# Patient Record
Sex: Female | Born: 1935 | Race: White | Hispanic: No | Marital: Married | Smoking: Never smoker
Health system: Southern US, Community
[De-identification: ages and names within clinical notes are randomized; demographics above are authoritative.]

## PROBLEM LIST (undated history)

## (undated) DIAGNOSIS — F0390 Unspecified dementia without behavioral disturbance: Secondary | ICD-10-CM

## (undated) DIAGNOSIS — I10 Essential (primary) hypertension: Secondary | ICD-10-CM

## (undated) DIAGNOSIS — E7800 Pure hypercholesterolemia, unspecified: Secondary | ICD-10-CM

## (undated) DIAGNOSIS — G4733 Obstructive sleep apnea (adult) (pediatric): Secondary | ICD-10-CM

## (undated) DIAGNOSIS — G20A1 Parkinson's disease without dyskinesia, without mention of fluctuations: Secondary | ICD-10-CM

## (undated) DIAGNOSIS — M549 Dorsalgia, unspecified: Secondary | ICD-10-CM

## (undated) DIAGNOSIS — G20 Parkinson's disease: Secondary | ICD-10-CM

## (undated) HISTORY — DX: Essential (primary) hypertension: I10

## (undated) HISTORY — DX: Dorsalgia, unspecified: M54.9

## (undated) HISTORY — DX: Pure hypercholesterolemia, unspecified: E78.00

## (undated) HISTORY — PX: LUMBAR SPINE SURGERY: SHX701

## (undated) HISTORY — DX: Parkinson's disease without dyskinesia, without mention of fluctuations: G20.A1

## (undated) HISTORY — PX: OTHER SURGICAL HISTORY: SHX169

## (undated) HISTORY — DX: Parkinson's disease: G20

## (undated) HISTORY — PX: SHOULDER SURGERY: SHX246

## (undated) HISTORY — DX: Obstructive sleep apnea (adult) (pediatric): G47.33

## (undated) HISTORY — PX: CERVICAL SPINE SURGERY: SHX589

---

## 1998-02-16 ENCOUNTER — Ambulatory Visit (HOSPITAL_COMMUNITY): Admission: RE | Admit: 1998-02-16 | Discharge: 1998-02-16 | Payer: Self-pay | Admitting: Endocrinology

## 1998-10-06 ENCOUNTER — Ambulatory Visit (HOSPITAL_COMMUNITY): Admission: RE | Admit: 1998-10-06 | Discharge: 1998-10-06 | Payer: Self-pay | Admitting: Endocrinology

## 1998-10-06 ENCOUNTER — Encounter: Payer: Self-pay | Admitting: Endocrinology

## 1998-10-12 ENCOUNTER — Ambulatory Visit (HOSPITAL_COMMUNITY): Admission: RE | Admit: 1998-10-12 | Discharge: 1998-10-12 | Payer: Self-pay | Admitting: *Deleted

## 1998-10-14 ENCOUNTER — Other Ambulatory Visit: Admission: RE | Admit: 1998-10-14 | Discharge: 1998-10-14 | Payer: Self-pay | Admitting: Endocrinology

## 1998-11-25 ENCOUNTER — Observation Stay (HOSPITAL_COMMUNITY): Admission: RE | Admit: 1998-11-25 | Discharge: 1998-11-27 | Payer: Self-pay | Admitting: Orthopedic Surgery

## 1998-12-18 ENCOUNTER — Encounter: Payer: Self-pay | Admitting: Emergency Medicine

## 1998-12-18 ENCOUNTER — Encounter: Payer: Self-pay | Admitting: Neurosurgery

## 1998-12-18 ENCOUNTER — Inpatient Hospital Stay (HOSPITAL_COMMUNITY): Admission: EM | Admit: 1998-12-18 | Discharge: 1999-01-10 | Payer: Self-pay | Admitting: Emergency Medicine

## 1998-12-19 ENCOUNTER — Encounter: Payer: Self-pay | Admitting: Emergency Medicine

## 1998-12-19 ENCOUNTER — Encounter: Payer: Self-pay | Admitting: Neurosurgery

## 1998-12-21 ENCOUNTER — Encounter: Payer: Self-pay | Admitting: Neurosurgery

## 1998-12-28 ENCOUNTER — Encounter: Payer: Self-pay | Admitting: Neurosurgery

## 1998-12-29 ENCOUNTER — Encounter: Payer: Self-pay | Admitting: Neurosurgery

## 1999-01-08 ENCOUNTER — Encounter: Payer: Self-pay | Admitting: Neurosurgery

## 1999-10-26 ENCOUNTER — Encounter: Payer: Self-pay | Admitting: Endocrinology

## 1999-10-26 ENCOUNTER — Ambulatory Visit (HOSPITAL_COMMUNITY): Admission: RE | Admit: 1999-10-26 | Discharge: 1999-10-26 | Payer: Self-pay | Admitting: Endocrinology

## 1999-11-14 ENCOUNTER — Other Ambulatory Visit: Admission: RE | Admit: 1999-11-14 | Discharge: 1999-11-14 | Payer: Self-pay | Admitting: Endocrinology

## 2000-07-18 ENCOUNTER — Encounter: Payer: Self-pay | Admitting: Orthopedic Surgery

## 2000-07-18 ENCOUNTER — Encounter: Admission: RE | Admit: 2000-07-18 | Discharge: 2000-07-18 | Payer: Self-pay | Admitting: Orthopedic Surgery

## 2000-08-01 ENCOUNTER — Ambulatory Visit (HOSPITAL_COMMUNITY): Admission: RE | Admit: 2000-08-01 | Discharge: 2000-08-01 | Payer: Self-pay | Admitting: Orthopedic Surgery

## 2000-08-01 ENCOUNTER — Encounter: Payer: Self-pay | Admitting: Orthopedic Surgery

## 2000-12-11 ENCOUNTER — Other Ambulatory Visit: Admission: RE | Admit: 2000-12-11 | Discharge: 2000-12-11 | Payer: Self-pay | Admitting: Endocrinology

## 2002-04-02 ENCOUNTER — Ambulatory Visit (HOSPITAL_COMMUNITY): Admission: RE | Admit: 2002-04-02 | Discharge: 2002-04-02 | Payer: Self-pay | Admitting: Endocrinology

## 2002-04-02 ENCOUNTER — Encounter: Payer: Self-pay | Admitting: Endocrinology

## 2002-07-02 ENCOUNTER — Encounter: Payer: Self-pay | Admitting: Orthopedic Surgery

## 2002-07-02 ENCOUNTER — Encounter: Admission: RE | Admit: 2002-07-02 | Discharge: 2002-07-02 | Payer: Self-pay | Admitting: Orthopedic Surgery

## 2002-12-01 ENCOUNTER — Encounter: Payer: Self-pay | Admitting: Orthopedic Surgery

## 2002-12-03 ENCOUNTER — Inpatient Hospital Stay (HOSPITAL_COMMUNITY): Admission: RE | Admit: 2002-12-03 | Discharge: 2002-12-06 | Payer: Self-pay | Admitting: Orthopedic Surgery

## 2002-12-03 ENCOUNTER — Encounter: Payer: Self-pay | Admitting: Orthopedic Surgery

## 2003-04-28 ENCOUNTER — Ambulatory Visit (HOSPITAL_COMMUNITY): Admission: RE | Admit: 2003-04-28 | Discharge: 2003-04-28 | Payer: Self-pay | Admitting: Endocrinology

## 2003-04-28 ENCOUNTER — Encounter: Payer: Self-pay | Admitting: Endocrinology

## 2003-06-03 ENCOUNTER — Ambulatory Visit (HOSPITAL_COMMUNITY): Admission: RE | Admit: 2003-06-03 | Discharge: 2003-06-03 | Payer: Self-pay | Admitting: *Deleted

## 2003-06-03 ENCOUNTER — Encounter (INDEPENDENT_AMBULATORY_CARE_PROVIDER_SITE_OTHER): Payer: Self-pay | Admitting: Specialist

## 2003-06-24 ENCOUNTER — Encounter: Admission: RE | Admit: 2003-06-24 | Discharge: 2003-06-24 | Payer: Self-pay | Admitting: Orthopedic Surgery

## 2003-12-23 ENCOUNTER — Encounter: Admission: RE | Admit: 2003-12-23 | Discharge: 2003-12-23 | Payer: Self-pay | Admitting: Orthopedic Surgery

## 2004-03-17 ENCOUNTER — Ambulatory Visit (HOSPITAL_COMMUNITY): Admission: RE | Admit: 2004-03-17 | Discharge: 2004-03-17 | Payer: Self-pay | Admitting: Specialist

## 2004-03-25 ENCOUNTER — Encounter: Admission: RE | Admit: 2004-03-25 | Discharge: 2004-03-25 | Payer: Self-pay | Admitting: Endocrinology

## 2004-04-07 ENCOUNTER — Encounter (INDEPENDENT_AMBULATORY_CARE_PROVIDER_SITE_OTHER): Payer: Self-pay | Admitting: *Deleted

## 2004-04-07 ENCOUNTER — Ambulatory Visit: Admission: RE | Admit: 2004-04-07 | Discharge: 2004-04-07 | Payer: Self-pay | Admitting: Internal Medicine

## 2004-05-06 ENCOUNTER — Ambulatory Visit (HOSPITAL_COMMUNITY): Admission: RE | Admit: 2004-05-06 | Discharge: 2004-05-06 | Payer: Self-pay | Admitting: Endocrinology

## 2004-05-11 ENCOUNTER — Encounter: Admission: RE | Admit: 2004-05-11 | Discharge: 2004-05-11 | Payer: Self-pay | Admitting: Orthopedic Surgery

## 2004-07-18 ENCOUNTER — Inpatient Hospital Stay (HOSPITAL_COMMUNITY): Admission: RE | Admit: 2004-07-18 | Discharge: 2004-07-19 | Payer: Self-pay | Admitting: Orthopedic Surgery

## 2004-10-14 ENCOUNTER — Encounter: Admission: RE | Admit: 2004-10-14 | Discharge: 2004-10-14 | Payer: Self-pay | Admitting: Orthopedic Surgery

## 2005-04-21 ENCOUNTER — Encounter: Admission: RE | Admit: 2005-04-21 | Discharge: 2005-04-21 | Payer: Self-pay | Admitting: Orthopedic Surgery

## 2005-05-12 ENCOUNTER — Ambulatory Visit (HOSPITAL_COMMUNITY): Admission: RE | Admit: 2005-05-12 | Discharge: 2005-05-12 | Payer: Self-pay | Admitting: Endocrinology

## 2006-01-12 ENCOUNTER — Encounter: Admission: RE | Admit: 2006-01-12 | Discharge: 2006-01-12 | Payer: Self-pay | Admitting: Orthopedic Surgery

## 2006-07-17 ENCOUNTER — Ambulatory Visit (HOSPITAL_COMMUNITY): Admission: RE | Admit: 2006-07-17 | Discharge: 2006-07-17 | Payer: Self-pay | Admitting: *Deleted

## 2006-07-17 ENCOUNTER — Encounter (INDEPENDENT_AMBULATORY_CARE_PROVIDER_SITE_OTHER): Payer: Self-pay | Admitting: *Deleted

## 2006-11-20 ENCOUNTER — Encounter: Admission: RE | Admit: 2006-11-20 | Discharge: 2006-11-20 | Payer: Self-pay | Admitting: Orthopedic Surgery

## 2007-10-04 ENCOUNTER — Ambulatory Visit (HOSPITAL_COMMUNITY): Admission: RE | Admit: 2007-10-04 | Discharge: 2007-10-04 | Payer: Self-pay | Admitting: Neurosurgery

## 2007-10-29 ENCOUNTER — Inpatient Hospital Stay (HOSPITAL_COMMUNITY): Admission: RE | Admit: 2007-10-29 | Discharge: 2007-11-01 | Payer: Self-pay | Admitting: Neurosurgery

## 2007-10-31 ENCOUNTER — Ambulatory Visit: Payer: Self-pay | Admitting: Physical Medicine & Rehabilitation

## 2007-11-01 ENCOUNTER — Inpatient Hospital Stay (HOSPITAL_COMMUNITY)
Admission: RE | Admit: 2007-11-01 | Discharge: 2007-11-12 | Payer: Self-pay | Admitting: Physical Medicine & Rehabilitation

## 2010-09-18 ENCOUNTER — Encounter: Payer: Self-pay | Admitting: Cardiology

## 2010-09-18 ENCOUNTER — Encounter: Payer: Self-pay | Admitting: Neurosurgery

## 2011-01-05 ENCOUNTER — Emergency Department (HOSPITAL_COMMUNITY)
Admission: EM | Admit: 2011-01-05 | Discharge: 2011-01-05 | Disposition: A | Discharge status: Home / Self Care | Payer: Medicare Other | Attending: Emergency Medicine | Admitting: Emergency Medicine

## 2011-01-05 ENCOUNTER — Emergency Department (HOSPITAL_COMMUNITY): Payer: Medicare Other

## 2011-01-05 DIAGNOSIS — W108XXA Fall (on) (from) other stairs and steps, initial encounter: Secondary | ICD-10-CM | POA: Insufficient documentation

## 2011-01-05 DIAGNOSIS — W050XXA Fall from non-moving wheelchair, initial encounter: Secondary | ICD-10-CM | POA: Insufficient documentation

## 2011-01-05 DIAGNOSIS — R51 Headache: Secondary | ICD-10-CM | POA: Insufficient documentation

## 2011-01-05 DIAGNOSIS — S0180XA Unspecified open wound of other part of head, initial encounter: Secondary | ICD-10-CM | POA: Insufficient documentation

## 2011-01-05 DIAGNOSIS — G20A1 Parkinson's disease without dyskinesia, without mention of fluctuations: Secondary | ICD-10-CM | POA: Insufficient documentation

## 2011-01-05 DIAGNOSIS — Z79899 Other long term (current) drug therapy: Secondary | ICD-10-CM | POA: Insufficient documentation

## 2011-01-05 DIAGNOSIS — Y92009 Unspecified place in unspecified non-institutional (private) residence as the place of occurrence of the external cause: Secondary | ICD-10-CM | POA: Insufficient documentation

## 2011-01-05 DIAGNOSIS — S12100A Unspecified displaced fracture of second cervical vertebra, initial encounter for closed fracture: Secondary | ICD-10-CM | POA: Insufficient documentation

## 2011-01-05 DIAGNOSIS — M542 Cervicalgia: Secondary | ICD-10-CM | POA: Insufficient documentation

## 2011-01-05 DIAGNOSIS — G20 Parkinson's disease: Secondary | ICD-10-CM | POA: Insufficient documentation

## 2011-01-05 DIAGNOSIS — E789 Disorder of lipoprotein metabolism, unspecified: Secondary | ICD-10-CM | POA: Insufficient documentation

## 2011-01-10 NOTE — Op Note (Signed)
Kathleen Haynes, TURSKI NO.:  [id number]   MEDICAL RECORD NO.:  [id number]          PATIENT TYPE:  INP   LOCATION:  3103                         FACILITY:  [facility]   PHYSICIAN:  Danae Orleans. Venetia Maxon, M.D.  DATE OF BIRTH:  May 23, 1927   DATE OF PROCEDURE:  10/29/2007  DATE OF DISCHARGE:                               OPERATIVE REPORT   PREOPERATIVE DIAGNOSIS:  Scoliosis, spondylolysis, spondylolisthesis,  status post prior decompressive laminectomy, and lumbar radiculopathy L3  through S1 levels.   POSTOPERATIVE DIAGNOSIS:  Scoliosis, spondylolysis, spondylolisthesis,  status post prior decompressive laminectomy, and lumbar radiculopathy L3  through S1 levels.   PROCEDURES:  1. Redo decompression, L3 through S1.  2. Transforaminal lumbar interbody fusion, L5-S1, with PEEK interbody      cage, morcellized autograft bone, morphogenic protein and Fortoss.  3. Pedicle screw fixation bilaterally, L3 through S1, using segmental      instrumentation.  4. Posterolateral arthrodesis with Fortoss, autograft and BMP   SURGEON:  Danae Orleans. Venetia Maxon, M.D.   ASSISTANT:  Georgiann Cocker, RN   ANESTHESIA:  General endotracheal anesthesia.   ESTIMATED BLOOD LOSS:  400 mL with 125 mL of Cell Saver blood returned  to the patient.   COMPLICATIONS:  None.   DISPOSITION:  To recovery.   INDICATIONS:  Tresea Heine is a 77 year old woman with lumbar  scoliosis, spondylolysis of L5, spondylolisthesis of L5 on S1 grade 2,  with severe right L5 radiculopathy.  With recurrent stenosis, she has  previously undergone decompressive laminectomy by another physician and  has spondylosis, stenosis and scoliosis as well as recurrent nerve root  compression.  It was elected take her surgery for decompression and  fusion.   PROCEDURE:  Ms. Bridgers was brought to the operating room.  Following  satisfactory and uncomplicated induction of general endotracheal  anesthesia and placement of intravenous lines  and Foley catheter with  arterial line, she was placed in a prone position on the [place] table.  Her low back was then prepped and draped in the usual sterile fashion.  Previous incision was ellipsed out and exposure was carried to the L3  transverse processes, L4, L5 transverse processes and the sacral alae,  and retraction was performed with the cerebellar retractors.  The  previous densely scarred-in prior laminectomy defect was re-explored  after confirmatory x-ray confirmed correct orientation with marker  probes at each of these bony prominences.  There was spondylolysis of L5  and using laminar spreaders it was possible to gain access to the  pedicles of L5 and S1 and to reopen this interspace and under loupe  magnification and painstaking dissection through scar tissue, the right  L5 nerve root was identified and decompressed of dense scar and  ligamentous tissue.  The nerve root appeared to have been severely  compressed by a combination of the grade 2 spondylolisthesis as well as  herniated disk material and with decompression, there appeared to be  significantly increased room for this nerve to exit the spinal canal.  The foramen was totally unroofed and then the interspace at L5-S1 was  incised with  a 15 blade and disk material was removed in a vigorous  fashion, stripping cartilaginous material from the endplates of L5 and  S1 and removing the herniated disk material to totally decompress the  nerve roots.  After trial sizing an 8-mm medium PEEK interbody cage was  selected, packed with half of a medium strip of BMP.  It was then packed  with Fortoss overlying it.  Then the additional half stripped of BMP was  placed within the interspace and additional Fortoss was placed overlying  this.  The implant was then placed in the interspace and countersunk  appropriately.  There appeared to be significant decompression along the  nerve roots with this implant.  Additional bone  graft and Fortoss was  placed overlying the implant and this was tamped in position.  The  further laminectomy was then performed, particularly on the right, to  decompress the L3, L4, L5 nerve roots along the lateral recess.  Subsequently pedicle screw fixation was placed using 6.5 x 40-mm screws  at the sacrum and 5.5 x by ranging from 40 to 50-mm screws at L5, L4 and  L3 levels.  Prior to placing the screws the posterolateral region was  decorticated and after placing these screws and confirming their  positioning on AP and lateral fluoroscopy, the remaining BMP was placed  in the posterolateral region and additional Fortoss and bone autograft  was placed overlying this.  The 70-mm prelordosed rods were fashioned to  fit these screw heads, locked down in situ, and the self-retaining  retractor removed and the incision closed with Vicryl subcuticular  stitch.  The wound was dressed with Benzoin, Steri-Strips, Telfa gauze  and tape.  The patient was extubated in the operating room taken to  recovery having tolerated the procedure well.  Counts were correct at  the end of the case.      Danae Orleans. Venetia Maxon, M.D.  Electronically Signed     JDS/MEDQ  D:  10/29/2007  T:  10/30/2007  Job:  [id number]   cc:   Georgiann Cocker, RN

## 2011-01-10 NOTE — Discharge Summary (Signed)
NAMEVALARIA, KOHUT NO.:  [id number]   MEDICAL RECORD NO.:  [id number]          PATIENT TYPE:  IPS   LOCATION:  4032                         FACILITY:  [facility]   PHYSICIAN:  Ellwood Dense, M.D.   DATE OF BIRTH:  [birth year]   DATE OF ADMISSION:  11/01/2007  DATE OF DISCHARGE:  11/12/2007                               DISCHARGE SUMMARY   DISCHARGE DIAGNOSES:  1. Redo lumbar L3-S1 decompression fusion for spondylosis and      radiculopathy October 29, 2007.  2. History of lumbar back surgery x2.  3. Parkinson's disease.  4. Hypertension.  5. Hyperlipidemia.  6. Pain management.   This is a 77 year old white female with history of Parkinson's disease,  lumbar back surgery in April 2004 as well as a right lateral  decompression in 2005, who was admitted on October 29, 2007 with increased  low back pain.  X-rays and imaging showed scoliosis, spondylosis, and  lumbar radiculopathy.  Underwent redo L3-S1, redo decompression lumbar  interbody fusion, lumbar L5-S1 with pedicle screw fixation bilaterally  L3-S1 on October 29, 2007 per Dr. Venetia Maxon.  PCA discontinued October 31, 2007.  Noted brace when out of bed.   PAST MEDICAL HISTORY:  See discharge diagnoses.   No alcohol or tobacco.   ALLERGIES:  Sulfa and Biaxin.   SOCIAL HISTORY:  She lives with her husband.  Assistance is needed.  Good local family support, one-level home, four steps to entry.   MEDICATIONS:  Prior to admission were Darvocet as needed, calcium twice  daily, Hygroton 50-25 daily, potassium chloride 20 mEq daily, Detrol 4  mg daily, Crestor 10 mg daily, Sinemet 50-200 mg twice daily, ReQuip 2  mg daily, and Zantac over the counter.   REHABILITATION AND HOSPITAL COURSE:  The patient was admitted to  inpatient rehab services with therapies initiated on a 3-hour daily  basis consisting of physical therapy, occupational therapy, and  rehabilitation nursing.  The following issues were addressed during  patient's rehabilitation stay.  Pertaining to Ms. Rohner's redo lumbar  back surgery, lumbar L3-S1 for scoliosis radiculopathy on October 29, 2007.  Surgical site healing nicely.  No signs of infection.  She is wearing a  back brace when out of bed.  She had undergone lumbar back surgery x2 in  the past.  Functionally, she was minimal assist for car transfers,  ambulating minimal assist 50 feet with a rolling walker, minimal assist  transfers, minimal assist for lower body activities of daily living,  supervision for upper body in the sitting position.  She was continent  for bowel and bladder.  She was using Vicodin on limited basis for pain.  She did have some subtle complaints of low back discomfort.  Pelvis  films to rule out any degenerative changes of the SI joint, were  negative for acute findings.  No fracture noted.  She remained on her  Sinemet and ReQuip for Parkinson's disease, I wish she would follow up  with Dr. Sandria Manly.  Blood pressure is well controlled.  She was maintained  on Hygroton.  She remained on her Crestor  for hyperlipidemia.  She was  using Flexeril on a limited basis for some muscle spasms.  Her calves  remained cool without any swelling, erythema, and nontender.   Latest lab showed a sodium 138, potassium 3.1 with supplement added, BUN  19, creatinine of 0.8, hemoglobin of 9, hematocrit 25.6, and platelet  331,000.   DISCHARGE MEDICATIONS:  At the time of dictation included Crestor 10 mg  daily, Detrol 4 mg daily, ReQuip 2 mg daily, Hygroton 25 mg daily, Os-  Cal twice daily, Sinemet 50/200 mg 1 tablet every 12 hours, Trinsicon 1  capsule twice daily, Tenoretic 20-25 daily, Protonix 40 mg daily,  Vicodin 5-325 one or two tablets every 4 hours as needed pain, dispensed  with 60 tablets, Flexeril 10 mg every 6 hours as needed for muscle  spasms.  Diet was regular.   SPECIAL INSTRUCTIONS:  Continue back brace when out of bed.  Follow up  with Dr. Maeola Harman on  [phone], Neurosurgery.  Follow up with Dr.  Juleen China, medical management.      Mariam Dollar, P.A.    ______________________________  Ellwood Dense, M.D.    DA/MEDQ  D:  11/12/2007  T:  11/12/2007  Job:  [id number]   cc:   Danae Orleans. Venetia Maxon, M.D.  Genene Churn. Love, M.D.  Georgiana Spinner, M.D.  Ellwood Dense, M.D.  Brooke Bonito, M.D.

## 2011-01-10 NOTE — H&P (Signed)
Kathleen Haynes, BASTEDO NO.:  [id number]   MEDICAL RECORD NO.:  [id number]          PATIENT TYPE:  IPS   LOCATION:  4142                         FACILITY:  [facility]   PHYSICIAN:  Ellwood Dense, M.D.   DATE OF BIRTH:  1926-09-06   DATE OF ADMISSION:  11/01/2007  DATE OF DISCHARGE:                              HISTORY & PHYSICAL   HISTORY AND PHYSICAL ADMIT NOTE TO REHABILITATION UNIT   ADMITTING PHYSICIAN:  Unknown.   PRIMARY CARE PHYSICIAN:  Brooke Bonito, M.D.   NEUROSURGEONDanae Orleans. Venetia Maxon, M.D.   NEUROLOGIST:  Genene Churn. Love, M.D.   GASTROENTEROLOGY:  Georgiana Spinner, M.D.   HISTORY OF PRESENT ILLNESS:  [name] is a 77 year old Caucasian female  with history of Parkinson's disease.  She had a prior L2-L5  decompressive laminectomy in April 2004, as well as a right lateral  decompression at L2-3 and L3-L4 in November 2005.  The patient was  admitted October 29, 2007 with increased low back pain.  X-rays showed  scoliosis along with spondylosis and lumbar radiculopathy.  The patient underwent redo L3-S1 decompressive laminectomy with  interbody fusion, L5-S1 using pedicle screw fixation.  That was per  performed by Dr. Venetia Maxon October 29, 2007.  A PCA pump was discontinued October 31, 2007.  She has a brace when out of bed and don and doff that brace at  bedside.   The patient was evaluated by the rehabilitation physicians and felt to  be an appropriate candidate for inpatient rehabilitation.   REVIEW OF SYSTEMS:  Positive for reflux.   PAST MEDICAL HISTORY:  1. Parkinson's disease.  2. Hypertension.  3. Dyslipidemia.  4. Back surgery in 2004 and 2005.  5. Left patellar fracture December 2008.   FAMILY HISTORY:  Positive for coronary artery disease.   SOCIAL HISTORY:  The patient lives with her husband who can assist as  needed.  There is good local family support.  They live in a one-level  home with four steps to enter.  The patient does not use alcohol or  tobacco.   FUNCTIONAL HISTORY PRIOR TO ADMISSION:  Independent, using a walker for  the last several months.   ALLERGIES:  BIAXIN, SULFA.   MEDICATION PRIOR TO ADMISSION:  1. Darvocet p.r.n.  2. Calcium b.i.d.  3. Hygroton 50/25 1 tablet daily.  4. Potassium chloride 20 mEq daily.  5. Detrol LA 4 mg daily.  6. Crestor 10 mg daily.  7. Sinemet 50/200 b.i.d.  8. Requip 2 mg q.i.d. daily.   LABORATORY:  Recent hemoglobin was 12.2, hematocrit 35.5, platelet count  of 229,000, white count of 4.8.  Recent sodium is 138, potassium 3.6,  chloride 103, bicarbonate 25, BUN 26 and creatinine 1.06.   PHYSICAL EXAM:  GENERAL:  Well-appearing, thin, elderly female, up using  a walker with her brace on.  VITAL SIGNS:  Blood pressure is 110/70 with pulse 80, respiratory rate  20 and temperature 98.0.  HEENT:  Normocephalic, nontraumatic.  CARDIOVASCULAR:  Regular rate and rhythm, S1-S2 without murmurs.  ABDOMEN:  Soft, nontender with positive  bowel sounds.  LUNGS:  Clear to auscultation bilaterally.  NEUROLOGIC:  Alert and oriented x3.  Cranial nerves II-XII are intact.  Bilateral upper extremity exam showed 4-/5 strength throughout.  Bulk,  tone were normal.  Reflexes 2+ and symmetrical.  Lower extremity exam  showed 4-/5 strength in hip flexion/extension, ankle dorsiflexion.  The  patient has a well-healing lumbar wound without drainage.   IMPRESSION:  1. Status post redo L3 - S1 decompression, with fusion for spondylosis      with myelopathy, performed by Dr. Venetia Maxon October 29, 2007.  2. History of lumbar back surgery x2.  3. Parkinson's disease on Sinemet and Requip.  4. Hypertension on Tenormin and Hygroton.  5. Dyslipidemia.   Presently, the patient has deficits in ADLs, transfers, and ambulation  related to the above-noted lumbar surgery.   PLAN:  1. Admit to the rehabilitation of daily therapies to include physical      therapy for range of motion, strengthening, bed mobility,       transfers, pre-gait training, gait training and equipment eval.  2. Occupational therapy for range of motion, strengthening, ADLs,      cognitive/perceptual training, splinting and equipment eval, rehab      nursing for skin care, wound care and bound bladder training as      necessary.  3. Case management to assess home environment, assist with discharge      planning, and arrange for appropriate follow-up care.  4. Social work to assess family and social support and assist in      discharge planning.  5. Check admission labs including CBC and CMET Monday November 04, 2007.      Continue regular diet.  6. Back brace when out of bed and may don and doff at the edge of the      bed.  7. Senokot-S 2 tablets p.o. q.h.s.  8. Zocor 40 mg p.o. daily.  9. Detrol LA 4 mg p.o. q.  10.Potassium chloride 20 mEq p.o. daily.  11.Sinemet CR 50/200 1 tablet b.i.d. at 1000 and 2200 daily.  12.Os-Cal with vitamin D 5 mg p.o. b.i.d.  13.Requip 2 mg p.o. daily.  14.Tenormin 50 mg p.o. daily.  15.Hygroton 25 mg p.o. daily.  16.Discontinue IV fluids if not already done.  17.Flexeril 10 mg p.o. q.6 h p.r.n. for pain.  18.Vicodin 5/500 1-2 tablets p.o. q.4 h. p.r.n. for pain.   PROGNOSIS:  Good.   ESTIMATED LENGTH OF STAY:  Seven to twelve days.   GOALS:  Modified dependent, ADLs, transfers and standby assist for  ambulation.           ______________________________  Ellwood Dense, M.D.     DC/MEDQ  D:  11/01/2007  T:  11/02/2007  Job:  [id number]

## 2011-01-13 NOTE — Op Note (Signed)
NAMEKINGSLEE, DOWSE NO.:  [id number]   MEDICAL RECORD NO.:  [id number]          PATIENT TYPE:  AMB   LOCATION:  ENDO                         FACILITY:  [facility]   PHYSICIAN:  Georgiana Spinner, M.D.    DATE OF BIRTH:  14-Jun-1927   DATE OF PROCEDURE:  07/17/2006  DATE OF DISCHARGE:                                 OPERATIVE REPORT   PROCEDURE:  Upper endoscopy.   INDICATIONS:  GERD.   ANESTHESIA:  Demerol 25 mg, Versed 3 mg.   PROCEDURE:  With the patient mildly sedated in the left lateral decubitus  position the Olympus videoscopic endoscope was inserted in the mouth and  passed under direct vision through the esophagus which appeared normal.  Distal esophagus was approached and photographs were taken. Biopsies taken  to the squamocolumnar junction.  We entered into the stomach. Fundus, body,  antrum, duodenal bulb, second portion duodenum were visualized. From this  point the endoscope was slowly withdrawn taking circumferential views of  duodenal mucosa until the endoscope had been pulled back into the stomach  placed in retroflexion to view the stomach from below. The endoscope was  straightened and withdrawn taking circumferential views of the remaining  gastric and esophageal mucosa.  The patient's vital signs, pulse oximeter  remained stable.  The patient tolerated procedure well without  complications.   FINDINGS:  Loose wrap of the GE junction around the endoscope, biopsy of the  distal esophagus.  Await biopsy report.  The patient will call for results  and follow-up with me as an outpatient.  Proceed to colonoscopy as planned.           ______________________________  Georgiana Spinner, M.D.     GMO/MEDQ  D:  07/17/2006  T:  07/17/2006  Job:  [id number]

## 2011-01-13 NOTE — Op Note (Signed)
Kathleen Haynes, Kathleen Haynes NO.:  [id number]   MEDICAL RECORD NO.:  [id number]          PATIENT TYPE:  AMB   LOCATION:  ENDO                         FACILITY:  [facility]   PHYSICIAN:  Georgiana Spinner, M.D.    DATE OF BIRTH:  1927/01/12   DATE OF PROCEDURE:  07/17/2006  DATE OF DISCHARGE:                                 OPERATIVE REPORT   PROCEDURE:  Colonoscopy.   INDICATIONS:  Colon polyps.   ANESTHESIA:  Demerol 25 mg, Versed 2 mg.   PROCEDURE:  With the patient mildly sedated in the left lateral decubitus  position the Olympus videoscopic colonoscope was inserted into the rectum  and passed under direct vision to the cecum, identified by ileocecal valve  and appendiceal orifice both of which were photographed. From this point the  colonoscope was slowly withdrawn taking circumferential views of colonic  mucosa stopping only in the rectum which appeared normal on direct and  showed hemorrhoids and a small polyp on retroflexed view. The polyp was  removed using hot biopsy forceps technique setting of 20/200 blended  current.  The endoscope was straightened and withdrawn.  The patient's vital  signs, pulse oximeter remained stable.  The patient tolerated procedure well  without apparent complications.   FINDINGS:  Polyp of rectum and scattered diverticula throughout the colon,  mostly in the left side and internal hemorrhoids.  Await biopsy report.  The  patient will call me for results and follow-up with me as an outpatient.           ______________________________  Georgiana Spinner, M.D.     GMO/MEDQ  D:  07/17/2006  T:  07/17/2006  Job:  [id number]

## 2011-01-13 NOTE — Op Note (Signed)
[facility]  Patient:    [name], Kathleen Haynes                   MRN: [id number] Proc. Date: 08/02/00 Adm. Date:  [id number] Attending:  Marlowe Kays Page                           Operative Report  PREOPERATIVE DIAGNOSES:  Painful subluxed distal left clavicle status post rotator cuff repair.  POSTOPERATIVE DIAGNOSES:  Painful subluxed distal left clavicle status post rotator cuff repair.  OPERATION:  Open resection distal left clavicle.  SURGEON:  Illene Labrador. [place], M.D.  ASSISTANT:  Nurse.  ANESTHESIA:  General.  PATHOLOGY AND JUSTIFICATION FOR PROCEDURE:  She has had prior rotator cuff repair on the left shoulder with continued discomfort.  An arthrogram has demonstrated that the rotator cuff repair is intact but that the distal clavicle is subluxed inferiorly and has a large spur on the underneath surface and is probably indenting into the rotator cuff causing mechanical dysfunction.  Accordingly, she is here today for distal clavicle resection.  DESCRIPTION OF PROCEDURE:  Satisfactory general anesthesia, beach chair position on the Schlein frame.  The left shoulder was prepped with Duraprep and draped in a sterile field en bloc.  Ioban employed.  A short incision was made over the distal clavicle and with cautery, subperiosteal dissection performed exposing the distal clavicle and the lateral 2 cm.  I then undermined gently the distal clavicle and marked out about 1.5-2 cm for the resection line and with a microsaw, the resection was made.  I grasped the distal clavicle with tie clip.  I dissected it out with cutting cautery. Small remnants of bone were removed with rongeur, and the underneath surface of the distal clavicle was smoothed with rasps.  Bone wax was applied to the distal clavicle wall bone.  The wound was irrigated with sterile saline, and the defect caused by the resected clavicle packed with Gelfoam.  I then infiltrated the  soft tissues with 1/2% Marcaine with adrenalin and closed the fascia overlying the distal clavicle with #1 Vicryl, subcutaneous tissue with 2-0 Vicryl, and 4-0 Vicryl superficially with Steri-Strips on the skin.  Dry sterile dressing and shoulder immobilizer were applied.  She tolerated the procedure well and was transported to the [facility] with no known complications. DD:  08/02/00 TD:  08/02/00 Job: [id number]

## 2011-01-13 NOTE — Op Note (Signed)
NAME:  Kathleen Haynes, Kathleen Haynes                         ACCOUNT NO.:  [id number]   MEDICAL RECORD NO.:  [id number]                   PATIENT TYPE:  AMB   LOCATION:  CARD                                 FACILITY:  [facility]   PHYSICIAN:  Casimiro Needle B. Sherene Sires, M.D. [facility]           DATE OF BIRTH:  1926-11-22   DATE OF PROCEDURE:  04/07/2004  DATE OF DISCHARGE:                                 OPERATIVE REPORT   REFERRED. BY:  Brooke Bonito, M.D.   REASON FOR CONSULTATION:  Multiple lung nodules.   INDICATIONS FOR PROCEDURE:  Please see dictated pulmonary consultation notes  in the office records from yesterday.  This patient is a 77 year old white  female never smoker with incidentally noted multiple pulmonary nodules while  being evaluated by back surgery by routine chest x-ray and has minimal cough  as her only symptom.   She agreed to the procedure after a full discussion of the risks, benefits,  and alternatives.   The procedure performed in the bronchoscopy suite with continuous monitoring  ECG and oximetry. She maintained adequate saturations throughout the  procedure in sinus rhythm with adequate saturations on 4 liters per minute  nasal prongs.   She was initially treated with 1% lidocaine by updraft nebulizer, right  naris was additionally prepared with 2% lidocaine jelly.   Using a standard flexible video bronchoscope, the right naris was easily  cannulated with good visualization of the entire oropharynx and larynx. The  cords moved normally and there were no apparent upper airway lesions.   Using an additional 1% lidocaine as needed, the entire tracheobronchial tree  was explored with the following findings:   The trachea, carina and all the airways opened widely at the subsegmental  level bilaterally with no evidence of endobronchial process or excess  secretions.   DESCRIPTION OF PROCEDURE:  Using a transbronchial approach in the right  upper lobe posteriorly, I obtained four adequate  biopsies with minimal  bleeding.  A Wang biopsy was done at the level of the carina for sampling of  a node seen on CT scan anteriorly.  Biopsies done x2 with no bleeding.  Finally, lavage samples were obtained randomly for AFB and fungal stain and  culture as well as cytology.   She received a total of 25 mg of IV Demerol and 5 mg of IV Versed for  adequate sedation and cough suppression.   IMPRESSION:  Multiple pulmonary nodules undetermined etiology.   RECOMMENDATIONS:  Await tissue acknowledging that there may be a significant  sampling error in that the nodules appear discreet but on the other hand are  wide spread enough that we may have well obtained adequate tissue.  Otherwise VATS open biopsy will be needed.  Charlaine Dalton. Sherene Sires, M.D. [facility]    MBW/MEDQ  D:  04/07/2004  T:  04/08/2004  Job:  [id number]   cc:   Brooke Bonito, M.D.  [address]  [place]  [place] [address]  Fax: [phone]

## 2011-01-13 NOTE — Discharge Summary (Signed)
NAMEDELORIS, Kathleen NO.:  Haynes   MEDICAL RECORD NO.:  Haynes          PATIENT TYPE:  INP   LOCATION:  3103                         FACILITY:  [facility]   PHYSICIAN:  Danae Orleans. Venetia Maxon, M.D.  DATE OF BIRTH:  [birth year]   DATE OF ADMISSION:  10/29/2007  DATE OF DISCHARGE:  11/01/2007                               DISCHARGE SUMMARY   REASONS FOR ADMISSION:  1. Idiopathic scoliosis.  2. Lumbosacral spondylolisthesis.  3. Parkinson disease.  4. Hypertension.   FINAL DIAGNOSES:  1. Idiopathic scoliosis.  2. Lumbosacral spondylolisthesis.  3. Parkinson disease.  4. Hypertension.   HOSPITAL COURSE:  Kathleen Haynes is a 77 year old woman with right  ankle and low back pain, which is very severe.  She has a history of  Parkinson disease.  She has significant lumbar scoliosis with  spondylolisthesis.  It was elected to admit her to the hospital with  redo decompression, L3 through S1 levels with transforaminal lumbar  interbody fusion performed at L5-S1 with pedicle screw fixation and  posterolateral arthrodesis, L3 through S1 levels.  The patient is doing  well postoperatively, gradually mobilized.  Foley catheter is  discontinued.  The patient is complaining of some back pain, but no leg  pain.  She was transferred from intensive care to the floor, was doing  well on the sixth with good bilateral lower extremity strength and was  transferred to the inpatient rehabilitation service with inpatient  rehab.      Danae Orleans. Venetia Maxon, M.D.  Electronically Signed     JDS/MEDQ  D:  11/21/2007  T:  11/22/2007  Job:  [id number]

## 2011-01-13 NOTE — Op Note (Signed)
   NAME:  Kathleen Haynes, Kathleen Haynes                         ACCOUNT NO.:  [id number]   MEDICAL RECORD NO.:  [id number]                   PATIENT TYPE:  AMB   LOCATION:  ENDO                                 FACILITY:  [facility]   PHYSICIAN:  Georgiana Spinner, M.D.                 DATE OF BIRTH:  1926-12-31   DATE OF PROCEDURE:  DATE OF DISCHARGE:                                 OPERATIVE REPORT   PROCEDURE:  Upper endoscopy.   INDICATIONS FOR PROCEDURE:  Rectal bleeding.   ANESTHESIA:  Demerol 50, Versed 5 mg.   DESCRIPTION OF PROCEDURE:  With the patient mildly sedated in the left  lateral decubitus position, the Olympus videoscopic endoscope was inserted  in the mouth and passed under direct vision through the esophagus which  appeared normal into the stomach. The fundus appeared normal. The body and  antrum were visualized. In the body of the stomach was a small ulceration,  flat with a whitish base with surrounding mild erythema which was  photographed and biopsied. The antrum in the prepyloric area also showed a  small ulcer that was basically a crab like quality to it but flat and benign  appearing but was also biopsied. The duodenal bulb and second portion of the  duodenum appeared normal. From this point, the endoscope was slowly  withdrawn taking circumferential views of the duodenal mucosa until the  endoscope was then pulled back in the stomach, placed in retroflexion to  view the stomach from below. The endoscope was then straightened and  withdrawn taking circumferential views of the remaining gastric and  esophageal mucosa. The patient's vital signs and pulse oximeter remained  stable. The patient tolerated the procedure well without apparent  complications.   FINDINGS:  Two small ulcerations as noted biopsied. Await biopsy report. The  patient will call me for results and followup with me as an outpatient.                                               Georgiana Spinner, M.D.    GMO/MEDQ  D:  06/03/2003  T:  06/03/2003  Job:  [id number]

## 2011-01-13 NOTE — Op Note (Signed)
Kathleen Haynes, Kathleen Haynes               ACCOUNT NO.:  [id number]   MEDICAL RECORD NO.:  [id number]          PATIENT TYPE:  INP   LOCATION:  0477                         FACILITY:  [facility]   PHYSICIAN:  Marlowe Kays, M.D.  DATE OF BIRTH:  1927-01-07   DATE OF PROCEDURE:  07/18/2004  DATE OF DISCHARGE:                                 OPERATIVE REPORT   PREOPERATIVE DIAGNOSIS:  Recurrent lateral recess stenosis and herniated  nucleus pulposus, L3-4, right.   POSTOPERATIVE DIAGNOSIS:  Recurrent spinal stenosis, L3-4, right.   OPERATION:  Right lateral decompression, L2-3, L3-4, right, with inspection  of the L3-4 disk.   SURGEON:  Georges Lynch. Darrelyn Hillock, M.D.   ANESTHESIA:  General.   INDICATIONS FOR PROCEDURE:  She has had a previous decompression from L2 to  the sacrum.  Has spondylolisthesis of L5-S1.  She has had recurrent right  leg pain with a myelogram CT scan demonstrating a good-sized extra-dural  defect of L3-4 on the right on the oblique films with a CT scan showing bony  impingement into the sac and what appeared to be a broad-based disk bulge at  L3-4.  Dr. Darrelyn Hillock and I agreed that her main problem, both on review of the  CT scan at surgery and intraoperatively was bony compression.  We did  evaluate the L3-4 disk, as noted below, and it seemed to be firm.   PROCEDURE:  Prophylactic antibiotics, satisfactory general anesthesia.  Prone position on the Wilson frame.  The back was prepped with Duraprep and  draped in a sterile field.  I started out at the superior portion of the old  incision, identifying what was felt was the spinous process of L2.  I took  an initial x-ray, tagging this with bone with a Kocher clamp and then  placing Penfield 4 somewhat distally.  The stump confirmed that the clamp  was indeed on the spinous process of L2.  Hence, the distal Penfield 4 was  lying somewhat above the L3 disk.  Based on this, we continued dissection on  the right side, working  distally.  I dissected some of the soft tissue off  the left side of the lamina of L2 so we could place a self-retaining  McCullough retractor.  Also used a cerebellar retractor distally.  Kept  working along the previous bony margin, working down to what we thought was  the L4, and we took a second x-ray with markers, indicating the level of the  L3-4 disk.  We were actually somewhat past the area of compression, which  went along with our intraoperative analysis.  Working off of L2, used 2 mm  Kerrison rongeur, Penfield 4, patties to work the dura off the lateral bone.  Also used a bur to pin down the lateral bone and between the two, we were  able to gradually free up the lateral recess and the dura.  As I noted  above, we found significant bony compression at the L3-4 level.  We  thoroughly decompressed from L2 to L4, inspected the L3-4 disk.  This was a  little hazardous because  of dural adhesions, but I was able to use a  Penfield 4 to identify the disk, which was firm.  With the L3-4 nerve roots  well decompressed, and previously had been felt to be compressed, and wide  decompression laterally, we felt that we had concluded the goals of the  operation, particularly in view of the fact there had been no dural tear.  We then irrigated the wound well.  Gelfoam was placed along the lateral  gutter and dura.  Self-retaining retractors were removed.  There was no  unusual bleeding.  Closed the fascia with interrupted  #1 Vicryl, subcutaneous tissue with 2-0 Vicryl, and skin with staples.  Betadine, Adaptic, and dry sterile dressing were applied.  She was gently  placed on her PACU bed and taken to the recovery room in satisfactory  condition with no known complications.  Estimated blood loss was 200 cc with  no blood replacement.      JA/MEDQ  D:  07/18/2004  T:  07/18/2004  Job:  [id number]

## 2011-01-13 NOTE — H&P (Signed)
Kathleen Haynes, Kathleen Haynes               ACCOUNT NO.:  [id number]   MEDICAL RECORD NO.:  [id number]          PATIENT TYPE:  INP   LOCATION:  NA                           FACILITY:  [facility]   PHYSICIAN:  Marlowe Kays, M.D.  DATE OF BIRTH:  December 04, 1926   DATE OF ADMISSION:  07/18/2004  DATE OF DISCHARGE:                                HISTORY & PHYSICAL   CHIEF COMPLAINT:  Pain in my back, but mostly in my right leg.   HISTORY OF PRESENT ILLNESS:  This 77 year old white female is seen today on  July 15, 2004, for a preoperative history and physical prior to her  surgery planned for July 18, 2004.  This lady is having continuing and  persistent problems concerning pain into her right lower extremity.  She has  been treated with epidural steroid injections, ____________ 75 mg b.i.d.  She has had bone scans showing no metastatic disease.  She has had a  pulmonary clearance from Dr. Sherene Sires.  She also states that Dr. Juleen China is aware  of her upcoming surgery.  Due to persistent pain and the fact that she is a  very active lady in her church as well as with her family, and now must use  a walker for ambulation, it is felt she would benefit from surgical  intervention.  Myelogram CT scan shows an large extradural defect at L3-4 on  the right.  Due to this patient's positive findings and the fact she has not  responded to conservative care, including epidural steroid injections, it is  felt she would benefit with a decompression and microdiskectomy at L3-4 on  the right.   ALLERGIES:  No known drug allergies.   PRIMARY CARE PHYSICIAN:  Dr. Adela Lank is her family physician.   CURRENT MEDICATIONS:  1.  Potassium chloride USP 20 mEq one daily.  2.  Carbidopa/Levo 50/200 mg b.i.d.  3.  Requip 31 mg one tab t.i.d.  4.  Atenolol/Chlorethyl 50/25 mg one tablet daily.   PAST MEDICAL HISTORY:  1.  Dr. Juleen China is treating her for hypertension.  2.  She has Dr. Virginia Rochester who is working her up for ulcer  disease.  3.  Dr. Sherene Sires is her pulmonologist.  4.  Dr. Sandria Manly is her neurologist.   FAMILY HISTORY:  Positive for hypertension, diabetes, and lymphoma.   SOCIAL HISTORY:  The patient is married, retired.  There is no intake of  alcohol or tobacco products.  Has two children.  Her caregiver after surgery  will be Dorinda Hill, her husband.   REVIEW OF SYSTEMS:  CNS:  No seizures, shoulder paralysis, numbness, double  vision.  The patient does have radiculitis with pain in the right lower  extremity.  CARDIOVASCULAR:  No chest pain, no angina, no orthopnea.  RESPIRATORY:  No productive cough, no hemoptysis, no shortness of breath.  GASTROINTESTINAL:  No nausea, vomiting, melena, or bloody stools.  GENITOURINARY:  No discharge, hematuria, or dysuria.  MUSCULOSKELETAL:  Primarily in present illness.   PHYSICAL EXAMINATION:  GENERAL:  Alert, cooperative, 77 year old white  female who is somewhat anxious.  VITAL SIGNS:  Blood pressure of 128/64, pulse 68 and regular, respirations  12.  HEENT:  Normocephalic.  PERRLA.  EOM's intact.  Oropharynx is clear.  CHEST:  Clear to auscultation, no rhonchi, no rales.  HEART:  Regular rate and rhythm, no murmurs are heard.  ABDOMEN:  Soft, nontender, liver and spleen not felt.  GENITOURINARY:  Not done, not pertinent to present illness.  RECTAL:  Not done, not pertinent to present illness.  BREASTS:  Not done, not pertinent to present illness.  PELVIC:  Not done, not pertinent to present illness.  EXTREMITIES:  The patient has no deformities, but does have weakness in the  right lower extremity, specifically on extensors.   ADMISSION DIAGNOSES:  1.  Herniated nucleus pulposus, L3-4 on the right with spinal stenosis.  2.  Hypertension.  3.  Ulcers.  4.  Multiple pulmonary nodules with biopsy by Dr. Sherene Sires.  Final results not      available at this dictation.   PLAN:  The patient will undergo decompressive lumbar laminectomy with  microdiskectomy at L3-4  on the right.  We will certainly ask the above  physicians to follow along with [place] during this patient's hospitalization  should we have any medical problems, pulmonary or otherwise. We note that  her chest x-ray of April 07, 2004, showed improvement in appearance of  chest x-ray with significant decreased in areas of parenchymal nodularity  seen previously.  No active disease seen.  This was read by Dr. Irish Lack.      DLU/MEDQ  D:  07/15/2004  T:  07/15/2004  Job:  [id number]   cc:   Charlaine Dalton. Sherene Sires, M.D. [facility]   Brooke Bonito, M.D.  [address]  [place]  [place] [address]  Fax: [phone]   Georgiana Spinner, M.D.  [address]  [place]  [place] [address]  Fax: [phone]

## 2011-01-13 NOTE — H&P (Signed)
NAME:  Kathleen Haynes, Kathleen Haynes                         ACCOUNT NO.:  [id number]   MEDICAL RECORD NO.:  [id number]                   PATIENT TYPE:  INP   LOCATION:  NA                                   FACILITY:  [facility]   PHYSICIAN:  Marlowe Kays, M.D.               DATE OF BIRTH:  1927/02/18   DATE OF ADMISSION:  12/03/2002  DATE OF DISCHARGE:                                HISTORY & PHYSICAL   CHIEF COMPLAINT:  Pain in my back and right leg.   HISTORY OF PRESENT ILLNESS:  This 77 year old, white female, has been seen  by Marlowe Kays, M.D. for continuing and progressive problems concerning  pain in her back with radiation into her lower extremity, more so on the  right.  This has been going on now for many months.  She has had  nonsteroidal anti-inflammatories, rest, and some home exercises but  unfortunately, she has not improved.  When seen in the early part of this  month, she was having more and more difficulty doing her day-to-day  activities.  She has trouble lifting her grandchildren and overall is very  discouraged about her day-to-day pain.  Previous CT scan and myelogram have  showed spinal stenosis of the lumbar spine, most specifically L2-L3, L3-L4,  and some at L5-S1.  After much discussion and consideration, it was felt the  patient would benefit with surgical intervention and being admitted for  decompression of these levels.   PAST MEDICAL HISTORY:  1. This lady has really been in good health throughout her lifetime.  2. Repair of the rotator cuff of the left shoulder in April 2001.  3. Removal of a spur of the left shoulder in December 2001.  4. She had ear surgery in 1975 and 1985.  5. Hypertension.  6. Frequent urinary tract infections.  7. Parkinson's.   FAMILY PHYSICIAN:  Brooke Bonito, M.D.   NEUROLOGIST:  Genene Churn. Love, M.D.   CURRENT MEDICATIONS:  1. Atenolol/Chlorthalidone 50/25 1 b.i.d.  2. Detrol LA 4 mg 1 daily.  3. Potassium chloride 20 mEq 1  daily.  4. Carbidopa/levo 50/200, 1 daily.  5. Requip 3 mg 1 t.i.d.   ALLERGIES:  She does not admit to any allergies.   FAMILY HISTORY:  Noncontributory.   SOCIAL HISTORY:  The patient is married and retired, neither smokes nor  drinks.  She has two children who will be partial care givers.   REVIEW OF SYSTEMS:  CNS:  No seizure, stroke, paralysis, normal  stabilization.  Her Parkinson's well-controlled with her medications.  No  tremors.  CARDIOVASCULAR:  No chest pain, no angina, no orthopnea.  RESPIRATORY:  No productive cough, no hemoptysis, no shortness of breath.  GASTROINTESTINAL:  No nausea, vomiting, melena, or bloody stool.  GENITOURINARY:  No discharge or hematuria or frequency.  History of urinary  tract infections are controlled very nicely with medication.   PHYSICAL  EXAMINATION:  GENERAL:  Alert, cooperative, friendly, somewhat  apprehensive, 77 year old, white female.  VITAL SIGNS:  Blood pressure 140/66, pulse 68, respirations 12.  HEENT:  Normocephalic.  PERRLA.  EOM intact.  Oropharynx clear.  CHEST:  Clear to auscultation.  No rhonchi or rales.  HEART:  Regular rate and rhythm.  No murmurs are heard.  ABDOMEN:  Soft, nontender.  Liver or spleen not felt.  GENITALIA/RECTAL/PELVIC/BREASTS:  Not done.  Not pertinent for present  illness.  EXTREMITIES:  The patient has no major weaknesses, negative straight leg on  the right and hypo but normal reflexive.   ADMISSION DIAGNOSES:  1. Spinal stenosis with lateral recess stenosis at L2 through L4 with some     at L5-S1.  2. Hypertension.  3. Parkinson's.   PLAN:  1. The patient will undergo decompression lumbar laminectomy from L2 to S1.  2. If she has any medical problems, we will certainly ask Brooke Bonito, M.D.     to follow along with [place] during this patient's hospitalization.     Dooley L. Cherlynn June.                 Marlowe Kays, M.D.    DLU/MEDQ  D:  11/26/2002  T:  11/26/2002  Job:  [id number]    cc:   Brooke Bonito, M.D.  [address]  [place]  [place] [address]  Fax: [phone]

## 2011-01-13 NOTE — Discharge Summary (Signed)
NAME:  Kathleen Haynes, Kathleen Haynes                         ACCOUNT NO.:  [id number]   MEDICAL RECORD NO.:  [id number]                   PATIENT TYPE:  INP   LOCATION:  0454                                 FACILITY:  [facility]   PHYSICIAN:  Marlowe Kays, M.D.               DATE OF BIRTH:  [birth year]   DATE OF ADMISSION:  12/03/2002  DATE OF DISCHARGE:  12/06/2002                                 DISCHARGE SUMMARY   ADMISSION DIAGNOSES:  1. Spinal stenosis with lateral recessed stenosis at L2 through L4 with some     at L5-S1.  2. Hypertension.  3. Parkinson's.   DISCHARGE DIAGNOSES:  1. Spinal stenosis with lateral recessed stenosis at L2 through L4 with some     at L5-S1, status post central decompression and laminectomy L2-3 through     S1.  2. Hypertension.  3. Parkinson's.  4. Postoperative anemia.   PROCEDURE:  The patient was taken to the operating room on 12/03/02 and  underwent a central decompression laminectomy L2-3 through S1.   SURGEON:  Marlowe Kays, M.D.   ASSISTANT:  Ronnell Guadalajara, M.D.   ANESTHESIA:  Surgery was done under general anesthesia.   DRAINS:  Penrose drain x1 was placed at the time of surgery.   CONSULTATIONS:  1. Physical therapy.  2. Child psychotherapist.  3. Case management.   BRIEF HISTORY:  The patient is a 77 year old white female who has been seen  by Dr. Simonne Come for continuing and progressive problems concerning pain in  her back with radiation to her lower extremity, more so on the right.  This  has been going on for many months.  She has had nonsteroidal anti-  inflammatories, rest, and some home exercises, and unfortunately she has not  improved.  When seen in the early part of the month, she was having more and  more difficulty doing her daily activities.  She has trouble lifting her  grandchildren.  Overall she is very discouraged by her day to day pain.  Previous CT myelogram showed spinal stenosis of the lumbar spine, most  specifically at  L2-3, L3-4, and some at L5-S1.  After much discussion and  consideration, it was felt the patient would benefit with surgical  intervention and decompression at these levels.  Risks and benefits of the  surgery were discussed with the patient.  The patient wished to proceed.   LABORATORY DATA:  CBC on admission showed a hemoglobin of 13.3, hematocrit  38.6, white blood cell count 5.7, red blood cell count 4.35, H&H were taken  throughout the hospital stay, hemoglobin and hematocrit did decline to 10.3  and 29.6, respectively on 12/04/02, however on 12/05/02 the hemoglobin was 10.6  and hematocrit 30.5, and were stable at the time of discharge.  Differential  on admission all within normal limits.  Coagulation studies on admission all  within normal limits.  Repeat chemistry on admission showed potassium  low at  2.9, repeat potassium on 12/03/02 revealed return to normal limits of 3.8.  Urinalysis on admission showed positive nitrite, small amount of leukocyte  esterase, many bacteria.  Followup urinalysis on 12/03/02 showed appearance  cloudy with positive nitrite, moderate leukocyte esterase and many bacteria.  EKG showed sinus bradycardia with ST-T wave abnormality, considered  anterolateral ischemia.  Preoperative chest x-ray revealed no acute disease.  Intraoperative x-ray of the spine revealed localization of L3 and L4.   HOSPITAL COURSE:  The patient was admitted to [facility] and taken  to the operating room.  She underwent the above stated procedure without  complications.  The patient tolerated the procedure well and allowed to  return to the recovery room on the orthopedic floor for continued  postoperative care.  On postoperative day #1 was seen by orthopedics, the  patient was resting comfortably, had slight complaints of pain in the back,  some flatus, hemoglobin and hematocrit 10.3 and 29.6, dressing was saturated  with blood and incision was intact, Penrose advanced about  1-1/2 inches.  The patient was to work with physical therapy 12/05/02, seen by orthopedics  the patient with positive flatus, had a bowel movement, slow with p.o.  intake, incision was clean, dry and intact, Foley was discontinued, Penrose  was discontinued and PCA was discontinued on this day.  The patient was left  in the hospital until she could tolerate a regular diet.  On 12/06/02 seen by  orthopedics, the patient was comfortable, was eating well and wanted to go  home.  Incision was clean, dry and intact.   DISPOSITION:  The patient was discharged to home on 12/06/02.   DISCHARGE MEDICATIONS:  1. Percocet 1-2 p.o. q.4-6h. p.r.n. pain.  2. Robaxin 1 p.o. q.8h. p.r.n. spasm.   DISCHARGE DIET:  As tolerated.   DISCHARGE ACTIVITIES:  The patient is to be up as tolerated, no bending,  twisting, lifting or squatting.   FOLLOW UP:  The patient is to follow up with Dr. Simonne Come in two weeks from  the day of surgery.  She is to call our office for an appointment.   CONDITION ON DISCHARGE:  Stable and improved.     Clarene Reamer, P.A.-C.                   Marlowe Kays, M.D.    SW/MEDQ  D:  12/30/2002  T:  12/30/2002  Job:  [id number]

## 2011-01-13 NOTE — Op Note (Signed)
NAME:  Kathleen Haynes, Kathleen Haynes                         ACCOUNT NO.:  [id number]   MEDICAL RECORD NO.:  [id number]                   PATIENT TYPE:  INP   LOCATION:  0004                                 FACILITY:  [facility]   PHYSICIAN:  Marlowe Kays, M.D.               DATE OF BIRTH:  May 18, 1927   DATE OF PROCEDURE:  12/03/2002  DATE OF DISCHARGE:                                 OPERATIVE REPORT   PREOPERATIVE DIAGNOSIS:  Spinal stenosis at L2-3, L3-4, L5-S1.   POSTOPERATIVE DIAGNOSIS:  Spinal stenosis at L2-3, L3-4, L5-S1.   OPERATION:  Central decompressive laminectomy L2-3, L3-4, L4-5, and L5-S1.   SURGEON:  Illene Labrador. Aplington, M.D.   ASSISTANT:  Philips J. Montez Morita, M.D.   ANESTHESIA:  General.   PATHOLOGY AND JUSTIFICATION FOR PROCEDURE:  She is having back and bilateral  leg pain, right greater than left.  She has a pars defect and  spondylolisthesis at L5 and S1 and on myelogram, some foraminal stenosis at  this level.  She also had moderately severe spinal stenosis at L3-4 with  slightly less but significant at L2-3.  L4-5 was relatively spared.  Plan  was to decompress this thoroughly as need be with sparing of L4-5, depending  on the anatomy and technical situation.   DESCRIPTION OF PROCEDURE:  Prophylactic antibiotics, satisfactory general  anesthesia, Foley catheter inserted, prone position on the Wilson frame.  Back was prepped with DuraPrep, draped into a sterile field, Ioban employed,  vertical incision in the midline and after demarcating two spinous processes  in the center of the field, we tagged these with Kocher clamps, took a  lateral x-ray, identifying these as L3 and L4.  Consequently, we extended  the incision somewhat distal and proximal to identify the L2 spinous process  and lamina and also down to the sacrum.  Soft tissue was dissected off  bilaterally, and two self-retaining McCullough retractors utilized.  It  turned out to be technically not feasible to be  able to preserve any of the  bone at L4-5.  Working from both ends in order to thoroughly decompress her,  we had to remove the entire neural arch of L5, including the pars defects  and, also, she had significant lateral stenosis into the L4-5 region from  above.  After decompressing this thoroughly grossly, we brought in the  microscope and completed the decompression with particular attention to the  L5 nerve roots distally.  At the conclusion of the case, all foramen were  patent to hockey-stick, and there was no visible compression of the spinal  canal.  There was no neurogenic injury to the dura.  The wound was irrigated  with sterile saline, and Gelfoam was placed over the dura and about the  nerve roots.  I placed a quarter-inch Penrose drain in the left posterior  distal wound and then closed the wound in layers with interrupted #1 Vicryl  in the paralumbar muscle and fascia, 2-0 Vicryl subcutaneous tissue, and  staples in the skin.  We took care not to sew in the drain.  Betadine and  Adaptic dry sterile dressing were applied.  She tolerated the procedure well  and was taken to the recovery room in satisfactory condition with no known  complications.  Estimated blood loss was 250 mL.  No blood replacement.                                               Marlowe Kays, M.D.   JA/MEDQ  D:  12/03/2002  T:  12/03/2002  Job:  [id number]

## 2011-01-26 ENCOUNTER — Emergency Department (HOSPITAL_COMMUNITY): Payer: Medicare Other

## 2011-01-26 ENCOUNTER — Emergency Department (HOSPITAL_COMMUNITY)
Admission: EM | Admit: 2011-01-26 | Discharge: 2011-01-26 | Disposition: A | Discharge status: Home / Self Care | Payer: Medicare Other | Attending: Emergency Medicine | Admitting: Emergency Medicine

## 2011-01-26 DIAGNOSIS — R51 Headache: Secondary | ICD-10-CM | POA: Insufficient documentation

## 2011-01-26 DIAGNOSIS — G20 Parkinson's disease: Secondary | ICD-10-CM | POA: Insufficient documentation

## 2011-01-26 DIAGNOSIS — W19XXXA Unspecified fall, initial encounter: Secondary | ICD-10-CM | POA: Insufficient documentation

## 2011-01-26 DIAGNOSIS — Y92009 Unspecified place in unspecified non-institutional (private) residence as the place of occurrence of the external cause: Secondary | ICD-10-CM | POA: Insufficient documentation

## 2011-01-26 DIAGNOSIS — S1093XA Contusion of unspecified part of neck, initial encounter: Secondary | ICD-10-CM | POA: Insufficient documentation

## 2011-01-26 DIAGNOSIS — Y998 Other external cause status: Secondary | ICD-10-CM | POA: Insufficient documentation

## 2011-01-26 DIAGNOSIS — S0003XA Contusion of scalp, initial encounter: Secondary | ICD-10-CM | POA: Insufficient documentation

## 2011-01-26 DIAGNOSIS — E7800 Pure hypercholesterolemia, unspecified: Secondary | ICD-10-CM | POA: Insufficient documentation

## 2011-01-26 DIAGNOSIS — M542 Cervicalgia: Secondary | ICD-10-CM | POA: Insufficient documentation

## 2011-01-26 DIAGNOSIS — G20A1 Parkinson's disease without dyskinesia, without mention of fluctuations: Secondary | ICD-10-CM | POA: Insufficient documentation

## 2011-02-09 ENCOUNTER — Other Ambulatory Visit: Payer: Self-pay | Admitting: Neurosurgery

## 2011-02-09 DIAGNOSIS — M542 Cervicalgia: Secondary | ICD-10-CM

## 2011-02-13 ENCOUNTER — Ambulatory Visit
Admission: RE | Admit: 2011-02-13 | Discharge: 2011-02-13 | Disposition: A | Discharge status: Home / Self Care | Payer: Medicare Other | Source: Ambulatory Visit | Attending: Neurosurgery | Admitting: Neurosurgery

## 2011-02-13 DIAGNOSIS — M542 Cervicalgia: Secondary | ICD-10-CM

## 2011-03-31 ENCOUNTER — Ambulatory Visit (HOSPITAL_COMMUNITY)
Admission: RE | Admit: 2011-03-31 | Discharge: 2011-03-31 | Disposition: A | Discharge status: Home / Self Care | Payer: Medicare Other | Source: Ambulatory Visit | Attending: Neurosurgery | Admitting: Neurosurgery

## 2011-03-31 ENCOUNTER — Other Ambulatory Visit (HOSPITAL_COMMUNITY): Payer: Self-pay | Admitting: Neurosurgery

## 2011-03-31 ENCOUNTER — Encounter (HOSPITAL_COMMUNITY)
Admission: RE | Admit: 2011-03-31 | Discharge: 2011-03-31 | Disposition: A | Discharge status: Home / Self Care | Payer: Medicare Other | Source: Ambulatory Visit | Attending: Neurosurgery | Admitting: Neurosurgery

## 2011-03-31 DIAGNOSIS — S129XXA Fracture of neck, unspecified, initial encounter: Secondary | ICD-10-CM

## 2011-03-31 DIAGNOSIS — Z01812 Encounter for preprocedural laboratory examination: Secondary | ICD-10-CM | POA: Insufficient documentation

## 2011-03-31 DIAGNOSIS — Z01818 Encounter for other preprocedural examination: Secondary | ICD-10-CM | POA: Insufficient documentation

## 2011-03-31 DIAGNOSIS — M542 Cervicalgia: Secondary | ICD-10-CM | POA: Insufficient documentation

## 2011-03-31 DIAGNOSIS — I10 Essential (primary) hypertension: Secondary | ICD-10-CM | POA: Insufficient documentation

## 2011-03-31 LAB — CBC
MCH: 30.1 pg (ref 26.0–34.0)
MCV: 86.4 fL (ref 78.0–100.0)
Platelets: 294 10*3/uL (ref 150–400)
RDW: 13.5 % (ref 11.5–15.5)
WBC: 5.7 10*3/uL (ref 4.0–10.5)

## 2011-03-31 LAB — SURGICAL PCR SCREEN: MRSA, PCR: NEGATIVE

## 2011-03-31 LAB — BASIC METABOLIC PANEL
Calcium: 10.5 mg/dL (ref 8.4–10.5)
Chloride: 98 mEq/L (ref 96–112)
Creatinine, Ser: 1.19 mg/dL — ABNORMAL HIGH (ref 0.50–1.10)
GFR calc Af Amer: 53 mL/min — ABNORMAL LOW (ref >60)

## 2011-04-04 ENCOUNTER — Inpatient Hospital Stay (HOSPITAL_COMMUNITY): Payer: Medicare Other

## 2011-04-04 ENCOUNTER — Inpatient Hospital Stay (HOSPITAL_COMMUNITY)
Admission: RE | Admit: 2011-04-04 | Discharge: 2011-04-06 | DRG: 473 | Disposition: A | Discharge status: Home / Self Care | Payer: Medicare Other | Source: Ambulatory Visit | Attending: Neurosurgery | Admitting: Neurosurgery

## 2011-04-04 DIAGNOSIS — S12100A Unspecified displaced fracture of second cervical vertebra, initial encounter for closed fracture: Principal | ICD-10-CM | POA: Diagnosis present

## 2011-04-04 DIAGNOSIS — W19XXXA Unspecified fall, initial encounter: Secondary | ICD-10-CM | POA: Diagnosis present

## 2011-04-04 DIAGNOSIS — G20A1 Parkinson's disease without dyskinesia, without mention of fluctuations: Secondary | ICD-10-CM | POA: Diagnosis present

## 2011-04-04 DIAGNOSIS — G20 Parkinson's disease: Secondary | ICD-10-CM | POA: Diagnosis present

## 2011-04-04 DIAGNOSIS — I10 Essential (primary) hypertension: Secondary | ICD-10-CM | POA: Diagnosis present

## 2011-04-04 DIAGNOSIS — Y998 Other external cause status: Secondary | ICD-10-CM

## 2011-04-05 NOTE — Op Note (Signed)
Kathleen Haynes, Kathleen Haynes NO.:  [id number]  MEDICAL RECORD NO.:  [id number]  LOCATION:  3028                         FACILITY:  [facility]  PHYSICIAN:  Danae Orleans. Venetia Maxon, M.D.  DATE OF BIRTH:  1927-01-22  DATE OF PROCEDURE:  04/04/2011 DATE OF DISCHARGE:                              OPERATIVE REPORT   PREOPERATIVE DIAGNOSIS:  Chronic C2 fracture and nonunion.  POSTOPERATIVE DIAGNOSIS:  Chronic C2 fracture and nonunion.  PROCEDURE:  C1-2 posterior cervical fusion with autograft, allograft, BMP, and Songer cable.  SURGEON:  Danae Orleans. Venetia Maxon, MD  ASSISTANT:  Georgiann Cocker, RN and Hilda Lias, MD  ANESTHESIA:  General endotracheal anesthesia.  ESTIMATED BLOOD LOSS:  Minimal.  COMPLICATIONS:  None.  DISPOSITION:  To recovery.  INDICATIONS:  Kathleen Haynes is an 77 year old woman who fell and broke her neck.  She has a C2 fracture and was kept in a collar for many months without any evidence of healing.  She has chronic neck pain.  It was elected to take her to surgery for posterior cervical fusion at C1-2 level.  PROCEDURE:  Kathleen Haynes was brought to the operating room.  Following satisfactory and uncomplicated induction of general endotracheal anesthesia plus intravenous lines, the patient was placed in a cervical collar and 3 pin head fixation, turned prone position and maintained in neutral alignment.  Her posterior neck was then shaved, prepped, and draped in usual sterile fashion.  Prior to performing the surgery, lateral C-arm radiograph was obtained which showed well-aligned C1 and C2 levels.  Area of planned incision was infiltrated with local lidocaine.  Incision was made in the midline, carried through the avascular midline plane to the C2 spinous process and the C1 ring was exposed.  The bone was cleared of investing soft tissue.  The dura was exposed around the level of C1 and nerve hook could easily be passed under C1 ring.  A 0 Prolene stitch was  then passed and subsequently a single armed Songer cable was brought under the ring of C1.  A patellar allograft wedge was fashioned for C1-2 graft material and this was then placed within overlying the C1 and C2 levels.  The Songer cable was then brought around the top of the graft and hooked over the spinous process of C2.  The Songer cable was then tightened to 30-inch pounds with a snug fit and the bone graft appeared to be well positioned and strongly fixated.  The extra small BMP was used, packed along the bony edges, and the Songer cable was crimped and then cut.  The remaining Vitoss allograft material was placed as well.  The wound was then closed.  A final x-ray demonstrated well positioned bone graft and Songer cable, the posterior cervical musculature was reapproximated with 0 Vicryl sutures and skin edges were approximated with 2-0 Vicryl interrupted inverted sutures and skin edges were approximated with staples.  Wound was dressed with sterile occlusive dressing.  The patient was then returned to supine position on the OR stretcher and was extubated.  Head fixation was removed and cervical collar applied.  The patient was taken to recovery in stable satisfactory condition.  She tolerated the operation well.  Danae Orleans. Venetia Maxon, M.D.     JDS/MEDQ  D:  04/04/2011  T:  04/04/2011  Job:  [id number]  Electronically Signed by Maeola Harman M.D. on 04/05/2011 01:29:21 PM

## 2011-04-24 ENCOUNTER — Encounter (HOSPITAL_COMMUNITY)
Admission: RE | Admit: 2011-04-24 | Discharge: 2011-04-24 | Disposition: A | Discharge status: Home / Self Care | Payer: Medicare Other | Source: Ambulatory Visit | Attending: Neurosurgery | Admitting: Neurosurgery

## 2011-04-24 LAB — BASIC METABOLIC PANEL
Calcium: 10.2 mg/dL (ref 8.4–10.5)
GFR calc non Af Amer: >60 mL/min (ref >60)
Sodium: 137 mEq/L (ref 135–145)

## 2011-04-24 LAB — SURGICAL PCR SCREEN
MRSA, PCR: NEGATIVE
Staphylococcus aureus: POSITIVE — AB

## 2011-04-24 LAB — CBC
MCH: 29.9 pg (ref 26.0–34.0)
MCHC: 34.3 g/dL (ref 30.0–36.0)
Platelets: 354 10*3/uL (ref 150–400)

## 2011-05-04 ENCOUNTER — Ambulatory Visit (HOSPITAL_COMMUNITY): Payer: Medicare Other

## 2011-05-04 ENCOUNTER — Inpatient Hospital Stay (HOSPITAL_COMMUNITY)
Admission: RE | Admit: 2011-05-04 | Discharge: 2011-05-05 | DRG: 473 | Disposition: A | Discharge status: Home / Self Care | Payer: Medicare Other | Source: Ambulatory Visit | Attending: Neurosurgery | Admitting: Neurosurgery

## 2011-05-04 DIAGNOSIS — Y849 Medical procedure, unspecified as the cause of abnormal reaction of the patient, or of later complication, without mention of misadventure at the time of the procedure: Secondary | ICD-10-CM | POA: Diagnosis not present

## 2011-05-04 DIAGNOSIS — Z01812 Encounter for preprocedural laboratory examination: Secondary | ICD-10-CM

## 2011-05-04 DIAGNOSIS — Z79899 Other long term (current) drug therapy: Secondary | ICD-10-CM

## 2011-05-04 DIAGNOSIS — G4733 Obstructive sleep apnea (adult) (pediatric): Secondary | ICD-10-CM | POA: Diagnosis present

## 2011-05-04 DIAGNOSIS — G20 Parkinson's disease: Secondary | ICD-10-CM | POA: Diagnosis present

## 2011-05-04 DIAGNOSIS — I10 Essential (primary) hypertension: Secondary | ICD-10-CM | POA: Diagnosis present

## 2011-05-04 DIAGNOSIS — IMO0002 Reserved for concepts with insufficient information to code with codable children: Principal | ICD-10-CM | POA: Diagnosis present

## 2011-05-04 DIAGNOSIS — G20A1 Parkinson's disease without dyskinesia, without mention of fluctuations: Secondary | ICD-10-CM | POA: Diagnosis present

## 2011-05-04 DIAGNOSIS — S0100XA Unspecified open wound of scalp, initial encounter: Secondary | ICD-10-CM | POA: Diagnosis not present

## 2011-05-04 DIAGNOSIS — G43909 Migraine, unspecified, not intractable, without status migrainosus: Secondary | ICD-10-CM | POA: Diagnosis present

## 2011-05-04 DIAGNOSIS — Z882 Allergy status to sulfonamides status: Secondary | ICD-10-CM

## 2011-05-04 DIAGNOSIS — Y9210 Unspecified residential institution as the place of occurrence of the external cause: Secondary | ICD-10-CM | POA: Diagnosis not present

## 2011-05-11 NOTE — Op Note (Signed)
Kathleen Haynes, NOBILE NO.:  [id number]  MEDICAL RECORD NO.:  [id number]  LOCATION:  3035                         FACILITY:  [facility]  PHYSICIAN:  Danae Orleans. Venetia Maxon, M.D.  DATE OF BIRTH:  1927/06/29  DATE OF PROCEDURE:  05/04/2011 DATE OF DISCHARGE:                              OPERATIVE REPORT   PREOPERATIVE DIAGNOSIS:  C1-2 fracture.  POSTOPERATIVE DIAGNOSIS:  C1-2 fracture.  PROCEDURE:  Revision of C1-2 posterior fusion.  SURGEON:  Danae Orleans. Venetia Maxon, MD  ASSISTANT:  Coletta Memos, MD and Georgiann Cocker, RN  ANESTHESIA:  General endotracheal anesthesia.  ESTIMATED BLOOD LOSS:  Minimal.  COMPLICATIONS:  Mechanical failure of the Mayfield head holder.  DISPOSITION:  Recovery room.  INDICATIONS:  Kathleen Haynes is an 77 year old woman with chronic nonunion of the C2 fracture.  She was taken to surgery for posterior cervical fusion at C1-2, but was noncompliant with collar wearing and fell multiple times at home and on first postoperative visit, the posterior fusion construct head of bone was no longer in position.  It was therefore elected to take her to surgery for revision of this posterior fusion.  PROCEDURE:  Ms. Grunden was brought to the operating room.  She was placed in three pinhead fixation.  After positioning the patient and confirming her the alignment of C1-2 level on fluoroscopic x-ray, her posterior neck was prepped and draped in usual sterile fashion over shaving a posterior neck.  Area of planned incision was infiltrated with local lidocaine and her previous incision was reopened.  During exposure of the posterior bone graft, the patient's head was felt to move.  I called Dr. Franky Macho, who assessed the patient, discovered that the Mayfield head holder had not held at the side of the clamp.  We elected to close the patient's neck and then dressed her neck and then repositioned her on a supine position on the OR gurney where we reapplied the  cranial tongs and after placing staples in a scalp laceration.  The patient was then returned to prone position, re-anchored with on Mayfield head holder and we used a head holder and this appeared to hold without further difficulty.  Her neck was then prepped and then reopened and the previous fusion bone graft was removed.  A Songer cable was then placed and bone graft was refashioned to fit within the C1-2 interval on anchoring the Songer cable.  We had a fracture of the one of the posterior horns of the C2 spinous process and this was at fairly low tension on the cable.  We then reinserted a new Songer cable and placed a new bone graft and in trying to tighten this one again at fairly low pressure, the posterior ring of C1 cracked.  At this point, I felt that the patient's bone quality was so poor that the likelihood of having a solid arthrodesis and with further surgery such as an occipitocervical fusion was likelihood have good outcome without would be low and hardware failure would be high and at this point, I elected to not to placed any additional hardware.  We then had been decorticated the bony surfaces, then placed the extra small BMP and Vitoss  foam pack with some of the remaining bone allograft in the C1-2 interval.  We then closed the fascia and subcutaneous tissues and then placed staple in the skin edges and placed a sterile occlusive dressing.  The patient was then returned to a supine position on the OR gurney and the three pinhead fixation was removed.  The patient was extubated and taken to recovery in stable satisfactory condition having tolerated the operation well.     Danae Orleans. Venetia Maxon, M.D.     JDS/MEDQ  D:  05/04/2011  T:  05/04/2011  Job:  [id number]  Electronically Signed by Maeola Harman M.D. on 05/11/2011 02:46:57 PM

## 2011-05-19 LAB — BASIC METABOLIC PANEL
BUN: 26 — ABNORMAL HIGH
CO2: 25
Chloride: 103
Creatinine, Ser: 1.06

## 2011-05-19 LAB — ABO/RH: ABO/RH(D): A POS

## 2011-05-19 LAB — CBC
HCT: 35.5 — ABNORMAL LOW
MCHC: 34.3
MCV: 91.4
Platelets: 229
RDW: 14.4
WBC: 4.8

## 2011-05-22 LAB — COMPREHENSIVE METABOLIC PANEL
ALT: <8
Albumin: 2.5 — ABNORMAL LOW
Calcium: 8.9
Glucose, Bld: 83
Sodium: 138
Total Protein: 5.6 — ABNORMAL LOW

## 2011-05-22 LAB — DIFFERENTIAL
Eosinophils Absolute: 0.2
Lymphs Abs: 1.5
Monocytes Absolute: 0.8
Monocytes Relative: 15 — ABNORMAL HIGH
Neutro Abs: 2.8
Neutrophils Relative %: 53

## 2011-05-22 LAB — CBC
HCT: 25.6 — ABNORMAL LOW
Hemoglobin: 8.3 — ABNORMAL LOW
Hemoglobin: 9 — ABNORMAL LOW
MCHC: 34.4
MCHC: 34.5
MCHC: 35.3
Platelets: 239
Platelets: 402 — ABNORMAL HIGH
RDW: 13.6
RDW: 13.7
RDW: 14.1

## 2011-05-22 LAB — BASIC METABOLIC PANEL
CO2: 30
Creatinine, Ser: 0.78
GFR calc Af Amer: >60
Glucose, Bld: 98
Glucose, Bld: 90
Potassium: 3.1 — ABNORMAL LOW
Sodium: 137
Sodium: 138

## 2011-09-15 DIAGNOSIS — E789 Disorder of lipoprotein metabolism, unspecified: Secondary | ICD-10-CM | POA: Diagnosis not present

## 2011-09-15 DIAGNOSIS — Z79899 Other long term (current) drug therapy: Secondary | ICD-10-CM | POA: Diagnosis not present

## 2011-09-22 DIAGNOSIS — E789 Disorder of lipoprotein metabolism, unspecified: Secondary | ICD-10-CM | POA: Diagnosis not present

## 2011-09-22 DIAGNOSIS — R350 Frequency of micturition: Secondary | ICD-10-CM | POA: Diagnosis not present

## 2011-09-22 DIAGNOSIS — I10 Essential (primary) hypertension: Secondary | ICD-10-CM | POA: Diagnosis not present

## 2011-11-24 DIAGNOSIS — G20 Parkinson's disease: Secondary | ICD-10-CM | POA: Diagnosis not present

## 2011-12-13 DIAGNOSIS — S12100A Unspecified displaced fracture of second cervical vertebra, initial encounter for closed fracture: Secondary | ICD-10-CM | POA: Diagnosis not present

## 2011-12-21 DIAGNOSIS — G20 Parkinson's disease: Secondary | ICD-10-CM | POA: Diagnosis not present

## 2011-12-21 DIAGNOSIS — R634 Abnormal weight loss: Secondary | ICD-10-CM | POA: Diagnosis not present

## 2011-12-21 DIAGNOSIS — E789 Disorder of lipoprotein metabolism, unspecified: Secondary | ICD-10-CM | POA: Diagnosis not present

## 2011-12-21 DIAGNOSIS — I10 Essential (primary) hypertension: Secondary | ICD-10-CM | POA: Diagnosis not present

## 2012-03-19 DIAGNOSIS — I10 Essential (primary) hypertension: Secondary | ICD-10-CM | POA: Diagnosis not present

## 2012-03-19 DIAGNOSIS — E789 Disorder of lipoprotein metabolism, unspecified: Secondary | ICD-10-CM | POA: Diagnosis not present

## 2012-03-22 DIAGNOSIS — G20 Parkinson's disease: Secondary | ICD-10-CM | POA: Diagnosis not present

## 2012-03-26 DIAGNOSIS — E789 Disorder of lipoprotein metabolism, unspecified: Secondary | ICD-10-CM | POA: Diagnosis not present

## 2012-03-26 DIAGNOSIS — G20 Parkinson's disease: Secondary | ICD-10-CM | POA: Diagnosis not present

## 2012-03-26 DIAGNOSIS — K5900 Constipation, unspecified: Secondary | ICD-10-CM | POA: Diagnosis not present

## 2012-03-26 DIAGNOSIS — I10 Essential (primary) hypertension: Secondary | ICD-10-CM | POA: Diagnosis not present

## 2012-06-10 DIAGNOSIS — S12100A Unspecified displaced fracture of second cervical vertebra, initial encounter for closed fracture: Secondary | ICD-10-CM | POA: Diagnosis not present

## 2012-06-25 DIAGNOSIS — Z23 Encounter for immunization: Secondary | ICD-10-CM | POA: Diagnosis not present

## 2012-09-20 DIAGNOSIS — F068 Other specified mental disorders due to known physiological condition: Secondary | ICD-10-CM | POA: Diagnosis not present

## 2012-09-20 DIAGNOSIS — G609 Hereditary and idiopathic neuropathy, unspecified: Secondary | ICD-10-CM | POA: Diagnosis not present

## 2012-09-20 DIAGNOSIS — G20 Parkinson's disease: Secondary | ICD-10-CM | POA: Diagnosis not present

## 2012-09-26 DIAGNOSIS — E789 Disorder of lipoprotein metabolism, unspecified: Secondary | ICD-10-CM | POA: Diagnosis not present

## 2012-09-26 DIAGNOSIS — G20 Parkinson's disease: Secondary | ICD-10-CM | POA: Diagnosis not present

## 2012-09-26 DIAGNOSIS — M542 Cervicalgia: Secondary | ICD-10-CM | POA: Diagnosis not present

## 2012-09-26 DIAGNOSIS — G4730 Sleep apnea, unspecified: Secondary | ICD-10-CM | POA: Diagnosis not present

## 2012-10-19 ENCOUNTER — Encounter: Payer: Self-pay | Admitting: Neurology

## 2012-11-22 ENCOUNTER — Other Ambulatory Visit: Payer: Self-pay | Admitting: Neurology

## 2012-11-25 ENCOUNTER — Other Ambulatory Visit: Payer: Self-pay

## 2012-11-25 MED ORDER — CARBIDOPA-LEVODOPA 25-100 MG PO TABS: ORAL_TABLET | ORAL | Status: DC

## 2012-11-27 ENCOUNTER — Ambulatory Visit (INDEPENDENT_AMBULATORY_CARE_PROVIDER_SITE_OTHER): Payer: Medicare Other | Admitting: Neurology

## 2012-11-27 ENCOUNTER — Encounter: Payer: Self-pay | Admitting: Neurology

## 2012-11-27 VITALS — BP 112/70 | HR 68 | Temp 96.1°F | Ht 61.0 in | Wt 101.0 lb

## 2012-11-27 DIAGNOSIS — G20 Parkinson's disease: Secondary | ICD-10-CM | POA: Insufficient documentation

## 2012-11-27 DIAGNOSIS — G20A1 Parkinson's disease without dyskinesia, without mention of fluctuations: Secondary | ICD-10-CM

## 2012-11-27 DIAGNOSIS — G4730 Sleep apnea, unspecified: Secondary | ICD-10-CM

## 2012-11-27 DIAGNOSIS — G4752 REM sleep behavior disorder: Secondary | ICD-10-CM

## 2012-11-27 DIAGNOSIS — G4710 Hypersomnia, unspecified: Secondary | ICD-10-CM | POA: Insufficient documentation

## 2012-11-27 DIAGNOSIS — F0280 Dementia in other diseases classified elsewhere without behavioral disturbance: Secondary | ICD-10-CM | POA: Insufficient documentation

## 2012-11-27 MED ORDER — MEMANTINE HCL 10 MG PO TABS: 10.0000 mg | ORAL_TABLET | Freq: Two times a day (BID) | ORAL | Status: DC

## 2012-11-27 MED ORDER — MEMANTINE HCL 28 X 5 MG & 21 X 10 MG PO TABS: ORAL_TABLET | ORAL | Status: DC

## 2012-11-27 MED ORDER — CARBIDOPA-LEVODOPA 25-100 MG PO TABS: ORAL_TABLET | ORAL | Status: DC

## 2012-11-27 MED ORDER — CLONAZEPAM 0.25 MG PO TBDP: 0.2500 mg | ORAL_TABLET | Freq: Every evening | ORAL | Status: DC | PRN

## 2012-11-27 NOTE — Patient Instructions (Signed)
Be addressed today for related problems #1 Parkinson's disease with hypokinesia of response to carbidopa and levodopa #2 memory loss related to Parkinson's disease and probably accentuated by severe hearing loss. 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 at 7 cm at the residual apneas are still obstructive in nature and could benefit from an increase in pressure.  klonipin was added and namenda was added.  To  make CPAP more on comfortable for her we will change to a peel air marks DME is  [facility] and we will asked them to change the mask to a nasal pillow model.

## 2012-11-27 NOTE — Assessment & Plan Note (Signed)
Be addressed today for related problems #1 Parkinson's disease with hypokinesia of response to carbidopa and levodopa #2 memory loss related to Parkinson's disease and probably accentuated by severe hearing loss. 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 at 7 cm at the residual apneas are still obstructive in nature and could benefit from an increase in pressure.  klonipin was added and namenda was added.  To  make CPAP more on comfortable for her we will change to a peel air marks D[facility] is  [facility] and we will asked them to change the mask to a nasal pillow model.   

## 2012-11-27 NOTE — Progress Notes (Signed)
Kathleen Haynes  is a Caucasian right-handed female patient formerly followed by Dr. Avie Echevaria.  The patient is a patient is an 79 year old right-handed married female with left dominant Parkinson's disease diagnosed in 1999 at which time she begun to use Sinemet she is now on 25 of 100 mg comfortably.  Intake  per 1 tab  at 8 AM, and  1 at 12 noon,  one at 4 PM and one at bedtime around 9 PM po.  she has cramps in her legs at night and wearing off phenomena as well as dyskinesias- hyperkinesias during the day. A medicine effect lasts about 3-3-1/2 hours she is able to dress herself feed herself base herself but is using today a wheelchair after a fall in which she suffered a neck injury she fractured her C2 vertebral body.  A CT of the head showed generalized atrophy of the brain she is followed by Dr. Linzie Collin, she had no neurologic deficits after the fall . She has a history of bladder incontinence  And short-term memory loss. Her  Mini-Mental status and generally 24-30 points, for animal fluency 9 points ,  Independent daily living 5 points, instrumental activities of daily living scale 4 points.   She still does not sleep well at night she takes a lot of naps during the day on 11/03/2010 she underwent a PSG which showed complex mainly central sleep apnea . But was not able to demonstrate REM onset behavior disorderA repeat study in April did not reveal an obvious optimal pressure on CPAP or BiPAP- CPAP at 7 cm however it appeared to be most effective,  she still does not tolerate the settings very well . Her husband tries to encourage her to use it, but she cannot even place the mask in her face -  the couple is here today to followup on the CPAP and alternative treatments her husband has tried to get her to use CPAP during the day when she takes a nap and she is just do well if she is willing to put it on in the first place. She is sleep  talking , yelling and thrashing in her sleep and speaks to  her deceased parents. She has many dyskinesias here visible .   Dr. Imagene Gurney last visit on 09/20/2012 , he ordered a lab test: TSH 2.45 units, RPR negative, vitamin B12 716 pg/mL all normal.   I am further able to review the compliance today: the patient used CPAP 19/30 days on the 8 days over 4 hours nightly which was at times only 2 hours and 13 minutes- not CMS compliance  her apnea-hypopnea index was still very high but when using CPAP at 23.6. Please note , that  her baseline AHI was over 40 -however she does have only a few central apneas according to the download from this march mostly obstructive apneas this would allow [place] to increase the pressure by 1 or 2 cm water to eliminate obstructive apnea components. REM behaviour and dementia make the use of the CPAP more difficult.    Current outpatient prescriptions:atenolol-chlorthalidone (TENORETIC) 50-25 MG per tablet, Take 1 tablet by mouth daily., Disp: , Rfl: ;  carbidopa-levodopa (SINEMET IR) 25-100 MG per tablet, Take one tab at 8am, one half at noon, one tab at 4pm and one half at bedtime, Disp: 270 tablet, Rfl: 3;  meloxicam (MOBIC) 7.5 MG tablet, Take 7.5 mg by mouth daily. As needed, Disp: , Rfl:  pilocarpine (SALAGEN) 7.5 MG tablet, Take 7.5  mg by mouth 1 day or 1 dose., Disp: , Rfl: ;  potassium chloride SA (K-DUR,KLOR-CON) 20 MEQ tablet, Take 20 mEq by mouth daily., Disp: , Rfl: ;  pravastatin (PRAVACHOL) 80 MG tablet, Take 80 mg by mouth daily., Disp: , Rfl: ;  tolterodine (DETROL LA) 4 MG 24 hr capsule, Take 4 mg by mouth daily., Disp: , Rfl:   Mental Status: Alert, oriented,- is very hard of hearing and is not using hearing aids at this point. II-Discs flat bilaterally. Visual fields intact. No nystagmus orofacial dyskinesia is hyperkinesias was protruding tongue dystonia of the neck trunk and extremities continuous movement is noted. Extraocular movements intact.  Pupils reactive bilaterally. Smile symmetric midline tongue  extension Motor: Or increased muscle tone and almost no visible resting tremor there is some loss of muscle mass and the patient does have crepitation or within the wrist and elbow as well as at the ankle she appears hypokinetic continuously with the portion distal to the naval.  Sensory: Loss of vibration and light touch sense throughout the left body  Deep Tendon Reflexes: 2+ and symmetric throughout Plantars: Downgoing bilaterally Cerebellar: Normal gait and station were his abnormal posture of dyskinesia or dystonia dysmetria rapid alternating movements are not performed fragmented gait was fragmented current fall risk is increased forward and backward father then left to right for risk assessment over all is 12 points at Epworth sleepiness scale today is 19 points. Her  memory is progressively declining over the last 3 years followed by Mini-Mental Status Examination. The current results would justify the diagnosis of dementia.   Assessment and plan: 1) hypersomnia with sleep apnea of complex origin. REM behavior disorder of his Klonopin and increase CPAP pressure by 1 cm to 8 cm water with the use of nasal air device. Patient may benefit from a nasal spray to keep the nasal passage patent.  #2 dementia with Parkinson's disease or he should jerk on consider starting Namenda . #3 gait disorder related to Parkinson's disease and hypokinesia patient and should have followup with his physical therapy and exercise regimen for home use.  No laboratory results are needed today. Download from the CPAP machine was obtained in the office and compared  to the sleep study results and previous downloads by [facility].        She is followed by [facility] :the machine is set at 7 cm water.  Will increase pressure to 8 cm water with the use of her choice mask  ( Try pillair .  and prescribe KLONIPIN.

## 2012-12-11 DIAGNOSIS — M542 Cervicalgia: Secondary | ICD-10-CM | POA: Diagnosis not present

## 2013-02-13 DIAGNOSIS — E789 Disorder of lipoprotein metabolism, unspecified: Secondary | ICD-10-CM | POA: Diagnosis not present

## 2013-02-13 DIAGNOSIS — Z79899 Other long term (current) drug therapy: Secondary | ICD-10-CM | POA: Diagnosis not present

## 2013-02-13 DIAGNOSIS — I10 Essential (primary) hypertension: Secondary | ICD-10-CM | POA: Diagnosis not present

## 2013-02-20 DIAGNOSIS — I10 Essential (primary) hypertension: Secondary | ICD-10-CM | POA: Diagnosis not present

## 2013-02-20 DIAGNOSIS — E789 Disorder of lipoprotein metabolism, unspecified: Secondary | ICD-10-CM | POA: Diagnosis not present

## 2013-03-24 ENCOUNTER — Telehealth: Payer: Self-pay | Admitting: Neurology

## 2013-03-27 ENCOUNTER — Encounter: Payer: Self-pay | Admitting: Neurology

## 2013-04-01 ENCOUNTER — Telehealth: Payer: Self-pay | Admitting: Neurology

## 2013-04-01 NOTE — Telephone Encounter (Signed)
Pt is being reassigned to another Dr. Prior Dr. Sandria Manly pt

## 2013-04-01 NOTE — Telephone Encounter (Signed)
Prior Dr. Sandria Manly pt has not been seen by Dr. Frances Furbish needs to be reassign per Dr. Frances Furbish

## 2013-04-02 NOTE — Telephone Encounter (Signed)
Assigned to Dr. Hosie Poisson for Parkinson's.

## 2013-04-18 ENCOUNTER — Ambulatory Visit (INDEPENDENT_AMBULATORY_CARE_PROVIDER_SITE_OTHER): Payer: Medicare Other | Admitting: Neurology

## 2013-04-18 ENCOUNTER — Encounter: Payer: Self-pay | Admitting: Neurology

## 2013-04-18 VITALS — BP 110/56 | HR 66 | Temp 97.5°F | Ht 64.0 in | Wt 107.0 lb

## 2013-04-18 DIAGNOSIS — G4752 REM sleep behavior disorder: Secondary | ICD-10-CM

## 2013-04-18 DIAGNOSIS — G4730 Sleep apnea, unspecified: Secondary | ICD-10-CM | POA: Diagnosis not present

## 2013-04-18 DIAGNOSIS — G20 Parkinson's disease: Secondary | ICD-10-CM

## 2013-04-18 DIAGNOSIS — F0280 Dementia in other diseases classified elsewhere without behavioral disturbance: Secondary | ICD-10-CM

## 2013-04-18 DIAGNOSIS — G4710 Hypersomnia, unspecified: Secondary | ICD-10-CM | POA: Diagnosis not present

## 2013-04-18 NOTE — Patient Instructions (Addendum)
I think overall you are doing fairly well but I do want to suggest a few things today:  As far as your medications are concerned, I would like to suggest no changes.   As far as diagnostic testing: no new test today.  I would like to see you back in 6 months, sooner if we need to. Please call us with any interim questions, concerns, problems, updates or refill requests.  Brett Canales is my clinical assistant and will answer any of your questions and relay your messages to me and also relay most of my messages to you.  Our phone number is [phone]. We also have an after hours call service for urgent matters and there is a physician on-call for urgent questions. For any emergencies you know to call 911 or go to the nearest emergency room.

## 2013-04-18 NOTE — Progress Notes (Signed)
Subjective:    Patient ID: YANI COVENTRY is a 79 y.o. female.  HPI  Interim history:   Ms. Decker is a very pleasant 79 year old right-handed woman who presents for followup consultation of her Parkinson's disease. She is accompanied by her husband today. This is her first visit with me after Dr. Imagene Gurney retirement and she last saw Dr. Avie Echevaria on 09/20/2012, and which time he started donepezil for dementia and did some blood work. He decreased her Sinemet to one tablet, alternating with half a tablet 4 times a day for a total of 300 mg of levodopa to help with dyskinesias and hallucinations. He did not start her on amantadine because of her dementia. She was also advised to start CPAP for obstructive sleep apnea. She is currently on donepezil 5 mg daily, tramadol, meloxicam, multivitamin, stool softener, Detrol LA, pilocarpine, pravastatin, atenolol-chlorthalidone, Klor-Con, Sinemet 1 tablet 8, half a tablet at noon, 1 tablet at 4 PM and half a tablet at bedtime. She had B12, TSH, RPR tested in January and I reviewed those test results: they were normal.  I reviewed Dr. Imagene Gurney prior notes and the patient's records and below is a summary of that review:  79 year old right-handed woman who was diagnosed with left-sided Parkinson's disease in 1999 and started on Sinemet at the time. She started having dyskinesias as started using a rolling walker with a seat and a wheelchair. She has had recurrent falls and freezing spells and festination and did not improve with selegiline which was stopped. She developed constipation and urinary incontinence and does not exercise regularly. She had hallucinations and delusions. She fell and hit her head in May 2012 and was seen in the emergency room. She was found to have a C2 vertebral fracture and CT head without contrast showed generalized atrophy. She had surgery by Dr. Venetia Maxon in July 2012 and August 2012 she started having memory loss in 2012. In December 2012 her  MMSE was 24, clock drawing was 4, and normal fluency was 9. She does not sleep well at night and takes multiple naps during the day. She was diagnosed with complex sleep apnea in March 2012 without REM onset behavior disorder. She had a repeat sleep study in April 2012 for CPAP titration but then never started CPAP. She had frequent PLMS. She takes meloxicam and tramadol for her residual head and neck pain.  She saw Dr. Vickey Huger in April of this year and was encouraged to use her CPAP. Dr. Vickey Huger reviewed her sleep test results and compliance data with the patient and her husband at the time.  She wears a soft neck brace. She has not fallen recently. She has a good support system with daughter living next door. She has hallucinations especially at night. She has not been using her CPAP and it was taken back. She continually pulled off the mask in the middle of the night and the husband would wake up with a leaking air noise. He has tried but has not been able to make her use the CPAP. She used it 19/30 days and mostly for 1 to 2 hours at a time and she still had a high residual AHI. She has been on Namenda and Aricept. She has dyskinesias on the LLE. She uses a 4 wheeled walker some times at home and mostly uses her WC.   Of note, she has severe hearing loss and threw away 2 pairs of hearing aids.  Her Past Medical History Is Significant For: Past Medical  History  Diagnosis Date  . Parkinson disease   . High cholesterol     since 2000  . Hypertension     since 1985  . Back pain     lower back pain , C2 fracture from fall  . Obstructive apnea     Her Past Surgical History Is Significant For: Past Surgical History  Procedure Laterality Date  . Shoulder surgery Left     [id number]  . Lumbar spine surgery    . Cervical spine surgery      02/2011, 03/2011  . Rotary cuff Right     Her Family History Is Significant For: Family History  Problem Relation Age of Onset  . Cancer Mother   .  Congestive Heart Failure Father   . Diabetes Other   . Hypertension Other   . Hyperlipidemia Other   . Heart disease Other     Her Social History Is Significant For: History   Social History  . Marital Status: Married    Spouse Name: Dorinda Hill    Number of Children: 2  . Years of Education: HS   Occupational History  .     Social History Main Topics  . Smoking status: Never Smoker   . Smokeless tobacco: Never Used  . Alcohol Use: No  . Drug Use: No  . Sexual Activity: None   Other Topics Concern  . None   Social History Narrative   Patient lives at home with spouse.   Caffeine Use: 2 cups daily    Her Allergies Are:  No Known Allergies:   Her Current Medications Are:  Outpatient Encounter Prescriptions as of 04/18/2013  Medication Sig Dispense Refill  . atenolol-chlorthalidone (TENORETIC) 50-25 MG per tablet Take 1 tablet by mouth daily.      . carbidopa-levodopa (SINEMET IR) 25-100 MG per tablet Take one tab at 8am, one half at noon, one tab at 4pm and one half at bedtime  270 tablet  3  . clonazePAM (KLONOPIN) 0.25 MG disintegrating tablet Take 1 tablet (0.25 mg total) by mouth at bedtime as needed.  30 tablet  5  . donepezil (ARICEPT) 5 MG tablet       . HYDROcodone-acetaminophen (NORCO/VICODIN) 5-325 MG per tablet       . Linaclotide (LINZESS) 145 MCG CAPS capsule Take 145 mcg by mouth daily.      . meloxicam (MOBIC) 7.5 MG tablet Take 7.5 mg by mouth daily. As needed      . memantine (NAMENDA TITRATION PAK) tablet pack 5 mg/day for =1 week; 5 mg twice daily for =1 week; 15 mg/day given in 5 mg and 10 mg separated doses for =1 week; then 10 mg twice daily  49 tablet  12  . memantine (NAMENDA) 10 MG tablet Take 1 tablet (10 mg total) by mouth 2 (two) times daily.  60 tablet  5  . pilocarpine (SALAGEN) 7.5 MG tablet Take 7.5 mg by mouth 1 day or 1 dose.      . potassium chloride SA (K-DUR,KLOR-CON) 20 MEQ tablet Take 20 mEq by mouth daily.      . pravastatin  (PRAVACHOL) 80 MG tablet Take 80 mg by mouth daily.      Marland Kitchen tolterodine (DETROL LA) 4 MG 24 hr capsule Take 4 mg by mouth daily.      . traMADol (ULTRAM) 50 MG tablet        No facility-administered encounter medications on file as of 04/18/2013.   Review of Systems  Constitutional: Positive for unexpected weight change (weight loss).  HENT:       Hearing loss  Eyes: Positive for visual disturbance (blurred vision).  Respiratory: Positive for cough.        Snoring  Gastrointestinal: Positive for constipation.  Neurological:       Memory loss Confusion   Psychiatric/Behavioral: Positive for sleep disturbance (snoring).       Anxiety Depression Decreased energy    Objective:  Neurologic Exam  Physical Exam Physical Examination:   Filed Vitals:   04/18/13 1114  BP: 110/56  Pulse: 66  Temp: 97.5 F (36.4 C)    General Examination: The patient is a very pleasant 79 y.o. female in no acute distress. She is situated in her WC. She is frail appearing. She is extremely hard of hearing.  HEENT: Normocephalic, atraumatic, pupils are equal, round and reactive to light and accommodation. Extraocular tracking shows moderate saccadic breakdown without nystagmus noted. There is limitation to entire gaze. There is moderate decrease in eye blink rate. Hearing is severely impaired and she's not able to follow any commands by verbal cues. She can mimic. Face is symmetric with mild facial masking and normal facial sensation. There is no lip, neck or jaw tremor. Neck is In a soft brace and I did not move her neck. Oropharynx exam reveals moderate mouth dryness.   There is mild drooling.   Chest: is clear to auscultation without wheezing, rhonchi or crackles noted.  Heart: sounds are regular and normal without murmurs, rubs or gallops noted.   Abdomen: is soft, non-tender and non-distended with normal bowel sounds appreciated on auscultation.  Extremities: There is no pitting edema in the  distal lower extremities bilaterally. Pedal pulses are intact. There are no varicose veins.  Skin: is warm and dry with no trophic changes noted. Age-related changes are noted on the skin.   Musculoskeletal: exam reveals no obvious joint deformities, tenderness, joint swelling or erythema.  Neurologically:  Mental status: The patient is awake and alert, paying poor attention. She is unable to provide the history. Her husband provides the entire history. She is oriented to: person. Her memory, attention, language and knowledge are impaired significantly. There is a moderate degree of bradyphrenia. Speech is mildly hypophonic with mild dysarthria noted. Mood is congruent and affect is blunted.   Cranial nerves are as described above under HEENT exam. In addition, shoulder shrug is normal with equal shoulder height noted.  Motor exam: Normal bulk, and strength for age is noted. There are mild dyskinesias noted. These are primarily noted in the Left Lower Limb.  Tone is mildly rigid with absence of cogwheeling. There is overall moderate bradykinesia. There is no drift or rebound.  There is no tremor.   Romberg is not tested.  Reflexes are trace in the upper extremities and trace in the lower extremities.   Fine motor skills exam: Finger taps are moderately impaired on the right and moderately impaired on the left. Hand movements are moderately impaired on the right and moderately impaired on the left. RAP (rapid alternating patting) is moderately impaired on the right and moderately impaired on the left. Foot taps are moderately impaired on the right and severely impaired on the left. Foot agility (in the form of heel stomping) is moderately impaired on the right and severely impaired on the left.    Cerebellar testing shows no dysmetria or intention tremor on finger to nose testing. Heel to shin is unremarkable bilaterally. There is no truncal or  gait ataxia.   Sensory exam is intact to light touch.    Gait, station and balance: I did not have her stand or walk for me today as she did not bring her walker.  Assessment and Plan:   In summary, RAYME BUI is a very pleasant 79 y.o.-year old female with a complex history of Parkinson's disease, complicated by severe hearing loss, memory loss, RBD, motor fluctuations and motor complications. In addition she has complex sleep apnea and has not been able to tolerate CPAP. When she was placed on CPAP she still had residual sleep apnea. I had a very long chat with primarily the husband today, explaining that complexity of her condition. He is very understanding. I believe there is only limited help we can provide. She is on both donepezil and Namenda at this time and on a small dose of levodopa. I would be reluctant to increase his because she has had some lightheadedness recently. It can also complicated hallucinations and dyskinesias. Thankfully they have a good support system with her daughter living next door and a son that lives locally as well. I did not make any changes to her medication regimen today. I suggested a six-month checkup. He was in agreement. I have encouraged him to call for any interim questions, concerns, problems, updates a refill requests.

## 2013-05-22 DIAGNOSIS — Z79899 Other long term (current) drug therapy: Secondary | ICD-10-CM | POA: Diagnosis not present

## 2013-05-22 DIAGNOSIS — I10 Essential (primary) hypertension: Secondary | ICD-10-CM | POA: Diagnosis not present

## 2013-05-22 DIAGNOSIS — E789 Disorder of lipoprotein metabolism, unspecified: Secondary | ICD-10-CM | POA: Diagnosis not present

## 2013-05-28 ENCOUNTER — Ambulatory Visit: Payer: Medicare Other | Admitting: Neurology

## 2013-05-28 DIAGNOSIS — M542 Cervicalgia: Secondary | ICD-10-CM | POA: Diagnosis not present

## 2013-05-28 DIAGNOSIS — S129XXA Fracture of neck, unspecified, initial encounter: Secondary | ICD-10-CM | POA: Diagnosis not present

## 2013-05-29 DIAGNOSIS — M542 Cervicalgia: Secondary | ICD-10-CM | POA: Diagnosis not present

## 2013-05-29 DIAGNOSIS — G20 Parkinson's disease: Secondary | ICD-10-CM | POA: Diagnosis not present

## 2013-05-29 DIAGNOSIS — E789 Disorder of lipoprotein metabolism, unspecified: Secondary | ICD-10-CM | POA: Diagnosis not present

## 2013-05-29 DIAGNOSIS — Z23 Encounter for immunization: Secondary | ICD-10-CM | POA: Diagnosis not present

## 2013-05-29 DIAGNOSIS — I10 Essential (primary) hypertension: Secondary | ICD-10-CM | POA: Diagnosis not present

## 2013-06-05 DIAGNOSIS — H6120 Impacted cerumen, unspecified ear: Secondary | ICD-10-CM | POA: Diagnosis not present

## 2013-09-30 DIAGNOSIS — K5900 Constipation, unspecified: Secondary | ICD-10-CM | POA: Diagnosis not present

## 2013-09-30 DIAGNOSIS — E789 Disorder of lipoprotein metabolism, unspecified: Secondary | ICD-10-CM | POA: Diagnosis not present

## 2013-09-30 DIAGNOSIS — M79609 Pain in unspecified limb: Secondary | ICD-10-CM | POA: Diagnosis not present

## 2013-09-30 DIAGNOSIS — G20 Parkinson's disease: Secondary | ICD-10-CM | POA: Diagnosis not present

## 2013-10-20 ENCOUNTER — Ambulatory Visit (INDEPENDENT_AMBULATORY_CARE_PROVIDER_SITE_OTHER): Payer: Medicare Other | Admitting: Neurology

## 2013-10-20 ENCOUNTER — Encounter: Payer: Self-pay | Admitting: Neurology

## 2013-10-20 VITALS — BP 132/78 | HR 94 | Temp 97.6°F | Ht 64.0 in

## 2013-10-20 DIAGNOSIS — R413 Other amnesia: Secondary | ICD-10-CM | POA: Diagnosis not present

## 2013-10-20 DIAGNOSIS — G4752 REM sleep behavior disorder: Secondary | ICD-10-CM | POA: Diagnosis not present

## 2013-10-20 DIAGNOSIS — G20A1 Parkinson's disease without dyskinesia, without mention of fluctuations: Secondary | ICD-10-CM

## 2013-10-20 DIAGNOSIS — G20 Parkinson's disease: Secondary | ICD-10-CM | POA: Diagnosis not present

## 2013-10-20 DIAGNOSIS — F0280 Dementia in other diseases classified elsewhere without behavioral disturbance: Secondary | ICD-10-CM

## 2013-10-20 DIAGNOSIS — G4730 Sleep apnea, unspecified: Secondary | ICD-10-CM | POA: Diagnosis not present

## 2013-10-20 MED ORDER — MEMANTINE HCL ER 14 MG PO CP24: 14.0000 mg | ORAL_CAPSULE | Freq: Every day | ORAL | Status: DC

## 2013-10-20 MED ORDER — CLONAZEPAM 0.25 MG PO TBDP: 0.2500 mg | ORAL_TABLET | Freq: Every day | ORAL | Status: DC

## 2013-10-20 MED ORDER — CARBIDOPA-LEVODOPA 25-100 MG PO TABS: 1.0000 | ORAL_TABLET | Freq: Two times a day (BID) | ORAL | Status: DC

## 2013-10-20 NOTE — Progress Notes (Signed)
Subjective:    Patient ID: Kathleen Haynes is a 80 y.o. female.  HPI   Interim history:   [name] is a very pleasant 80 year old right-handed woman with an underlying medical history of hyperlipidemia, hypertension, OSA, chronic back pain, C2 fracture from a fall, who presents for followup consultation of her left-sided predominant Parkinson's disease, complicated by severe hearing loss, urinary incontinence, chronic constipation, recurrent falls, memory loss, RBD, hallucinations, motor fluctuations, motor complications, complex sleep apnea with CPAP intolerance. She is accompanied by her husband again today. I first met her on 04/18/2013, which time I talked to her husband a long time about the complexity of her issues. I was reluctant to increase her levodopa for fear of side effects including exacerbation of her hallucinations.   Today, her husband states, he gives her Namenda 10 mg strength, 1 pill daily, alternating with 1 bid. She no longer is on Aricept 5 mg for unclear reasons. She has been on low dose C/L. Her husband provides the Hx. She uses a rolling walker with a seat. She has not fallen, she uses the [facility] more often. She has had no recent illness. She does not drink enough water. She has dream enactments. She won't use the soft collar, stating they are dirty. He tried to have her use CPAP as best as he could. She has a brother with OSA on CPAP.   She previously followed with Dr. Morene Antu and was last seen by Dr. Erling Cruz on 09/20/2012, at which time he started donepezil for dementia and did some blood work. He decreased her Sinemet to one tablet, alternating with half a tablet 4 times a day for a total of 300 mg of levodopa to help with dyskinesias and hallucinations. He did not start her on amantadine because of her dementia. She was also advised to start CPAP for obstructive sleep apnea. She has been on donepezil 5 mg daily, tramadol, meloxicam, multivitamin, stool softener, Detrol LA,  pilocarpine, pravastatin, atenolol-chlorthalidone, Klor-Con, Sinemet 1 tablet 8, half a tablet at noon, 1 tablet at 4 PM and half a tablet at bedtime. She had B12, TSH, RPR tested in January 2014, which were normal.  She was diagnosed with left-sided Parkinson's disease in 1999 and started on Sinemet at the time. She started having dyskinesias and started using a rolling walker with a seat and a wheelchair. She has had recurrent falls and freezing spells and festination and did not improve with selegiline which was stopped. She developed constipation and urinary incontinence and does not exercise regularly. She had hallucinations and delusions. She fell and hit her head in May 2012 and was seen in the emergency room. She was found to have a C2 vertebral fracture and CT head without contrast showed generalized atrophy. She had surgery by Dr. Vertell Limber in July 2012 and August 2012 she started having memory loss in 2012. In December 2012 her MMSE was 24, clock drawing was 4, and normal fluency was 9. She does not sleep well at night and takes multiple naps during the day. She was diagnosed with complex sleep apnea in March 2012 without REM onset behavior disorder. She had a repeat sleep study in April 2012 for CPAP titration but then never started CPAP. She had frequent PLMS. She takes meloxicam and tramadol for her residual head and neck pain.  She saw Dr. Brett Fairy in April of this year and was encouraged to use her CPAP. Dr. Brett Fairy reviewed her sleep test results and compliance data with the  patient and her husband at the time.  She has been wearing a soft neck brace. She has a good support system with daughter living next door. She has hallucinations especially at night. She has not been using her CPAP and it was taken back. She continually pulled off the mask in the middle of the night and the husband would wake up with a leaking air noise. He has tried but has not been able to make her use the CPAP. She used it  19/30 days and mostly for 1 to 2 hours at a time and she still had a high residual AHI. She has been on Namenda and Aricept. She has dyskinesias on the LLE. She uses a 4 wheeled walker some times at home and mostly uses her WC. Of note, she has severe hearing loss and threw away 2 pairs of hearing aids.  Her Past Medical History Is Significant For: Past Medical History  Diagnosis Date  . Parkinson disease   . High cholesterol     since 2000  . Hypertension     since 1985  . Back pain     lower back pain , C2 fracture from fall  . Obstructive apnea     Her Past Surgical History Is Significant For: Past Surgical History  Procedure Laterality Date  . Shoulder surgery Left     [id number]  . Lumbar spine surgery    . Cervical spine surgery      02/2011, 03/2011  . Rotary cuff Right     Her Family History Is Significant For: Family History  Problem Relation Age of Onset  . Cancer Mother   . Congestive Heart Failure Father   . Diabetes Other   . Hypertension Other   . Hyperlipidemia Other   . Heart disease Other     Her Social History Is Significant For: History   Social History  . Marital Status: Married    Spouse Name: [name]    Number of Children: 2  . Years of Education: HS   Occupational History  .     Social History Main Topics  . Smoking status: Never Smoker   . Smokeless tobacco: Never Used  . Alcohol Use: No  . Drug Use: No  . Sexual Activity: Not on file   Other Topics Concern  . Not on file   Social History Narrative   Patient lives at home with spouse.   Caffeine Use: 2 cups daily    Her Allergies Are:  No Known Allergies:   Her Current Medications Are:  Outpatient Encounter Prescriptions as of 10/20/2013  Medication Sig  . atenolol-chlorthalidone (TENORETIC) 50-25 MG per tablet Take 1 tablet by mouth daily.  . carbidopa-levodopa (SINEMET IR) 25-100 MG per tablet Take 1 tablet by mouth 2 (two) times daily. Take one tab at 8am, one half at  noon, one tab at 4pm and one half at bedtime  . clonazePAM (KLONOPIN) 0.25 MG disintegrating tablet Take 1 tablet (0.25 mg total) by mouth at bedtime.  . Linaclotide (LINZESS) 145 MCG CAPS capsule Take 145 mcg by mouth daily.  . pilocarpine (SALAGEN) 7.5 MG tablet Take 7.5 mg by mouth 1 day or 1 dose.  . potassium chloride SA (K-DUR,KLOR-CON) 20 MEQ tablet Take 20 mEq by mouth daily.  . pravastatin (PRAVACHOL) 80 MG tablet Take 80 mg by mouth daily.  Marland Kitchen tolterodine (DETROL LA) 4 MG 24 hr capsule Take 4 mg by mouth daily.  . [DISCONTINUED] carbidopa-levodopa (SINEMET IR) 25-100 MG  per tablet Take one tab at 8am, one half at noon, one tab at 4pm and one half at bedtime  . [DISCONTINUED] clonazePAM (KLONOPIN) 0.25 MG disintegrating tablet Take 1 tablet (0.25 mg total) by mouth at bedtime as needed.  . [DISCONTINUED] memantine (NAMENDA) 10 MG tablet Take 1 tablet (10 mg total) by mouth 2 (two) times daily.  . Memantine HCl ER (NAMENDA XR) 14 MG CP24 Take 1 tablet (14 mg total) by mouth daily.  . traMADol (ULTRAM) 50 MG tablet   . [DISCONTINUED] donepezil (ARICEPT) 5 MG tablet   . [DISCONTINUED] HYDROcodone-acetaminophen (NORCO/VICODIN) 5-325 MG per tablet   . [DISCONTINUED] meloxicam (MOBIC) 7.5 MG tablet Take 7.5 mg by mouth daily. As needed  . [DISCONTINUED] memantine (NAMENDA TITRATION PAK) tablet pack 5 mg/day for =1 week; 5 mg twice daily for =1 week; 15 mg/day given in 5 mg and 10 mg separated doses for =1 week; then 10 mg twice daily  :  Review of Systems:  Out of a complete 14 point review of systems, all are reviewed and negative with the exception of these symptoms as listed below:   Review of Systems  Constitutional: Positive for appetite change.  HENT: Positive for hearing loss.   Eyes: Positive for discharge.  Respiratory: Negative.   Cardiovascular: Negative.   Gastrointestinal: Positive for constipation.  Endocrine: Negative.   Genitourinary: Positive for frequency.   Musculoskeletal: Positive for gait problem.  Skin: Negative.   Allergic/Immunologic: Negative.   Neurological:       Memory loss  Hematological: Negative.   Psychiatric/Behavioral: Positive for sleep disturbance (snoring, sleep talking, eds, restless leg).    Objective:  Neurologic Exam  Physical Exam Physical Examination:   Filed Vitals:   10/20/13 1248  BP: 132/78  Pulse: 94  Temp: 97.6 F (36.4 C)    General Examination: The patient is a very pleasant 80 y.o. female in no acute distress. She is situated in her WC. She is frail appearing. She is extremely hard of hearing.   HEENT: Normocephalic, atraumatic, pupils are equal, round and reactive to light and accommodation. Extraocular tracking shows moderate saccadic breakdown without nystagmus noted. There is limitation to entire gaze. There is moderate decrease in eye blink rate. Hearing is severely impaired and she's not able to follow any commands by verbal cues. She can mimic. Face is symmetric with mild facial masking and normal facial sensation. There is no lip, neck or jaw tremor. Neck was not moved passively. She does not have her soft brace today. Oropharynx exam reveals moderate mouth dryness.   There is mild drooling.   Chest: is clear to auscultation without wheezing, rhonchi or crackles noted.  Heart: sounds are regular and normal without murmurs, rubs or gallops noted.   Abdomen: is soft, non-tender and non-distended with normal bowel sounds appreciated on auscultation.  Extremities: There is no pitting edema in the distal lower extremities bilaterally. Pedal pulses are intact. There are no varicose veins.  Skin: is warm and dry with no trophic changes noted. Age-related changes are noted on the skin.   Musculoskeletal: exam reveals no obvious joint deformities, tenderness, joint swelling or erythema.  Neurologically:  Mental status: The patient is awake and alert, paying poor attention. She is unable to  provide the history. Her husband provides the entire history. She is oriented to: person. Her memory, attention, language and knowledge are impaired significantly. There is a moderate degree of bradyphrenia. Speech is scant, mildly hypophonic with mild dysarthria noted. Mood is  congruent and affect is blunted.   Cranial nerves are as described above under HEENT exam. In addition, shoulder shrug is normal with equal shoulder height noted.  Motor exam: Normal bulk, and strength for age is noted. There are mild dyskinesias noted. These are primarily noted in the Left Lower Limb.  Tone is mildly rigid with absence of cogwheeling. There is overall moderate bradykinesia. There is no drift or rebound.  There is no tremor.   Romberg is not tested.  Reflexes are trace in the upper extremities and trace in the lower extremities.   Fine motor skills exam: Finger taps are moderately impaired on the right and moderately impaired on the left. Hand movements are moderately impaired on the right and moderately impaired on the left. RAP (rapid alternating patting) is moderately impaired on the right and moderately impaired on the left. Foot taps are moderately impaired on the right and severely impaired on the left. Foot agility (in the form of heel stomping) is moderately impaired on the right and severely impaired on the left.    Cerebellar testing shows no dysmetria or intention tremor on finger to nose testing. Heel to shin is unremarkable bilaterally. There is no truncal or gait ataxia.   Sensory exam is intact to light touch.   Gait, station and balance: She stands up from the wheelchair with maximum assistance provided on both sides by her husband and myself. She has a tendency to fall backwards into her wheelchair. She has the ability to walk briefly for [place] with assistance. She does not pick up her leg well and has trouble turning. We have to back off her wheelchair into her to help her set back down safely.  Her balance is markedly impaired.  Assessment and Plan:   In summary, Kathleen Haynes is a very pleasant 80 year old female who presents for followup consultation of her advanced, left-sided predominant Parkinson's disease, complicated by severe hearing loss, memory loss, RBD, motor fluctuations and motor complications, and complex sleep apnea with intolerance to CPAP. When she was placed on CPAP she still had residual sleep apnea. I had a very long chat with primarily the husband again today, explaining the complexity of her condition. He is very understanding. I believe there is only limited help I can provide at this point. She is no longer on donepezil for unclear reasons and Namenda is 10 mg once daily alternating with 10 mg twice daily every other day. She continues on a small dose of levodopa, and she needed a dose reduction in the past because of hallucinations and dyskinesias. She has been more wheelchair dependent. She is at significant fall risk. Thankfully they have a good support system with her daughter living next door and a son that lives locally and helps out as well. I did not make any changes to her medication regimen today with the exception of changing Namenda to Namenda XR 14 mg once daily. I suggested a 51-monthcheckup. He was in agreement. I have encouraged him to call for any interim questions, concerns, problems, updates a refill requests.

## 2013-10-20 NOTE — Patient Instructions (Addendum)
I think your Parkinson's disease has remained fairly stable, which is reassuring. Nevertheless, as you know, this disease does progress with time. It can affect your balance, your memory, your mood, your bowel and bladder function, your posture, balance and walking. Overall you are doing fairly well but I do want to suggest a few things today:  Remember to drink plenty of fluid, eat healthy meals and do not skip any meals. Try to eat protein with a every meal and eat a healthy snack such as fruit or nuts in between meals. Try to keep a regular sleep-wake schedule and try to exercise daily, particularly in the form of walking, 10-20 minutes a day, if you can. Use your walker at all times. Drink more water!  Taking your medication on schedule is key.   Try to stay active physically and mentally. Engage in social activities in your community and with your family and try to keep up with current events by reading the newspaper or watching the news. Try to do word puzzles and you may like to do word puzzles and brain games on the computer such as on [email or web address]   As far as your medications are concerned, I would like to suggest that you take your current medication with the following additional changes:  We will change Namenda to Namenda XR 14 mg once daily.   As far as diagnostic testing, I will order: no new test.   I would like to see you back in 4 months, sooner if we need to. Please call us with any interim questions, concerns, problems, updates or refill requests.  Our nursing staff will answer any of your questions and relay your messages to me and also relay most of my messages to you.  Our phone number is [phone]. We also have an after hours call service for urgent matters and there is a physician on-call for urgent questions, that cannot wait till the next work day. For any emergencies you know to call 911 or go to the nearest emergency room.

## 2013-12-08 ENCOUNTER — Telehealth: Payer: Self-pay | Admitting: *Deleted

## 2013-12-09 NOTE — Telephone Encounter (Signed)
Called pt and spoke with pt's husband [name] informing him that we needed the pt to bring the jury summons to the office, before the Doctor could see if they could write a letter. Pt's husband verbalized understanding.

## 2013-12-11 ENCOUNTER — Encounter: Payer: Self-pay | Admitting: *Deleted

## 2014-01-28 DIAGNOSIS — G20 Parkinson's disease: Secondary | ICD-10-CM | POA: Diagnosis not present

## 2014-01-28 DIAGNOSIS — K5900 Constipation, unspecified: Secondary | ICD-10-CM | POA: Diagnosis not present

## 2014-01-28 DIAGNOSIS — M542 Cervicalgia: Secondary | ICD-10-CM | POA: Diagnosis not present

## 2014-03-05 ENCOUNTER — Telehealth: Payer: Self-pay | Admitting: Neurology

## 2014-03-05 DIAGNOSIS — R413 Other amnesia: Secondary | ICD-10-CM

## 2014-03-05 MED ORDER — MEMANTINE HCL ER 14 MG PO CP24: 14.0000 mg | ORAL_CAPSULE | Freq: Every day | ORAL | Status: DC

## 2014-03-05 NOTE — Telephone Encounter (Signed)
Spouse stated insurance will not pay for 30 day supply of Memantine HCl ER (NAMENDA XR) 14 MG CP24.  Need new Rx for 90 day supply.  Please call and advise.

## 2014-03-05 NOTE — Telephone Encounter (Signed)
Rx has been updated to 90 day supply.  I called the patient back, they are aware.

## 2014-03-12 ENCOUNTER — Encounter: Payer: Self-pay | Admitting: Neurology

## 2014-03-12 ENCOUNTER — Ambulatory Visit (INDEPENDENT_AMBULATORY_CARE_PROVIDER_SITE_OTHER): Payer: Medicare Other | Admitting: Neurology

## 2014-03-12 VITALS — BP 119/70 | HR 82 | Temp 98.2°F | Ht 64.0 in

## 2014-03-12 DIAGNOSIS — R443 Hallucinations, unspecified: Secondary | ICD-10-CM

## 2014-03-12 DIAGNOSIS — G4730 Sleep apnea, unspecified: Secondary | ICD-10-CM | POA: Diagnosis not present

## 2014-03-12 DIAGNOSIS — G4752 REM sleep behavior disorder: Secondary | ICD-10-CM

## 2014-03-12 DIAGNOSIS — G20 Parkinson's disease: Secondary | ICD-10-CM | POA: Diagnosis not present

## 2014-03-12 DIAGNOSIS — H9190 Unspecified hearing loss, unspecified ear: Secondary | ICD-10-CM

## 2014-03-12 DIAGNOSIS — G20A1 Parkinson's disease without dyskinesia, without mention of fluctuations: Secondary | ICD-10-CM

## 2014-03-12 DIAGNOSIS — R413 Other amnesia: Secondary | ICD-10-CM

## 2014-03-12 DIAGNOSIS — H9193 Unspecified hearing loss, bilateral: Secondary | ICD-10-CM

## 2014-03-12 DIAGNOSIS — F0280 Dementia in other diseases classified elsewhere without behavioral disturbance: Secondary | ICD-10-CM

## 2014-03-12 MED ORDER — DONEPEZIL HCL 5 MG PO TABS: 5.0000 mg | ORAL_TABLET | Freq: Every day | ORAL | Status: DC

## 2014-03-12 MED ORDER — MEMANTINE HCL ER 14 MG PO CP24: 14.0000 mg | ORAL_CAPSULE | Freq: Every day | ORAL | Status: DC

## 2014-03-12 MED ORDER — CARBIDOPA-LEVODOPA 25-100 MG PO TABS: 1.0000 | ORAL_TABLET | Freq: Two times a day (BID) | ORAL | Status: DC

## 2014-03-12 NOTE — Patient Instructions (Addendum)
Let's continue with Namenda XR 14 mg.   We will restart Aricept (generic name: donepezil) 5 mg: take one pill each evening. Watch for potential side effects: Common side effects include dry eyes, dry mouth, confusion, low pulse, low blood pressure and rare side effects include hallucinations. For her, you will watch out for: complaints of dizziness, more hallucinations, personality changes. Call us in one month for an update.  Do not walk without assistance.   Followup in 3 months with a Kathleen Haynes, our nurse practitioner and in 6 months with me.

## 2014-03-12 NOTE — Progress Notes (Signed)
Subjective:    Patient ID: Kathleen Haynes is a 80 y.o. female.  HPI    Interim history:  Kathleen Haynes is a very pleasant 80 year old right-handed woman with an underlying medical history of hyperlipidemia, hypertension, OSA, chronic back pain, and C2 fracture from a fall, who presents for followup consultation of her left-sided predominant Parkinson's disease, complicated by severe hearing loss, urinary incontinence, chronic constipation, recurrent falls, memory loss, RBD, hallucinations, motor fluctuations, motor complications, complex sleep apnea with CPAP intolerance. She is accompanied by her husband again today. I last saw her on 10/20/13, at which time I talked to them at length about her complex situation and limitation in what we can do with medical management. She had been on low-dose levodopa and needed adjustment in the past for hallucinations. We talked about her fall risk and she was essentially wheelchair dependent at the time. I did not make any changes to her medications with the exception of changing her to long-acting Namenda once daily 14 mg.  Today, he reports, she does not like to wear the soft collar. She uses the [facility] for a walker. She has not fallen recently. He does not know, why she is no longer on the Aricept. She has RBD and nearly daily hallucinations. No recent Hx of lightheadedness. She no longer sees Dr. Vertell Limber in [place]. Memory loss is stable. She has been tolerating the Namenda XR 14 mg. She is on C/L 1 pill twice daily. He could split the pills without crushing. He gives her Ensure as well. Their daughter helps out and their son lives a mile away and the 3 GC come and visit.   I first met her on 04/18/2013, which time I talked to her husband a long time about the complexity of her issues. I was reluctant to increase her levodopa for fear of side effects including exacerbation of her hallucinations.  She previously followed with Dr. Morene Antu and was last seen by Dr. Erling Cruz on  09/20/2012, at which time he started donepezil for dementia and did some blood work. He decreased her Sinemet to one tablet, alternating with half a tablet 4 times a day for a total of 300 mg of levodopa to help with dyskinesias and hallucinations. He did not start her on amantadine because of her dementia. She was also advised to start CPAP for obstructive sleep apnea. She has been on donepezil 5 mg daily, tramadol, meloxicam, multivitamin, stool softener, Detrol LA, pilocarpine, pravastatin, atenolol-chlorthalidone, Klor-Con, Sinemet 1 tablet 8, half a tablet at noon, 1 tablet at 4 PM and half a tablet at bedtime. She had B12, TSH, RPR tested in January 2014, which were normal.  She was diagnosed with left-sided Parkinson's disease in 1999 and started on Sinemet at the time. She started having dyskinesias and started using a rolling walker with a seat and a wheelchair. She has had recurrent falls and freezing spells and festination and did not improve with selegiline which was stopped. She developed constipation and urinary incontinence and does not exercise regularly. She had hallucinations and delusions. She fell and hit her head in May 2012 and was seen in the emergency room. She was found to have a C2 vertebral fracture and CT head without contrast showed generalized atrophy. She had surgery by Dr. Vertell Limber in July 2012 and August 2012 she started having memory loss in 2012. In December 2012 her MMSE was 24, clock drawing was 4, and normal fluency was 9. She does not sleep well at night  and takes multiple naps during the day. She was diagnosed with complex sleep apnea in March 2012 without REM onset behavior disorder. She had a repeat sleep study in April 2012 for CPAP titration but then never started CPAP. She had frequent PLMS. She takes meloxicam and tramadol for her residual head and neck pain.  She saw Dr. Brett Fairy in April 2014, and was encouraged to use her CPAP. Dr. Brett Fairy reviewed her sleep test  results and compliance data with the patient and her husband at the time.  Her high cervical fracture was treated with a brace. She has a good support system with daughter living next door. She has hallucinations especially at night. She has not been using her CPAP and it was taken back. She continually pulled off the mask in the middle of the night and the husband would wake up with a leaking air noise. He has tried but has not been able to make her use the CPAP. She used it 19/30 days and mostly for 1 to 2 hours at a time and she still had a high residual AHI. She has been on Namenda and Aricept. She has dyskinesias on the LLE. Of note, she has severe hearing loss and threw away 2 pairs of hearing aids.   Her Past Medical History Is Significant For: Past Medical History  Diagnosis Date  . Parkinson disease   . High cholesterol     since 2000  . Hypertension     since 1985  . Back pain     lower back pain , C2 fracture from fall  . Obstructive apnea     Her Past Surgical History Is Significant For: Past Surgical History  Procedure Laterality Date  . Shoulder surgery Left     [id number]  . Lumbar spine surgery    . Cervical spine surgery      02/2011, 03/2011  . Rotary cuff Right     Her Family History Is Significant For: Family History  Problem Relation Age of Onset  . Cancer Mother   . Congestive Heart Failure Father   . Diabetes Other   . Hypertension Other   . Hyperlipidemia Other   . Heart disease Other     Her Social History Is Significant For: History   Social History  . Marital Status: Married    Spouse Name: [name]    Number of Children: 2  . Years of Education: HS   Occupational History  .     Social History Main Topics  . Smoking status: Never Smoker   . Smokeless tobacco: Never Used  . Alcohol Use: No  . Drug Use: No  . Sexual Activity: None   Other Topics Concern  . None   Social History Narrative   Patient lives at home with spouse.    Caffeine Use: 2 cups daily    Her Allergies Are:  No Known Allergies:   Her Current Medications Are:  Outpatient Encounter Prescriptions as of 03/12/2014  Medication Sig  . atenolol-chlorthalidone (TENORETIC) 50-25 MG per tablet Take 1 tablet by mouth daily.  . carbidopa-levodopa (SINEMET IR) 25-100 MG per tablet Take 1 tablet by mouth 2 (two) times daily. Take one tab at 8am, one half at noon, one tab at 4pm and one half at bedtime  . clonazePAM (KLONOPIN) 0.25 MG disintegrating tablet Take 1 tablet (0.25 mg total) by mouth at bedtime.  Marland Kitchen HYDROcodone-acetaminophen (NORCO/VICODIN) 5-325 MG per tablet Take 1 tablet by mouth as needed.  . Linaclotide (  LINZESS) 145 MCG CAPS capsule Take 145 mcg by mouth daily.  . Memantine HCl ER (NAMENDA XR) 14 MG CP24 Take 1 tablet (14 mg total) by mouth daily.  . pilocarpine (SALAGEN) 7.5 MG tablet Take 7.5 mg by mouth 1 day or 1 dose.  . potassium chloride SA (K-DUR,KLOR-CON) 20 MEQ tablet Take 20 mEq by mouth daily.  . pravastatin (PRAVACHOL) 80 MG tablet Take 80 mg by mouth daily.  Marland Kitchen tolterodine (DETROL LA) 4 MG 24 hr capsule Take 4 mg by mouth daily.  . traMADol (ULTRAM) 50 MG tablet   :  Review of Systems:  Out of a complete 14 point review of systems, all are reviewed and negative with the exception of these symptoms as listed below:   Review of Systems  Constitutional: Negative.   HENT: Positive for hearing loss and tinnitus.   Eyes: Negative.   Respiratory: Negative.   Cardiovascular: Negative.   Gastrointestinal: Positive for constipation and blood in stool.  Endocrine: Negative.   Genitourinary: Negative.   Musculoskeletal: Positive for gait problem.  Skin: Negative.   Allergic/Immunologic: Negative.   Neurological:       Memory loss  Hematological: Negative.   Psychiatric/Behavioral: Positive for confusion, sleep disturbance (eds, snoring, sleep talking), dysphoric mood and agitation.    Objective:  Neurologic Exam  Physical  Exam Physical Examination:   Filed Vitals:   03/12/14 1144  BP: 119/70  Pulse: 82  Temp: 98.2 F (36.8 C)    General Examination: The patient is a very pleasant 80 y.o. female in no acute distress. She is situated in her WC. She is frail appearing. She is extremely hard of hearing.   HEENT: Normocephalic, atraumatic, pupils are equal, round and reactive to light and accommodation. Extraocular tracking shows moderate saccadic breakdown without nystagmus noted. There is limitation to entire gaze. There is moderate decrease in eye blink rate. Hearing is severely impaired and she is not able to follow any commands by verbal cues. She can mimic. Face is symmetric with mild facial masking and normal facial sensation. There is no lip, neck or jaw tremor. Neck was not moved passively. She does not have her soft brace today. Oropharynx exam reveals moderate mouth dryness, tongue protrudes midline.   There is no drooling.   Chest: is clear to auscultation without wheezing, rhonchi or crackles noted.  Heart: sounds are regular and normal without murmurs, rubs or gallops noted.   Abdomen: is soft, non-tender and non-distended with normal bowel sounds appreciated on auscultation.  Extremities: There is no pitting edema in the distal lower extremities bilaterally. Pedal pulses are intact. There are no varicose veins.  Skin: is warm and dry with no trophic changes noted. Age-related changes are noted on the skin.   Musculoskeletal: exam reveals no obvious joint deformities, tenderness, joint swelling or erythema.  Neurologically:  Mental status: The patient is awake and alert, paying poor attention. She is unable to provide the history. Her husband provides the entire history. She is oriented to: person. Her memory, attention, language and knowledge are impaired significantly. There is a moderate degree of bradyphrenia. Speech is scant, mildly hypophonic with mild dysarthria noted. Mood is congruent  and affect is blunted.   Cranial nerves are as described above under HEENT exam. In addition, shoulder shrug is normal with equal shoulder height noted.  Motor exam: Normal bulk, and strength for age is noted. There are mild dyskinesias noted. These are primarily noted in the Left Lower Limb and there is  L foot inversions dystonic posturing.  Tone is mildly rigid with absence of cogwheeling. There is overall moderate bradykinesia. There is no drift or rebound.  There is no tremor.   Romberg is not tested.  Reflexes are trace in the upper extremities and trace in the lower extremities.   Fine motor skills exam: Finger taps are moderately impaired on the right and moderately impaired on the left. Hand movements are moderately impaired on the right and moderately impaired on the left. RAP (rapid alternating patting) is moderately impaired on the right and moderately impaired on the left. Foot taps are moderately impaired on the right and severely impaired on the left. Foot agility (in the form of heel stomping) is moderately impaired on the right and severely impaired on the left.    Cerebellar testing shows no dysmetria or intention tremor on finger to nose testing. Heel to shin is unremarkable bilaterally. There is no truncal or gait ataxia.   Sensory exam is intact to light touch.   Gait, station and balance: I did not have her stand or walk for me today d/t fall risk. Her balance is markedly impaired as per last visit.  Assessment and Plan:   In summary, MCKINNLEY COTTIER is a very pleasant 80 year old female who presents for followup consultation of her advanced, left-sided predominant Parkinson's disease, complicated by severe hearing loss, memory loss, RBD, motor fluctuations and motor complications, and complex sleep apnea with intolerance to CPAP. I again had a very long chat with primarily the husband again today, explaining the complexity of her condition. He is very understanding. The  donepezil was stopped for unclear reasons she has been tolerating Namenda XR 14 mg strength once daily fairly well. I would like to try to restart her donepezil low-dose. To that end I provided them with a prescription and instructions. He is advised to look out for side effects such as lightheadedness, low pulse rate, low blood pressure, worsening hallucinations or personality changes. We will not make any other changes today he said be able to watch for any adverse reaction and donepezil. He was in agreement. We will continue low-dose levodopa. She needed a dose reduction in the past because of hallucinations and dyskinesias. She has been more wheelchair dependent. She is at significant fall risk and we talked about it again today. Thankfully they have a good support system with her daughter living next door and a son that lives close and helps out as well. I did not make any changes to her other medication regimen today with the exception of restarting Aricept 5 mg. I suggested a 29-monthcheckup with LCharlott Holler NP and 6 mo FU with me. He was in agreement. I have encouraged him to call for any interim questions, concerns, problems, updates a refill requests.

## 2014-05-25 DIAGNOSIS — Z79899 Other long term (current) drug therapy: Secondary | ICD-10-CM | POA: Diagnosis not present

## 2014-05-25 DIAGNOSIS — E789 Disorder of lipoprotein metabolism, unspecified: Secondary | ICD-10-CM | POA: Diagnosis not present

## 2014-05-25 DIAGNOSIS — I10 Essential (primary) hypertension: Secondary | ICD-10-CM | POA: Diagnosis not present

## 2014-06-01 DIAGNOSIS — G20 Parkinson's disease: Secondary | ICD-10-CM | POA: Diagnosis not present

## 2014-06-01 DIAGNOSIS — N3281 Overactive bladder: Secondary | ICD-10-CM | POA: Diagnosis not present

## 2014-06-01 DIAGNOSIS — I10 Essential (primary) hypertension: Secondary | ICD-10-CM | POA: Diagnosis not present

## 2014-06-01 DIAGNOSIS — Z23 Encounter for immunization: Secondary | ICD-10-CM | POA: Diagnosis not present

## 2014-06-16 ENCOUNTER — Ambulatory Visit (INDEPENDENT_AMBULATORY_CARE_PROVIDER_SITE_OTHER): Payer: Medicare Other | Admitting: Nurse Practitioner

## 2014-06-16 ENCOUNTER — Encounter: Payer: Self-pay | Admitting: Nurse Practitioner

## 2014-06-16 VITALS — BP 127/81 | HR 94 | Wt 94.8 lb

## 2014-06-16 DIAGNOSIS — G4752 REM sleep behavior disorder: Secondary | ICD-10-CM

## 2014-06-16 DIAGNOSIS — G20 Parkinson's disease: Secondary | ICD-10-CM | POA: Diagnosis not present

## 2014-06-16 DIAGNOSIS — F0280 Dementia in other diseases classified elsewhere without behavioral disturbance: Secondary | ICD-10-CM | POA: Diagnosis not present

## 2014-06-16 DIAGNOSIS — G20A1 Parkinson's disease without dyskinesia, without mention of fluctuations: Secondary | ICD-10-CM

## 2014-06-16 NOTE — Progress Notes (Addendum)
PATIENT: Kathleen Haynes DOB: Jul 23, 1927  REASON FOR VISIT: routine follow up for Parkinson's Disease with Memory Loss HISTORY FROM: patient and daughter  HISTORY OF PRESENT ILLNESS: Kathleen Haynes is a very pleasant 80 year old right-handed woman with an underlying medical history of hyperlipidemia, hypertension, OSA, chronic back pain, and C2 fracture from a fall, who presents for followup consultation of her left-sided predominant Parkinson's disease, complicated by severe hearing loss, urinary incontinence, chronic constipation, recurrent falls, dementia, RBD, hallucinations, motor fluctuations, motor complications, complex sleep apnea with CPAP intolerance.   She is accompanied by her daughter today.  Since last visit, she has been tolerating Aricept 5 mg daily and Namenda XR 14 mg daily well. She is doing ok with her current Sinemet dose, and no significant tremors or dyskinesias are present.  Daughter reports that Kathleen Haynes is eating less and refusing food often.  She did not eat at all yesterday.  Her weight has been stable at a slight 94 lbs. since last visit. She reports she has a cold with runny nose. Daughter reports she has alternating bouts of constipation and diarrhea, and is using Linzess. She is not sleeping well, waking often to use the bathroom. Her hallucinations are not any worse, but are mainly seeing bugs and thinking someone is in the house that is not. Daughter states that she is concerned for Mr. Mousseau because he has not been willing to hire additional caregivers to help with her care.  Last visit 03/12/14 with Dr. Rexene Alberts: She is accompanied by her husband again today. I last saw her on 10/20/13, at which time I talked to them at length about her complex situation and limitation in what we can do with medical management. She had been on low-dose levodopa and needed adjustment in the past for hallucinations. We talked about her fall risk and she was essentially wheelchair  dependent at the time. I did not make any changes to her medications with the exception of changing her to long-acting Namenda once daily 14 mg.  Today, he reports, she does not like to wear the soft collar. She uses the [facility] for a walker. She has not fallen recently. He does not know, why she is no longer on the Aricept. She has RBD and nearly daily hallucinations. No recent Hx of lightheadedness. She no longer sees Dr. Vertell Limber in [place]. Memory loss is stable. She has been tolerating the Namenda XR 14 mg. She is on C/L 1 pill twice daily. He could split the pills without crushing. He gives her Ensure as well. Their daughter helps out and their son lives a mile away and the 3 GC come and visit.   I first met her on 04/18/2013, which time I talked to her husband a long time about the complexity of her issues. I was reluctant to increase her levodopa for fear of side effects including exacerbation of her hallucinations.  She previously followed with Dr. Morene Antu and was last seen by Dr. Erling Cruz on 09/20/2012, at which time he started donepezil for dementia and did some blood work. He decreased her Sinemet to one tablet, alternating with half a tablet 4 times a day for a total of 300 mg of levodopa to help with dyskinesias and hallucinations. He did not start her on amantadine because of her dementia. She was also advised to start CPAP for obstructive sleep apnea. She has been on donepezil 5 mg daily, tramadol, meloxicam, multivitamin, stool softener, Detrol LA, pilocarpine, pravastatin, atenolol-chlorthalidone, Klor-Con, Sinemet  1 tablet 8, half a tablet at noon, 1 tablet at 4 PM and half a tablet at bedtime. She had B12, TSH, RPR tested in January 2014, which were normal.  She was diagnosed with left-sided Parkinson's disease in 1999 and started on Sinemet at the time. She started having dyskinesias and started using a rolling walker with a seat and a wheelchair. She has had recurrent falls and freezing spells and  festination and did not improve with selegiline which was stopped. She developed constipation and urinary incontinence and does not exercise regularly. She had hallucinations and delusions. She fell and hit her head in May 2012 and was seen in the emergency room. She was found to have a C2 vertebral fracture and CT head without contrast showed generalized atrophy. She had surgery by Dr. Vertell Limber in July 2012 and August 2012 she started having memory loss in 2012. In December 2012 her MMSE was 24, clock drawing was 4, and normal fluency was 9. She does not sleep well at night and takes multiple naps during the day. She was diagnosed with complex sleep apnea in March 2012 without REM onset behavior disorder. She had a repeat sleep study in April 2012 for CPAP titration but then never started CPAP. She had frequent PLMS. She takes meloxicam and tramadol for her residual head and neck pain.  She saw Dr. Brett Fairy in April 2014, and was encouraged to use her CPAP. Dr. Brett Fairy reviewed her sleep test results and compliance data with the patient and her husband at the time.  Her high cervical fracture was treated with a brace. She has a good support system with daughter living next door. She has hallucinations especially at night. She has not been using her CPAP and it was taken back. She continually pulled off the mask in the middle of the night and the husband would wake up with a leaking air noise. He has tried but has not been able to make her use the CPAP. She used it 19/30 days and mostly for 1 to 2 hours at a time and she still had a high residual AHI. She has been on Namenda and Aricept. She has dyskinesias on the LLE. Of note, she has severe hearing loss and threw away 2 pairs of hearing aids.   Review of Systems:  Out of a complete 14 point review of systems, all are reviewed and negative with the exception of these symptoms as listed below:  Review of Systems  Constitutional: Negative.  HENT: Positive for  hearing loss and tinnitus.  Eyes: Negative.  Respiratory: Negative.  Cardiovascular: Negative.  Gastrointestinal: Positive for constipation and blood in stool.  Endocrine: Negative.  Genitourinary: Negative.  Musculoskeletal: Positive for gait problem.  Skin: Negative.  Allergic/Immunologic: Negative.  Neurological:  Memory loss  Hematological: Negative.  Psychiatric/Behavioral: Positive for confusion, sleep disturbance (eds, snoring, sleep talking), dysphoric mood and agitation.    ALLERGIES: No Known Allergies  HOME MEDICATIONS: Outpatient Prescriptions Prior to Visit  Medication Sig Dispense Refill  . atenolol-chlorthalidone (TENORETIC) 50-25 MG per tablet Take 1 tablet by mouth daily.      . carbidopa-levodopa (SINEMET IR) 25-100 MG per tablet Take 1 tablet by mouth 2 (two) times daily.  180 tablet  3  . clonazePAM (KLONOPIN) 0.25 MG disintegrating tablet Take 1 tablet (0.25 mg total) by mouth at bedtime.  30 tablet  5  . donepezil (ARICEPT) 5 MG tablet Take 1 tablet (5 mg total) by mouth at bedtime.  90 tablet  3  . Linaclotide (LINZESS) 145 MCG CAPS capsule Take 145 mcg by mouth daily.      . Memantine HCl ER (NAMENDA XR) 14 MG CP24 Take 1 tablet (14 mg total) by mouth daily.  90 capsule  3  . potassium chloride SA (K-DUR,KLOR-CON) 20 MEQ tablet Take 20 mEq by mouth daily.      . pravastatin (PRAVACHOL) 80 MG tablet Take 80 mg by mouth daily.      Marland Kitchen tolterodine (DETROL LA) 4 MG 24 hr capsule Take 4 mg by mouth daily.      Marland Kitchen HYDROcodone-acetaminophen (NORCO/VICODIN) 5-325 MG per tablet Take 1 tablet by mouth as needed.      . pilocarpine (SALAGEN) 7.5 MG tablet Take 7.5 mg by mouth 1 day or 1 dose.      . traMADol (ULTRAM) 50 MG tablet        No facility-administered medications prior to visit.    PHYSICAL EXAM Filed Vitals:   06/16/14 1131  BP: 127/81  Pulse: 94  Weight: 94 lb 12.8 oz (43.001 kg)   General Examination: The patient is a very pleasant 80 y.o. female  in no acute distress. She is situated in her WC. She is frail appearing. She is extremely hard of hearing.  HEENT: Normocephalic, atraumatic, pupils are equal, round and reactive to light and accommodation. Extraocular tracking shows moderate saccadic breakdown without nystagmus noted. There is limitation to entire gaze. There is moderate decrease in eye blink rate. Hearing is severely impaired and she is not able to follow any commands by verbal cues. She can mimic. Face is symmetric with mild facial masking and normal facial sensation. There is no lip, neck or jaw tremor. Neck was not moved passively. She does not have her soft brace today. Oropharynx exam reveals moderate mouth dryness, tongue protrudes midline.  There is no drooling.  Chest: is clear to auscultation without wheezing, rhonchi or crackles noted.  Heart: sounds are regular and normal without murmurs, rubs or gallops noted.  Abdomen: is soft, non-tender and non-distended with normal bowel sounds appreciated on auscultation.  Extremities: There is no pitting edema in the distal lower extremities bilaterally. Pedal pulses are intact. There are no varicose veins.  Skin: is warm and dry with no trophic changes noted. Age-related changes are noted on the skin.  Musculoskeletal: exam reveals no obvious joint deformities, tenderness, joint swelling or erythema.  Neurologically:  Mental status: The patient is awake and alert, paying poor attention. She is unable to provide the history. Her daughter provides the entire history. She is oriented to: person. Her memory, attention, language and knowledge are impaired significantly. There is a moderate degree of bradyphrenia. Speech is scant, mildly hypophonic with mild dysarthria noted. Mood is congruent and affect is blunted.  Cranial nerves are as described above under HEENT exam. In addition, shoulder shrug is normal with equal shoulder height noted.  Motor exam: Normal bulk, and strength for age  is noted. There are mild dyskinesias noted. These are primarily noted in the Left Lower Limb and there is L foot inversions dystonic posturing.  Tone is mildly rigid with absence of cogwheeling. There is overall moderate bradykinesia. There is no drift or rebound.  There is no tremor.  Romberg is not tested.  Reflexes are trace in the upper extremities and trace in the lower extremities.  Fine motor skills exam: Finger taps are moderately impaired on the right and moderately impaired on the left. Hand movements are moderately impaired on  the right and moderately impaired on the left. RAP (rapid alternating patting) is moderately impaired on the right and moderately impaired on the left. Foot taps are moderately impaired on the right and severely impaired on the left. Foot agility (in the form of heel stomping) is moderately impaired on the right and severely impaired on the left.  Cerebellar testing shows no dysmetria or intention tremor on finger to nose testing. Heel to shin is unremarkable bilaterally. There is no truncal or gait ataxia.  Sensory exam is intact to light touch.  Gait, station and balance: I did not have her stand or walk for me today d/t fall risk. Her balance is markedly impaired as per last visit.    ASSESSMENT: In summary, JAVONA BERGEVIN is a very pleasant 80 year old female who presents for followup consultation of her advanced, left-sided predominant Parkinson's disease, complicated by severe hearing loss, dementia, RBD, motor fluctuations and motor complications, and complex sleep apnea with intolerance to CPAP.   I had a very long chat with primarily the daughter today, explaining the complexity of her condition. She had many questions about what they could expect in the future with her condition. She has become very frail and is refusing food much of the time. She has been tolerating Namenda XR 14 mg strength once daily fairly well. Donepezil low-dose was restarted at last  visit and she has tolerated this well without further weight loss.  He is advised to look out for side effects such as lightheadedness, low pulse rate, low blood pressure, worsening hallucinations or personality changes. We will not make any other changes today. She was in agreement. We will continue low-dose levodopa. She needed a dose reduction in the past because of hallucinations and dyskinesias. She has been more wheelchair dependent. She is at significant fall risk and we talked about it again today. Thankfully they have a good support system with her daughter living next door and a son that lives close and helps out as well.   PLAN: She will continue Aricept 5 mg and Namenda XR 14 mg daily. She will continue her current dose of Sinemet 25/100 mg, 1 tablet twice daily.  She will see Dr. Rexene Alberts for an already scheduled appointment in 3 months on 09/14/14 at 12 pm.  Daughter was in agreement. I have encouraged her to call for any interim questions, concerns, problems, updates and refill requests.   Rudi Rummage LAM, MSN, FNP-BC, A/GNP-C 06/16/2014, 12:28 PM [facility] [address], [place], [place] [address] [phone]  Note: This document was prepared with digital dictation and possible smart phrase technology. Any transcriptional errors that result from this process are unintentional.  I reviewed the above note and documentation by the Nurse Practitioner and agree with the history, physical exam, assessment and plan as outlined above. I was immediately available for face-to-face consultation.  Star Age, MD, PhD [facility])

## 2014-06-16 NOTE — Patient Instructions (Signed)
PLAN: She will continue Aricept 5 mg and Namenda XR 14 mg daily. She will continue her current dose of Sinemet 25/100 mg, 1 tablet twice daily.  She will see Dr. Rexene Alberts for an already scheduled appointment in 3 months on 09/14/14 at 12 pm.  Patient and spouse were in agreement. I have encouraged him to call for any interim questions, concerns, problems, updates a refill requests.

## 2014-09-14 ENCOUNTER — Ambulatory Visit: Payer: Medicare Other | Admitting: Neurology

## 2014-09-15 ENCOUNTER — Ambulatory Visit (INDEPENDENT_AMBULATORY_CARE_PROVIDER_SITE_OTHER): Payer: Medicare Other | Admitting: Neurology

## 2014-09-15 ENCOUNTER — Encounter: Payer: Self-pay | Admitting: Neurology

## 2014-09-15 VITALS — BP 139/71 | HR 57 | Temp 97.8°F | Ht 64.0 in | Wt 100.0 lb

## 2014-09-15 DIAGNOSIS — F0280 Dementia in other diseases classified elsewhere without behavioral disturbance: Secondary | ICD-10-CM

## 2014-09-15 DIAGNOSIS — G20 Parkinson's disease: Secondary | ICD-10-CM

## 2014-09-15 DIAGNOSIS — R443 Hallucinations, unspecified: Secondary | ICD-10-CM | POA: Diagnosis not present

## 2014-09-15 DIAGNOSIS — G4752 REM sleep behavior disorder: Secondary | ICD-10-CM

## 2014-09-15 DIAGNOSIS — G20A1 Parkinson's disease without dyskinesia, without mention of fluctuations: Secondary | ICD-10-CM

## 2014-09-15 DIAGNOSIS — R54 Age-related physical debility: Secondary | ICD-10-CM

## 2014-09-15 DIAGNOSIS — K5909 Other constipation: Secondary | ICD-10-CM

## 2014-09-15 DIAGNOSIS — K5900 Constipation, unspecified: Secondary | ICD-10-CM

## 2014-09-15 DIAGNOSIS — R413 Other amnesia: Secondary | ICD-10-CM | POA: Diagnosis not present

## 2014-09-15 MED ORDER — LINACLOTIDE 145 MCG PO CAPS: 145.0000 ug | ORAL_CAPSULE | Freq: Every day | ORAL | Status: DC

## 2014-09-15 MED ORDER — CARBIDOPA-LEVODOPA 25-100 MG PO TABS: 1.0000 | ORAL_TABLET | Freq: Two times a day (BID) | ORAL | Status: DC

## 2014-09-15 MED ORDER — MEMANTINE HCL ER 14 MG PO CP24: 14.0000 mg | ORAL_CAPSULE | Freq: Every day | ORAL | Status: DC

## 2014-09-15 MED ORDER — DONEPEZIL HCL 5 MG PO TABS: 5.0000 mg | ORAL_TABLET | Freq: Every day | ORAL | Status: DC

## 2014-09-15 NOTE — Patient Instructions (Signed)
Please reduce your Pepsi intake and drink more water. Use your rolling walker at all times.  We will continue with the medications at the current doses.  We will do memory testing next time and decide whether to increase one of your memory medications.  Follow up in about 4 months.

## 2014-09-15 NOTE — Progress Notes (Signed)
Subjective:    Patient ID: Kathleen Haynes is a 81 y.o. female.  HPI     Interim history:   Kathleen Haynes is a very pleasant 81 year old right-handed woman with an underlying medical history of hyperlipidemia, hypertension, OSA, chronic back pain, and C2 fracture from a fall, who presents for followup consultation of her left-sided predominant Parkinson's disease, complicated by severe hearing loss, urinary incontinence, chronic constipation, recurrent falls, memory loss, RBD, hallucinations, motor fluctuations, motor complications, and complex sleep apnea with CPAP intolerance. She is accompanied by her husband again today. I last saw her on 03/12/2014, at which time her husband reported that she does not like to wear her soft neck collar. She was using the wheelchair for a walker. He reported no recent falls. She was no longer on Aricept. She was having REM behavior disorder and nearly daily hallucinations. Her memory was deemed stable per him. She was on Namenda long-acting 14 mg once daily, carbidopa levodopa 1 pill twice daily, and I restarted her on Aricept 5 mg once daily. In the interim, she was seen by our nurse practitioner, Kathleen Haynes, on 06/16/2014, at which time she was continued on the current medication regimen and she was on Linzess for chronic constipation.  Today, he reports no recent falls. Her constipation is better, but she still does not drink enough water. She asks for Pepsi throughout the day, or will get it herself from the fridge. He estimates that she drinks less than a glass of water altogether throughout the day and maybe one glass of ice water throughout the night as she has mouth dryness at night. She has vivid dreams and often talks in her sleeps and moves her arms and legs but has not fallen out of bed or fallen in general. She uses her walker at home or her wheelchair as a walker but sometimes just holds onto things. He realizes that it is not safe for her to just walk without  any assistance. He feels her memory is stable. She seems to be tolerating Aricept 5 mg and Namenda long-acting 14 mg once daily and she continues to take carbidopa-levodopa 1 pill twice daily. She takes Linzess once daily in the morning on an empty stomach. She usually drinks one and short daily. She can feed herself typically. She still has visual hallucinations but they're not threatening or scary to her. Their daughter lives next door and their son lives in the area as well.  I saw her on 10/20/13, at which time I talked to them at length about her complex situation and limitation in what we can do with medical management. She had been on low-dose levodopa and needed adjustment in the past for hallucinations. We talked about her fall risk and she was essentially wheelchair dependent at the time. I did not make any changes to her medications with the exception of changing her to long-acting Namenda once daily 14 mg.  She no longer sees Kathleen Haynes in [place]. Memory loss is stable. She has been tolerating the Namenda XR 14 mg. She is on C/L 1 pill twice daily. He could split the pills without crushing. He gives her Ensure as well. Their daughter helps out and their son lives a mile away and the 3 GC come and visit.   I first met her on 04/18/2013, which time I talked to her husband a long time about the complexity of her issues. I was reluctant to increase her levodopa for fear of side effects including  exacerbation of her hallucinations.   She previously followed with Kathleen Haynes and was last seen by Kathleen Haynes on 09/20/2012, at which time he started donepezil for dementia and did some blood work. He decreased her Sinemet to one tablet, alternating with half a tablet 4 times a day for a total of 300 mg of levodopa to help with dyskinesias and hallucinations. He did not start her on amantadine because of her dementia. She was also advised to start CPAP for obstructive sleep apnea. She has been on donepezil 5 mg  daily, tramadol, meloxicam, multivitamin, stool softener, Detrol LA, pilocarpine, pravastatin, atenolol-chlorthalidone, Klor-Con, Sinemet 1 tablet 8, half a tablet at noon, 1 tablet at 4 PM and half a tablet at bedtime. She had B12, TSH, RPR tested in January 2014, which were normal.   She was diagnosed with left-sided Parkinson's disease in 1999 and started on Sinemet at the time. She started having dyskinesias and started using a rolling walker with a seat and a wheelchair. She has had recurrent falls and freezing spells and festination and did not improve with selegiline which was stopped. She developed constipation and urinary incontinence and does not exercise regularly. She had hallucinations and delusions. She fell and hit her head in May 2012 and was seen in the emergency room. She was found to have a C2 vertebral fracture and CT head without contrast showed generalized atrophy. She had surgery by Kathleen Haynes in July 2012 and August 2012 she started having memory loss in 2012. In December 2012 her MMSE was 24, clock drawing was 4, and normal fluency was 9. She does not sleep well at night and takes multiple naps during the day. She was diagnosed with complex sleep apnea in March 2012 without REM onset behavior disorder. She had a repeat sleep study in April 2012 for CPAP titration but then never started CPAP. She had frequent PLMS. She takes meloxicam and tramadol for her residual head and neck pain.   She saw Kathleen Haynes in April 2014, and was encouraged to use her CPAP. Kathleen Haynes reviewed her sleep test results and compliance data with the patient and her husband at the time.   Her high cervical fracture was treated with a brace. She has a good support system with daughter living next door. She has hallucinations especially at night. She has not been using her CPAP and it was taken back. She continually pulled off the mask in the middle of the night and the husband would wake up with a leaking air  noise. He has tried but has not been able to make her use the CPAP. She used it 19/30 days and mostly for 1 to 2 hours at a time and she still had a high residual AHI. She has been on Namenda and Aricept. She has dyskinesias on the LLE. Of note, she has severe hearing loss and threw away 2 pairs of hearing aids.   Her Past Medical History Is Significant For: Past Medical History  Diagnosis Date  . Parkinson disease   . High cholesterol     since 2000  . Hypertension     since 1985  . Back pain     lower back pain , C2 fracture from fall  . Obstructive apnea     Her Past Surgical History Is Significant For: Past Surgical History  Procedure Laterality Date  . Shoulder surgery Left     [id number]  . Lumbar spine surgery    . Cervical spine  surgery      02/2011, 03/2011  . Rotary cuff Right     Her Family History Is Significant For: Family History  Problem Relation Age of Onset  . Cancer Mother   . Congestive Heart Failure Father   . Diabetes Other   . Hypertension Other   . Hyperlipidemia Other   . Heart disease Other     Her Social History Is Significant For: History   Social History  . Marital Status: Married    Spouse Name: Kathleen Haynes    Number of Children: 2  . Years of Education: HS   Occupational History  .     Social History Main Topics  . Smoking status: Never Smoker   . Smokeless tobacco: Never Used  . Alcohol Use: No  . Drug Use: No  . Sexual Activity: None   Other Topics Concern  . None   Social History Narrative   Patient lives at home with spouse.   Caffeine Use: 2 cups daily    Her Allergies Are:  No Known Allergies:   Her Current Medications Are:  Outpatient Encounter Prescriptions as of 09/15/2014  Medication Sig  . atenolol-chlorthalidone (TENORETIC) 50-25 MG per tablet Take 1 tablet by mouth daily.  . carbidopa-levodopa (SINEMET IR) 25-100 MG per tablet Take 1 tablet by mouth 2 (two) times daily.  Marland Kitchen donepezil (ARICEPT) 5 MG tablet Take  1 tablet (5 mg total) by mouth at bedtime.  . Linaclotide (LINZESS) 145 MCG CAPS capsule Take 145 mcg by mouth daily.  . Memantine HCl ER (NAMENDA XR) 14 MG CP24 Take 1 tablet (14 mg total) by mouth daily.  . potassium chloride SA (K-DUR,KLOR-CON) 20 MEQ tablet Take 20 mEq by mouth daily.  . pravastatin (PRAVACHOL) 80 MG tablet Take 80 mg by mouth daily.  Marland Kitchen tolterodine (DETROL LA) 4 MG 24 hr capsule Take 4 mg by mouth daily.  . traMADol (ULTRAM) 50 MG tablet   . [DISCONTINUED] clonazePAM (KLONOPIN) 0.25 MG disintegrating tablet Take 1 tablet (0.25 mg total) by mouth at bedtime. (Patient not taking: Reported on 09/15/2014)  . [DISCONTINUED] LINZESS 290 MCG CAPS capsule   :  Review of Systems:  Out of a complete 14 point review of systems, all are reviewed and negative with the exception of these symptoms as listed below:   Review of Systems  HENT: Positive for hearing loss.   Gastrointestinal: Positive for diarrhea and constipation.  Genitourinary:       Frequency of urination  Musculoskeletal: Positive for gait problem.  Neurological:       Restless legs, daytime sleepiness, sleep talking, memory loss, speech difficulty  Psychiatric/Behavioral: Positive for hallucinations and confusion. The patient is nervous/anxious.        Depression,     Objective:  Neurologic Exam  Physical Exam Physical Examination:   Filed Vitals:   09/15/14 1124  BP: 139/71  Pulse: 57  Temp: 97.8 F (36.6 C)    General Examination: The patient is a very pleasant 81 y.o. female in no acute distress. She is situated in her WC. She is frail appearing. She is extremely hard of hearing.   HEENT: Normocephalic, atraumatic, pupils are equal, round and reactive to light and accommodation. Extraocular tracking shows moderate saccadic breakdown without nystagmus noted. There is limitation to entire gaze. There is moderate decrease in eye blink rate. Hearing is severely impaired and she is not able to follow any  commands by verbal cues, but will mimic. Face is symmetric with mild  facial masking and normal facial sensation. There is no lip, neck or jaw tremor. Neck was not moved passively. She does not have her soft brace today. Oropharynx exam reveals moderate mouth dryness, tongue protrudes midline. There is minimal pooling of saliva in the floor of her mouth.    Chest: is clear to auscultation without wheezing, rhonchi or crackles noted.  Heart: sounds are regular and normal without murmurs, rubs or gallops noted.   Abdomen: is soft, non-tender and non-distended with normal bowel sounds appreciated on auscultation.  Extremities: There is no pitting edema in the distal lower extremities bilaterally. Pedal pulses are intact. There are no varicose veins.  Skin: is warm and dry with no trophic changes noted. Age-related changes are noted on the skin.   Musculoskeletal: exam reveals no obvious joint deformities, tenderness, joint swelling or erythema.  Neurologically:  Mental status: The patient is awake and alert, paying poor attention. She is unable to provide the history. Her husband provides the entire history. She is oriented to: person. Her memory, attention, language and knowledge are impaired significantly. There is a moderate degree of bradyphrenia. Speech is scant, mildly hypophonic with mild dysarthria noted. Mood is congruent and affect is blunted.   Cranial nerves are as described above under HEENT exam. In addition, shoulder shrug is normal with equal shoulder height noted.  Motor exam: thin bulk, and strength globally 4+/5. There are mild dyskinesias noted. These are primarily noted in the Left Lower Limb and there is L foot inversion and dystonic posturing, all unchanged.  Tone is mildly rigid with absence of cogwheeling. There is overall moderate bradykinesia. There is no drift or rebound.  There is no tremor.   Romberg is not tested.  Reflexes are trace in the upper extremities and  trace in the lower extremities.   Fine motor skills exam: Finger taps are moderately impaired on the right and moderately impaired on the left. Hand movements are moderately impaired on the right and moderately impaired on the left. RAP (rapid alternating patting) is moderately impaired on the right and moderately impaired on the left. Foot taps are moderately impaired on the right and severely impaired on the left. Foot agility (in the form of heel stomping) is moderately impaired on the right and severely impaired on the left.    Cerebellar testing shows no dysmetria or intention tremor on finger to nose testing.  Sensory exam is intact to light touch.   Gait, station and balance: I did not have her stand or walk for me today d/t fall risk, as she did not bring her walker. Her balance is markedly impaired as per previous exams.   Assessment and Plan:   In summary, STELLAROSE CERNY is a very pleasant 81 year old female who presents for followup consultation of her advanced, left-sided predominant Parkinson's disease, complicated by severe hearing loss, memory loss, RBD, chronic constipation, advancing age with frailty, motor fluctuations and motor complications, and complex sleep apnea with intolerance to CPAP. I again had a long chat with primarily the husband today, explaining the complexity of her condition. He is very understanding. The donepezil was stopped for unclear reasons but she has been on it since July last year at 5 mg strength and she has been tolerating Namenda XR 14 mg strength once daily fairly well. I would like to  keep her medications the same today. We will reassess her memory scores next time and think about increasing one of her memory medications if need be. We  will continue with the Sinemet at the current dose as well as her constipation medication but I did advise her and also her husband that she should try to drink more water and less Pepsi. Maybe she can at least dilute the  Pepsi with water so it doesn't taste like water but also gives her better hydration rather than just drinking a soda. I will see her back routinely in 3 or 4 months, sooner if need be. I answered all his questions today and he was in agreement. He is encouraged to call with any interim questions, concerns or problems. I renewed her prescriptions for Aricept, Namenda, Sinemet and Linzess today for 90 days supply with 3 refills. She needed a dose reduction in the past of her Sinemet because of hallucinations and dyskinesias, therefore she has been on low-dose Sinemet since then. She has been more wheelchair dependent. She is at significant fall risk and we talked about it again today. Thankfully they have a good support system with her daughter living next door and a son that lives close and helps out as well.

## 2014-10-05 DIAGNOSIS — E789 Disorder of lipoprotein metabolism, unspecified: Secondary | ICD-10-CM | POA: Diagnosis not present

## 2014-10-05 DIAGNOSIS — K5901 Slow transit constipation: Secondary | ICD-10-CM | POA: Diagnosis not present

## 2014-10-05 DIAGNOSIS — I10 Essential (primary) hypertension: Secondary | ICD-10-CM | POA: Diagnosis not present

## 2014-10-05 DIAGNOSIS — N3281 Overactive bladder: Secondary | ICD-10-CM | POA: Diagnosis not present

## 2014-10-15 ENCOUNTER — Encounter (HOSPITAL_COMMUNITY): Payer: Medicare Other | Admitting: Anesthesiology

## 2014-10-15 ENCOUNTER — Inpatient Hospital Stay (HOSPITAL_COMMUNITY): Payer: Medicare Other

## 2014-10-15 ENCOUNTER — Encounter (HOSPITAL_COMMUNITY): Payer: Self-pay

## 2014-10-15 ENCOUNTER — Emergency Department (HOSPITAL_COMMUNITY): Payer: Medicare Other

## 2014-10-15 ENCOUNTER — Inpatient Hospital Stay (HOSPITAL_COMMUNITY)
Admission: EM | Admit: 2014-10-15 | Discharge: 2014-10-22 | DRG: 183 | Disposition: A | Discharge status: Home / Self Care | Payer: Medicare Other | Attending: General Surgery | Admitting: General Surgery

## 2014-10-15 ENCOUNTER — Encounter (HOSPITAL_COMMUNITY): Payer: Self-pay | Admitting: Anesthesiology

## 2014-10-15 DIAGNOSIS — S2232XA Fracture of one rib, left side, initial encounter for closed fracture: Secondary | ICD-10-CM | POA: Diagnosis not present

## 2014-10-15 DIAGNOSIS — S2242XA Multiple fractures of ribs, left side, initial encounter for closed fracture: Principal | ICD-10-CM | POA: Diagnosis present

## 2014-10-15 DIAGNOSIS — Z4682 Encounter for fitting and adjustment of non-vascular catheter: Secondary | ICD-10-CM | POA: Diagnosis not present

## 2014-10-15 DIAGNOSIS — J9383 Other pneumothorax: Secondary | ICD-10-CM | POA: Diagnosis not present

## 2014-10-15 DIAGNOSIS — S3993XA Unspecified injury of pelvis, initial encounter: Secondary | ICD-10-CM | POA: Diagnosis not present

## 2014-10-15 DIAGNOSIS — Z8249 Family history of ischemic heart disease and other diseases of the circulatory system: Secondary | ICD-10-CM | POA: Diagnosis not present

## 2014-10-15 DIAGNOSIS — G4730 Sleep apnea, unspecified: Secondary | ICD-10-CM | POA: Diagnosis not present

## 2014-10-15 DIAGNOSIS — F0390 Unspecified dementia without behavioral disturbance: Secondary | ICD-10-CM | POA: Diagnosis present

## 2014-10-15 DIAGNOSIS — J942 Hemothorax: Secondary | ICD-10-CM

## 2014-10-15 DIAGNOSIS — G20 Parkinson's disease: Secondary | ICD-10-CM | POA: Diagnosis present

## 2014-10-15 DIAGNOSIS — J939 Pneumothorax, unspecified: Secondary | ICD-10-CM

## 2014-10-15 DIAGNOSIS — S2249XA Multiple fractures of ribs, unspecified side, initial encounter for closed fracture: Secondary | ICD-10-CM

## 2014-10-15 DIAGNOSIS — R0602 Shortness of breath: Secondary | ICD-10-CM | POA: Diagnosis not present

## 2014-10-15 DIAGNOSIS — E43 Unspecified severe protein-calorie malnutrition: Secondary | ICD-10-CM | POA: Diagnosis present

## 2014-10-15 DIAGNOSIS — S272XXA Traumatic hemopneumothorax, initial encounter: Secondary | ICD-10-CM | POA: Diagnosis not present

## 2014-10-15 DIAGNOSIS — J439 Emphysema, unspecified: Secondary | ICD-10-CM | POA: Diagnosis not present

## 2014-10-15 DIAGNOSIS — I10 Essential (primary) hypertension: Secondary | ICD-10-CM | POA: Diagnosis not present

## 2014-10-15 DIAGNOSIS — S0990XA Unspecified injury of head, initial encounter: Secondary | ICD-10-CM | POA: Diagnosis not present

## 2014-10-15 DIAGNOSIS — S270XXA Traumatic pneumothorax, initial encounter: Secondary | ICD-10-CM | POA: Diagnosis present

## 2014-10-15 DIAGNOSIS — W19XXXA Unspecified fall, initial encounter: Secondary | ICD-10-CM | POA: Diagnosis present

## 2014-10-15 DIAGNOSIS — S3991XA Unspecified injury of abdomen, initial encounter: Secondary | ICD-10-CM | POA: Diagnosis not present

## 2014-10-15 DIAGNOSIS — J90 Pleural effusion, not elsewhere classified: Secondary | ICD-10-CM | POA: Diagnosis not present

## 2014-10-15 DIAGNOSIS — Z9181 History of falling: Secondary | ICD-10-CM | POA: Diagnosis not present

## 2014-10-15 DIAGNOSIS — S2239XA Fracture of one rib, unspecified side, initial encounter for closed fracture: Secondary | ICD-10-CM

## 2014-10-15 DIAGNOSIS — S12100A Unspecified displaced fracture of second cervical vertebra, initial encounter for closed fracture: Secondary | ICD-10-CM | POA: Diagnosis not present

## 2014-10-15 DIAGNOSIS — J9811 Atelectasis: Secondary | ICD-10-CM | POA: Diagnosis not present

## 2014-10-15 HISTORY — DX: Unspecified dementia, unspecified severity, without behavioral disturbance, psychotic disturbance, mood disturbance, and anxiety: F03.90

## 2014-10-15 LAB — MRSA PCR SCREENING: MRSA BY PCR: NEGATIVE

## 2014-10-15 LAB — COMPREHENSIVE METABOLIC PANEL
ALT: 16 U/L (ref 0–35)
ANION GAP: 14 (ref 5–15)
AST: 42 U/L — ABNORMAL HIGH (ref 0–37)
Albumin: 4.2 g/dL (ref 3.5–5.2)
Alkaline Phosphatase: 55 U/L (ref 39–117)
BILIRUBIN TOTAL: 1.1 mg/dL (ref 0.3–1.2)
BUN: 27 mg/dL — AB (ref 6–23)
CO2: 23 mmol/L (ref 19–32)
Calcium: 10.4 mg/dL (ref 8.4–10.5)
Chloride: 101 mmol/L (ref 96–112)
Creatinine, Ser: 1.13 mg/dL — ABNORMAL HIGH (ref 0.50–1.10)
GFR, EST AFRICAN AMERICAN: 50 mL/min — AB (ref >90)
GFR, EST NON AFRICAN AMERICAN: 43 mL/min — AB (ref >90)
Glucose, Bld: 97 mg/dL (ref 70–99)
Potassium: 3.6 mmol/L (ref 3.5–5.1)
Sodium: 138 mmol/L (ref 135–145)
TOTAL PROTEIN: 7.1 g/dL (ref 6.0–8.3)

## 2014-10-15 LAB — PROTIME-INR
INR: 1.03 (ref 0.00–1.49)
Prothrombin Time: 13.6 seconds (ref 11.6–15.2)

## 2014-10-15 LAB — SAMPLE TO BLOOD BANK

## 2014-10-15 LAB — CBC
HCT: 38.6 % (ref 36.0–46.0)
Hemoglobin: 13.1 g/dL (ref 12.0–15.0)
MCH: 30.8 pg (ref 26.0–34.0)
MCHC: 33.9 g/dL (ref 30.0–36.0)
MCV: 90.8 fL (ref 78.0–100.0)
Platelets: 220 10*3/uL (ref 150–400)
RBC: 4.25 MIL/uL (ref 3.87–5.11)
RDW: 13 % (ref 11.5–15.5)
WBC: 11.1 10*3/uL — ABNORMAL HIGH (ref 4.0–10.5)

## 2014-10-15 LAB — ETHANOL: Alcohol, Ethyl (B): <5 mg/dL (ref 0–9)

## 2014-10-15 MED ORDER — POTASSIUM CHLORIDE IN NACL 20-0.45 MEQ/L-% IV SOLN
INTRAVENOUS | Status: DC
  Administered 2014-10-15 – 2014-10-19 (×2): via INTRAVENOUS
  Filled 2014-10-15 (×3): qty 1000

## 2014-10-15 MED ORDER — ATENOLOL 25 MG PO TABS
50.0000 mg | ORAL_TABLET | Freq: Every day | ORAL | Status: DC
  Administered 2014-10-16 – 2014-10-22 (×7): 50 mg via ORAL
  Filled 2014-10-15 (×6): qty 2
  Filled 2014-10-15: qty 1

## 2014-10-15 MED ORDER — CHLORTHALIDONE 25 MG PO TABS
25.0000 mg | ORAL_TABLET | Freq: Every day | ORAL | Status: DC
  Administered 2014-10-16 – 2014-10-22 (×7): 25 mg via ORAL
  Filled 2014-10-15 (×7): qty 1

## 2014-10-15 MED ORDER — IOHEXOL 300 MG/ML  SOLN
80.0000 mL | Freq: Once | INTRAMUSCULAR | Status: AC | PRN
  Administered 2014-10-15: 80 mL via INTRAVENOUS

## 2014-10-15 MED ORDER — TRAMADOL HCL 50 MG PO TABS
50.0000 mg | ORAL_TABLET | Freq: Four times a day (QID) | ORAL | Status: DC | PRN
  Administered 2014-10-15 – 2014-10-16 (×2): 100 mg via ORAL
  Filled 2014-10-15 (×2): qty 2

## 2014-10-15 MED ORDER — POTASSIUM CHLORIDE CRYS ER 20 MEQ PO TBCR
20.0000 meq | EXTENDED_RELEASE_TABLET | Freq: Every day | ORAL | Status: DC
  Administered 2014-10-16 – 2014-10-22 (×7): 20 meq via ORAL
  Filled 2014-10-15 (×7): qty 1

## 2014-10-15 MED ORDER — CARBIDOPA-LEVODOPA 25-100 MG PO TABS
1.0000 | ORAL_TABLET | Freq: Two times a day (BID) | ORAL | Status: DC
  Administered 2014-10-15 – 2014-10-22 (×13): 1 via ORAL
  Filled 2014-10-15 (×15): qty 1

## 2014-10-15 MED ORDER — LINACLOTIDE 145 MCG PO CAPS
145.0000 ug | ORAL_CAPSULE | Freq: Every day | ORAL | Status: DC
  Administered 2014-10-15 – 2014-10-22 (×8): 145 ug via ORAL
  Filled 2014-10-15 (×9): qty 1

## 2014-10-15 MED ORDER — ATENOLOL-CHLORTHALIDONE 50-25 MG PO TABS: 1.0000 | ORAL_TABLET | Freq: Every day | ORAL | Status: DC

## 2014-10-15 MED ORDER — FESOTERODINE FUMARATE ER 8 MG PO TB24
8.0000 mg | ORAL_TABLET | Freq: Every day | ORAL | Status: DC
  Administered 2014-10-16 – 2014-10-22 (×7): 8 mg via ORAL
  Filled 2014-10-15 (×7): qty 1

## 2014-10-15 MED ORDER — MORPHINE SULFATE 2 MG/ML IJ SOLN: 2.0000 mg | INTRAMUSCULAR | Status: DC | PRN

## 2014-10-15 MED ORDER — MEMANTINE HCL ER 14 MG PO CP24
14.0000 mg | ORAL_CAPSULE | Freq: Every day | ORAL | Status: DC
  Administered 2014-10-16 – 2014-10-22 (×7): 14 mg via ORAL
  Filled 2014-10-15 (×7): qty 1

## 2014-10-15 MED ORDER — DONEPEZIL HCL 5 MG PO TABS
5.0000 mg | ORAL_TABLET | Freq: Every day | ORAL | Status: DC
  Administered 2014-10-15 – 2014-10-21 (×6): 5 mg via ORAL
  Filled 2014-10-15 (×8): qty 1

## 2014-10-15 MED ORDER — NAPROXEN 250 MG PO TABS
500.0000 mg | ORAL_TABLET | Freq: Two times a day (BID) | ORAL | Status: DC
  Administered 2014-10-16 – 2014-10-22 (×12): 500 mg via ORAL
  Filled 2014-10-15: qty 2
  Filled 2014-10-15: qty 1
  Filled 2014-10-15 (×8): qty 2
  Filled 2014-10-15: qty 1
  Filled 2014-10-15 (×2): qty 2
  Filled 2014-10-15: qty 1
  Filled 2014-10-15: qty 2

## 2014-10-15 MED ORDER — FENTANYL CITRATE 0.05 MG/ML IJ SOLN
25.0000 ug | Freq: Once | INTRAMUSCULAR | Status: AC
  Administered 2014-10-15: 25 ug via INTRAVENOUS
  Filled 2014-10-15: qty 2

## 2014-10-15 MED ORDER — PRAVASTATIN SODIUM 40 MG PO TABS
80.0000 mg | ORAL_TABLET | Freq: Every day | ORAL | Status: DC
  Administered 2014-10-15 – 2014-10-21 (×6): 80 mg via ORAL
  Filled 2014-10-15: qty 2
  Filled 2014-10-15: qty 1
  Filled 2014-10-15: qty 2
  Filled 2014-10-15: qty 4
  Filled 2014-10-15 (×2): qty 2
  Filled 2014-10-15: qty 1
  Filled 2014-10-15: qty 2

## 2014-10-15 NOTE — Consult Note (Signed)
Anesthesiology Note:  81 year old female with parkinsonism and advanced dementia fell at home today in the bathroom. As noted she has a history of multiple falls. Chest CT shows L. 5th-10th rib fractures with a L. anterior pneumothorax. Head C-spine, and abdominal CTs show no evidence of acute injury.  S: T- 36.7 BP-163/80 HR- 96 RR-18 O2 Sat 97-100% on RA  Pt. appears agitated and confused. She is disoriented to place.  Heart - RRR Lungs- decreased BS bilaterally    I discussed pain management options with [name]'s husband and son. Presently she is confused and unlikely to cooperate with a thoracic epidural placement. She does not appear to be in excessive pain at present. We will follow her and if she becomes more lucid and cooperative, we may consider inserting a thoracic epidural at that time. Both her husband and son are in agreement with this plan.  Kathleen Haynes

## 2014-10-15 NOTE — ED Notes (Signed)
Trauma at bedside.

## 2014-10-15 NOTE — ED Notes (Signed)
Pt presents with L sided rib pain after falling in bathroom PTA.  Pt reports she was getting up off toilet, falling into tub.  Husband reports pt will not use walker and has fallen twice this week.

## 2014-10-15 NOTE — H&P (Signed)
Kathleen Haynes is an 81 y.o. female.   Chief Complaint: Fall HPI: Kathleen Haynes was as home and fell in the bathroom. Family denies syncope but says she's had multiple recent falls 2/2 weakness. They attribute this to anorexia. She has a dx of Parkinson's but refuses to use a walker at home. She c/o pain in her left chest.  Past Medical History  Diagnosis Date  . Parkinson disease   . High cholesterol     since 2000  . Hypertension     since 1985  . Back pain     lower back pain , C2 fracture from fall  . Obstructive apnea   . Dementia     Past Surgical History  Procedure Laterality Date  . Shoulder surgery Left     [id number]  . Lumbar spine surgery    . Cervical spine surgery      02/2011, 03/2011  . Rotary cuff Right     Family History  Problem Relation Age of Onset  . Cancer Mother   . Congestive Heart Failure Father   . Diabetes Other   . Hypertension Other   . Hyperlipidemia Other   . Heart disease Other    Social History:  reports that she has never smoked. She has never used smokeless tobacco. She reports that she does not drink alcohol or use illicit drugs.  Allergies: No Known Allergies  No results found for this or any previous visit (from the past 2 hour(s)). Dg Chest 2 View  10/15/2014   CLINICAL DATA:  Fall from toilet with left-sided rib pain, initial encounter  EXAM: CHEST  2 VIEW  COMPARISON:  03/31/2011  FINDINGS: Cardiac shadow is within normal limits. The lungs are well aerated bilaterally. Multiple rib fractures are noted on the left to include the fifth sixth seventh and eighth ribs. There is a relative paucity of lung markings in the apex suggesting a small pneumothorax although a definitive pleura line is not well visualized. No compression deformities are seen. Postsurgical changes are noted in the lumbar spine. Mild left basilar atelectasis is seen.  IMPRESSION: Multiple left-sided rib fractures as described. There changes suggestive of a pneumothorax  although no definitive pleural line is seen. Expiratory view may be helpful as clinically indicated depending on the patient's current status.   Electronically Signed   By: Inez Catalina M.D.   On: 10/15/2014 14:55    Review of Systems  Unable to perform ROS: mental acuity  Cardiovascular: Positive for chest pain.    Blood pressure 124/76, pulse 96, temperature 98.2 F (36.8 C), temperature source Oral, resp. rate 21, SpO2 99 %. Physical Exam  Vitals reviewed. Constitutional: She appears well-developed and well-nourished. She is cooperative. No distress. Cervical collar and nasal cannula in place.  HENT:  Head: Normocephalic and atraumatic. Head is without raccoon's eyes, without Battle's sign, without abrasion, without contusion and without laceration.  Right Ear: Hearing, tympanic membrane, external ear and ear canal normal. No lacerations. No drainage or tenderness. No foreign bodies. Tympanic membrane is not perforated. No hemotympanum.  Left Ear: Hearing, tympanic membrane, external ear and ear canal normal. No lacerations. No drainage or tenderness. No foreign bodies. Tympanic membrane is not perforated. No hemotympanum.  Nose: Nose normal. No nose lacerations, sinus tenderness, nasal deformity or nasal septal hematoma. No epistaxis.  Mouth/Throat: Uvula is midline, oropharynx is clear and moist and mucous membranes are normal. No lacerations.  Eyes: Conjunctivae, EOM and lids are normal. Pupils are equal, round, and  reactive to light. Right eye exhibits no discharge. Left eye exhibits no discharge. No scleral icterus.  Neck: Trachea normal. Neck supple. No JVD present. No spinous process tenderness and no muscular tenderness present. Carotid bruit is not present. No tracheal deviation present. No thyromegaly present.  Cardiovascular: Normal rate, regular rhythm, normal heart sounds, intact distal pulses and normal pulses.  Exam reveals no gallop and no friction rub.   No murmur  heard. Respiratory: Effort normal and breath sounds normal. No stridor. No respiratory distress. She has no wheezes. She has no rales. She exhibits tenderness (Left). She exhibits no bony tenderness, no laceration and no crepitus.  GI: Soft. Normal appearance and bowel sounds are normal. She exhibits no distension. There is no tenderness. There is no rigidity, no rebound, no guarding and no CVA tenderness.  Musculoskeletal: Normal range of motion. She exhibits no edema or tenderness.  Lymphadenopathy:    She has no cervical adenopathy.  Neurological: She is alert. She has normal strength. No cranial nerve deficit or sensory deficit. GCS eye subscore is 4. GCS verbal subscore is 5. GCS motor subscore is 6.  Skin: Skin is warm, dry and intact. She is not diaphoretic.  Psychiatric: She has a normal mood and affect. Her speech is normal and behavior is normal.     Assessment/Plan Fall Multiple left rib fxs -- I have consulted anesthesia regarding epidural placement. Will try to maximize pain control and pulmonary toilet. CT chest pending to r/o PTX though I suspect she may have an anterior one. Oxygen levels are good though. Multiple medical problems -- Home meds  I asked about code status but son in room directed me to husband who had to return home to feed some animals. Will discuss with him and wife when he returns.    Lisette Abu, PA-C Pager: [phone] General Trauma PA Pager: [phone] 10/15/2014, 4:12 PM

## 2014-10-15 NOTE — ED Provider Notes (Signed)
CSN: [id number]     Arrival date & time 10/15/14  1307 History   First MD Initiated Contact with Patient 10/15/14 1457     No chief complaint on file.    (Consider location/radiation/quality/duration/timing/severity/associated sxs/prior Treatment) HPI Comments: 81 yo female with history  Of Parkinson's disease and dementia presenting after a fall. Her husband states that she was in the bathroom, not using her walker, when she fell striking the left side of her chest on the bathtub. No head trauma or loss of consciousness. Patient has had moderate to severe pain in the left side of her chest which hurts worse when breathing since the accident.  Patient is a 81 y.o. female presenting with fall.  Fall This is a recurrent problem. Episode onset: a few hours ago. Episode frequency: once. Associated symptoms include chest pain and shortness of breath. Pertinent negatives include no abdominal pain. Exacerbated by: breathing, moving, twisting. Nothing relieves the symptoms. She has tried nothing for the symptoms.    Past Medical History  Diagnosis Date  . Parkinson disease   . High cholesterol     since 2000  . Hypertension     since 1985  . Back pain     lower back pain , C2 fracture from fall  . Obstructive apnea   . Dementia    Past Surgical History  Procedure Laterality Date  . Shoulder surgery Left     0737,1062  . Lumbar spine surgery    . Cervical spine surgery      02/2011, 03/2011  . Rotary cuff Right    Family History  Problem Relation Age of Onset  . Cancer Mother   . Congestive Heart Failure Father   . Diabetes Other   . Hypertension Other   . Hyperlipidemia Other   . Heart disease Other    History  Substance Use Topics  . Smoking status: Never Smoker   . Smokeless tobacco: Never Used  . Alcohol Use: No   OB History    No data available     Review of Systems  Respiratory: Positive for shortness of breath.   Cardiovascular: Positive for chest pain.    Gastrointestinal: Negative for abdominal pain.  All other systems reviewed and are negative.     Allergies  Review of patient's allergies indicates no known allergies.  Home Medications   Prior to Admission medications   Medication Sig Start Date End Date Taking? Authorizing Provider  atenolol-chlorthalidone (TENORETIC) 50-25 MG per tablet Take 1 tablet by mouth daily.    Historical Provider, MD  carbidopa-levodopa (SINEMET IR) 25-100 MG per tablet Take 1 tablet by mouth 2 (two) times daily. 09/15/14   Star Age, MD  donepezil (ARICEPT) 5 MG tablet Take 1 tablet (5 mg total) by mouth at bedtime. 09/15/14   Star Age, MD  Linaclotide (LINZESS) 145 MCG CAPS capsule Take 1 capsule (145 mcg total) by mouth daily. 09/15/14   Star Age, MD  memantine (NAMENDA XR) 14 MG CP24 24 hr capsule Take 1 capsule (14 mg total) by mouth daily. 09/15/14   Star Age, MD  potassium chloride SA (K-DUR,KLOR-CON) 20 MEQ tablet Take 20 mEq by mouth daily.    Historical Provider, MD  pravastatin (PRAVACHOL) 80 MG tablet Take 80 mg by mouth daily.    Historical Provider, MD  tolterodine (DETROL LA) 4 MG 24 hr capsule Take 4 mg by mouth daily.    Historical Provider, MD  traMADol Veatrice Bourbon) 50 MG tablet  09/02/14  Historical Provider, MD   BP 134/80 mmHg  Pulse 88  Temp(Src) 98.2 F (36.8 C) (Oral)  Resp 18  SpO2 99% Physical Exam  Constitutional: She is oriented to person, place, and time. She appears well-developed and well-nourished. No distress.  HENT:  Head: Normocephalic and atraumatic. Head is without raccoon's eyes and without Battle's sign.  Nose: Nose normal.  Eyes: Conjunctivae and EOM are normal. Pupils are equal, round, and reactive to light. No scleral icterus.  Neck: No spinous process tenderness and no muscular tenderness present.  Cardiovascular: Normal rate, regular rhythm, normal heart sounds and intact distal pulses.   No murmur heard. Pulmonary/Chest: Effort normal and breath sounds  normal. She has no rales. She exhibits no tenderness.    Abdominal: Soft. There is tenderness ( mild left-sided). There is no rigidity, no rebound and no guarding.  Musculoskeletal: Normal range of motion. She exhibits no edema or tenderness.       Thoracic back: She exhibits no tenderness and no bony tenderness.       Lumbar back: She exhibits no tenderness and no bony tenderness.  No evidence of trauma to extremities, except as noted.  2+ distal pulses.    Neurological: She is alert and oriented to person, place, and time.  Skin: Skin is warm and dry. No rash noted.  Psychiatric: She has a normal mood and affect.  Nursing note and vitals reviewed.   ED Course  Procedures (including critical care time) Labs Review Labs Reviewed  CDS SEROLOGY  COMPREHENSIVE METABOLIC PANEL  CBC  ETHANOL  PROTIME-INR  SAMPLE TO BLOOD BANK    Imaging Review Dg Chest 2 View  10/15/2014   CLINICAL DATA:  Fall from toilet with left-sided rib pain, initial encounter  EXAM: CHEST  2 VIEW  COMPARISON:  03/31/2011  FINDINGS: Cardiac shadow is within normal limits. The lungs are well aerated bilaterally. Multiple rib fractures are noted on the left to include the fifth sixth seventh and eighth ribs. There is a relative paucity of lung markings in the apex suggesting a small pneumothorax although a definitive pleura line is not well visualized. No compression deformities are seen. Postsurgical changes are noted in the lumbar spine. Mild left basilar atelectasis is seen.  IMPRESSION: Multiple left-sided rib fractures as described. There changes suggestive of a pneumothorax although no definitive pleural line is seen. Expiratory view may be helpful as clinically indicated depending on the patient's current status.   Electronically Signed   By: Inez Catalina M.D.   On: 10/15/2014 14:55  All radiology studies independently viewed by me.      EKG Interpretation None      MDM   Final diagnoses:  Traumatic  hemopneumothorax  Rib fractures  Hemopneumothorax, left  Pneumothorax, left     81 year old female with history of  Parkinson's disease and dementia who fell , striking the left side of her chest on the bathtub. She has multiple rib fractures on the left and some plain film evidence of pneumothorax.   She also seems to have severe pain with any change in position and with deep breaths. Plan CT imaging, trauma consult. IV fentanyl for pain control. Admit.    Houston Siren III, MD 10/21/14 [phone]

## 2014-10-16 ENCOUNTER — Inpatient Hospital Stay (HOSPITAL_COMMUNITY): Payer: Medicare Other

## 2014-10-16 LAB — BASIC METABOLIC PANEL
Anion gap: 10 (ref 5–15)
BUN: 29 mg/dL — AB (ref 6–23)
CHLORIDE: 100 mmol/L (ref 96–112)
CO2: 27 mmol/L (ref 19–32)
Calcium: 10.1 mg/dL (ref 8.4–10.5)
Creatinine, Ser: 1.12 mg/dL — ABNORMAL HIGH (ref 0.50–1.10)
GFR calc non Af Amer: 44 mL/min — ABNORMAL LOW (ref >90)
GFR, EST AFRICAN AMERICAN: 50 mL/min — AB (ref >90)
GLUCOSE: 125 mg/dL — AB (ref 70–99)
POTASSIUM: 3.5 mmol/L (ref 3.5–5.1)
Sodium: 137 mmol/L (ref 135–145)

## 2014-10-16 LAB — CBC
HCT: 37.1 % (ref 36.0–46.0)
Hemoglobin: 12.6 g/dL (ref 12.0–15.0)
MCH: 30.9 pg (ref 26.0–34.0)
MCHC: 34 g/dL (ref 30.0–36.0)
MCV: 90.9 fL (ref 78.0–100.0)
PLATELETS: 249 10*3/uL (ref 150–400)
RBC: 4.08 MIL/uL (ref 3.87–5.11)
RDW: 13 % (ref 11.5–15.5)
WBC: 9.4 10*3/uL (ref 4.0–10.5)

## 2014-10-16 MED ORDER — TRAMADOL HCL 50 MG PO TABS
50.0000 mg | ORAL_TABLET | Freq: Four times a day (QID) | ORAL | Status: DC | PRN
  Administered 2014-10-16: 100 mg via ORAL
  Administered 2014-10-17 – 2014-10-22 (×7): 50 mg via ORAL
  Filled 2014-10-16 (×10): qty 1

## 2014-10-16 MED ORDER — ENSURE COMPLETE PO LIQD
237.0000 mL | Freq: Two times a day (BID) | ORAL | Status: DC
  Administered 2014-10-16 – 2014-10-20 (×7): 237 mL via ORAL

## 2014-10-16 NOTE — Progress Notes (Signed)
CRITICAL VALUE ALERT  Critical value received:  Radiologist called with critical xray results showing pneumothorax of 10-15% on left  Date of notification:  10/16/14  Time of notification:  0734  Critical value read back:Yes.    Nurse who received alert:  Harlan Stains   MD notified (1st page):    Time of first page:    MD notified (2nd page):  Time of second page:  Responding MD:  Dr. Hulen Skains at nursing station; made him aware of critical results.   Time MD responded:

## 2014-10-16 NOTE — Progress Notes (Signed)
Trauma Service Note  Subjective: Patient is very confused and disoriented.  I partly cannot understand what she is saying also.  Objective: Vital signs in last 24 hours: Temp:  [97.9 F (36.6 C)-98.2 F (36.8 C)] 97.9 F (36.6 C) (02/19 0757) Pulse Rate:  [54-100] 98 (02/19 0500) Resp:  [17-24] 20 (02/19 0500) BP: (57-163)/(45-80) 158/71 mmHg (02/19 0500) SpO2:  [94 %-100 %] 96 % (02/19 0500) Weight:  [44 kg (97 lb)] 44 kg (97 lb) (02/19 0700)    Intake/Output from previous day: 02/18 0701 - 02/19 0700 In: 1030 [P.O.:480; I.V.:550] Out: -  Intake/Output this shift:    General: No distress  Lungs: Clear.  CXR is not nearly as bad as the radiologist reading.  Minimal left apical PTX.  No chest tube needed.  Abd: Benign  Extremities: No changes  Neuro: Confused and disoriented.  Lab Results: CBC   Recent Labs  10/15/14 1601 10/16/14 0241  WBC 11.1* 9.4  HGB 13.1 12.6  HCT 38.6 37.1  PLT 220 249   BMET  Recent Labs  10/15/14 1601 10/16/14 0241  NA 138 137  K 3.6 3.5  CL 101 100  CO2 23 27  GLUCOSE 97 125*  BUN 27* 29*  CREATININE 1.13* 1.12*  CALCIUM 10.4 10.1   PT/INR  Recent Labs  10/15/14 1601  LABPROT 13.6  INR 1.03   ABG No results for input(s): PHART, HCO3 in the last 72 hours.  Invalid input(s): PCO2, PO2  Studies/Results: Dg Chest 2 View  10/15/2014   CLINICAL DATA:  Fall from toilet with left-sided rib pain, initial encounter  EXAM: CHEST  2 VIEW  COMPARISON:  03/31/2011  FINDINGS: Cardiac shadow is within normal limits. The lungs are well aerated bilaterally. Multiple rib fractures are noted on the left to include the fifth sixth seventh and eighth ribs. There is a relative paucity of lung markings in the apex suggesting a small pneumothorax although a definitive pleura line is not well visualized. No compression deformities are seen. Postsurgical changes are noted in the lumbar spine. Mild left basilar atelectasis is seen.   IMPRESSION: Multiple left-sided rib fractures as described. There changes suggestive of a pneumothorax although no definitive pleural line is seen. Expiratory view may be helpful as clinically indicated depending on the patient's current status.   Electronically Signed   By: Inez Catalina M.D.   On: 10/15/2014 14:55   Ct Head Wo Contrast  10/15/2014   CLINICAL DATA:  81 year old female with history of trauma from a fall while in the bathroom.  EXAM: CT HEAD WITHOUT CONTRAST  CT CERVICAL SPINE WITHOUT CONTRAST  TECHNIQUE: Multidetector CT imaging of the head and cervical spine was performed following the standard protocol without intravenous contrast. Multiplanar CT image reconstructions of the cervical spine were also generated.  COMPARISON:  Cervical spine CT 02/13/2011.  Head CT 01/26/2011.  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 Mild cerebral atrophy. Patchy and confluent areas of decreased attenuation are noted throughout the deep and periventricular white matter of the cerebral hemispheres bilaterally, compatible with chronic microvascular ischemic disease. No acute intracranial abnormalities. Specifically, no evidence of acute intracranial hemorrhage, no definite findings of acute/subacute cerebral ischemia, no mass, mass effect, hydrocephalus or abnormal intra or extra-axial fluid collections. Visualized paranasal sinuses and mastoids are well pneumatized. No acute displaced skull fractures are identified.  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 No malalignment at C1-C2 noted on today's examination, with probable pseudarthrosis at the site of the type 2 odontoid fracture. However, the incomplete arch of C1 noted on the prior study has widened posterior to both  lateral masses, with increasing separation likely secondary to chronic altered biomechanics. There is partial ankylosis between the lateral masses of C1 and C2 on the left side. No new acute displaced fractures in the cervical spine. Alignment is anatomic. Prevertebral soft tissues are normal. Multilevel degenerative disc disease is evidenced, most severe at C4-C5 and C5-C6. Multilevel facet arthropathy.  IMPRESSION: 1. No signs of significant acute traumatic injury to the skull, brain or cervical spine. 2. Chronic type 2 odontoid fracture redemonstrated with progressive degenerative changes at C1 and C2, as described above. In addition, there is multilevel degenerative disc disease and cervical spondylosis throughout other portions of the cervical spine as well. 3. 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   Electronically Signed   By: Vinnie Langton M.D.   On: 10/15/2014 18:45   Ct Cervical Spine Wo Contrast  10/15/2014   CLINICAL DATA:  81 year old female with history of trauma from a fall while in the bathroom.  EXAM: CT HEAD WITHOUT CONTRAST  CT CERVICAL SPINE WITHOUT CONTRAST  TECHNIQUE: Multidetector CT imaging of the head and cervical spine was performed following the standard protocol without intravenous contrast. Multiplanar CT image reconstructions of the cervical spine were also generated.  COMPARISON:  Cervical spine CT 02/13/2011.  Head CT 01/26/2011.  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 Mild cerebral atrophy. Patchy and confluent areas of decreased attenuation are noted throughout the deep and periventricular white matter of the cerebral hemispheres bilaterally, compatible with chronic  microvascular ischemic disease. No acute intracranial abnormalities. Specifically, no evidence of acute intracranial hemorrhage, no definite findings of acute/subacute cerebral ischemia, no mass, mass effect, hydrocephalus or abnormal intra or extra-axial fluid collections. Visualized paranasal sinuses and mastoids are well pneumatized. No acute displaced skull fractures are identified.  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 No malalignment at C1-C2 noted on today's examination, with probable pseudarthrosis at the site of the type 2 odontoid fracture. However, the incomplete arch of C1 noted on the prior study has widened posterior to both lateral masses, with increasing separation likely secondary to chronic altered biomechanics. There is partial ankylosis between the lateral masses of C1 and C2 on the left side. No new acute displaced fractures in the cervical spine. Alignment is anatomic. Prevertebral soft tissues are normal. Multilevel degenerative disc disease is evidenced, most severe at C4-C5 and C5-C6. Multilevel facet arthropathy.  IMPRESSION: 1. No signs of significant acute traumatic injury to the skull, brain or cervical spine. 2. Chronic type 2 odontoid fracture redemonstrated with progressive degenerative changes at C1 and C2, as described above. In addition, there is multilevel degenerative disc disease and cervical spondylosis throughout other portions of the cervical spine as well. 3. 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  Electronically Signed   By: Vinnie Langton M.D.   On: 10/15/2014 18:45   Ct Abdomen Pelvis W Contrast  10/15/2014   CLINICAL DATA:  Fall, left chest pain  EXAM: CT ABDOMEN AND PELVIS WITH CONTRAST  TECHNIQUE: Multidetector CT  imaging of the abdomen and pelvis was performed using the standard protocol following bolus administration of intravenous contrast.  CONTRAST:  84mL OMNIPAQUE IOHEXOL 300 MG/ML  SOLN  COMPARISON:  Chest radiograph same date  FINDINGS: Lower chest: Multiple segmental rib fractures are identified involving at least the left posterior eighth, ninth, and tenth ribs, incompletely visualized. Moderate-sized left pneumothorax is identified. No mediastinal shift. Contusion or compressive atelectasis is identified at the left lung base as well as bullous change or potentially No meta sealed.  Hepatobiliary: Streak artifact from the patient's arms and support apparatus obscures detail. Too small to characterize dome of right hepatic lobe 0.4 cm hypodense lesion is identified image 8. Probable 5 mm calcification or flash filling hemangioma noted at the dome of the right hepatic lobe. Gallbladder unremarkable.  Pancreas: Partly fatty infiltrated, with fusiform prominence of the pancreatic duct at the level of the head and body, measuring up to 3 mm maximally. No pancreatic mass identified.  Spleen: Normal  Adrenals/Urinary Tract: Adrenal glands appear normal. Lobulated renal contour bilaterally, with numerous too small to characterize hypodense lesions, likely cysts. Dominant right mid renal cortical cyst measures 2.8 cm image 27. No hydroureteronephrosis.  Stomach/Bowel: Moderate stool burden. No bowel wall thickening or focal segmental dilatation is identified.  Vascular/Lymphatic: Moderate atheromatous aortic calcification without aneurysm. No lymphadenopathy.  Other: Uterus and ovaries appear normal.  No free air or fluid.  Musculoskeletal: Lumbar fusion hardware spanning L3-S1 is noted. No evidence for hardware failure.  IMPRESSION: Multiple left-sided segmental rib fractures with moderate-sized left pneumothorax.  No acute intra-abdominal or pelvic pathology.  Critical Value/emergent results were called by telephone at  the time of interpretation on 10/15/2014 at 6:41 pm to Tammy RN who verbally acknowledged these results.   Electronically Signed   By: Conchita Paris M.D.   On: 10/15/2014 18:44   Dg Chest Port 1 View  10/16/2014   CLINICAL DATA:  Traumatic hemo pneumothorax, multiple left rib fractures  EXAM: PORTABLE CHEST - 1 VIEW  COMPARISON:  PA and lateral chest x-ray of October 15, 2014  FINDINGS: A small left-sided pneumothorax is visible today. A trace of pleural space fluid on the left is present. Multiple posterior lateral left rib fractures are again demonstrated. There is no mediastinal shift. The right lung is clear. The heart and mediastinal structures are unremarkable.  IMPRESSION: There has been interval development of a small left pneumothorax which was suspected yesterday without a definite pleural line being visible then. The pleural line is now visible. The volume of the pneumothorax is approximately 10-15%. No significant hemothorax is demonstrated.  Critical Value/emergent results were called by telephone at the time of interpretation on 10/16/2014 at 7:34 am to Melody Right, R.N., who verbally acknowledged these results.   Electronically Signed   By: David  Martinique   On: 10/16/2014 07:36    Anti-infectives: Anti-infectives    None      Assessment/Plan: s/p  Advance diet Needs all therapies.  Probably needs placement. Recheck CXR tomorrow Transfer out of ICU  LOS: 1 day   Kathryne Eriksson. Dahlia Bailiff, MD, FACS [phone] Trauma Surgeon 10/16/2014

## 2014-10-16 NOTE — Progress Notes (Signed)
UR completed.  PT/OT evals will be done and recommendations will be presented to the family for their decisions on level of care at d/c.   Sandi Mariscal, RN BSN [place] CCM Trauma/Neuro ICU Case Manager [phone]

## 2014-10-16 NOTE — Progress Notes (Signed)
Patient has soiled self. Refuses sitter, NT, and myself to change her. Patient is combative. Paged Trauma PA and awaiting response. Will continue to monitor accordingly.

## 2014-10-16 NOTE — Progress Notes (Signed)
Anesthesiology Follow-up:  Mrs. Lutes remains confused. Still disoriented to place. Has received only naproxen for today. She does not appear to be an appropriate candidate for an epidural.  Roberts Gaudy

## 2014-10-16 NOTE — Progress Notes (Signed)
Transferred pt to 4n32 at this time. No s/s of any acute distress.  Receiving RN at bedside.

## 2014-10-16 NOTE — Evaluation (Signed)
Physical Therapy Evaluation Patient Details Name: Kathleen Haynes MRN: [id number] DOB: 03-09-1927 Today's Date: 10/16/2014   History of Present Illness  pt presents after multiple falls resulting in L 5-10 rib fxs.  pt with hx of Dementia and Parkinson's.    Clinical Impression  Pt confused and with Dementia at baseline.  Pt does follow simple directions and participates with mobility, but requires 2 person A for safety.  Pt lives with her husband, but feel that pt will need SNF at D/C to maximize independence prior to returning to home with husband.  Will continue to follow.      Follow Up Recommendations SNF    Equipment Recommendations  None recommended by PT    Recommendations for Other Services       Precautions / Restrictions Precautions Precautions: Fall Restrictions Weight Bearing Restrictions: No      Mobility  Bed Mobility Overal bed mobility: Needs Assistance;+2 for physical assistance Bed Mobility: Supine to Sit;Sit to Supine     Supine to sit: Mod assist;+2 for physical assistance Sit to supine: Mod assist;+2 for physical assistance   General bed mobility comments: pt does attempt to A with mobility, but grimaces and grabs at L side during mobility.    Transfers Overall transfer level: Needs assistance Equipment used: 2 person hand held assist Transfers: Sit to/from Stand Sit to Stand: Max assist;+2 physical assistance         General transfer comment: cues and facilitation for UE use, blocking Bil feet, andterior wt shifting over BOS.  pt with strong posterior lean.    Ambulation/Gait Ambulation/Gait assistance: Max assist;+2 physical assistance   Assistive device: 2 person hand held assist       General Gait Details: pt able to take side steps towards [facility] with facilitation for movement of Bil LEs and bringing weight anteriorly over BOS.    Stairs            Wheelchair Mobility    Modified Rankin (Stroke Patients Only)        Balance Overall balance assessment: Needs assistance Sitting-balance support: No upper extremity supported;Feet supported Sitting balance-Leahy Scale: Poor Sitting balance - Comments: pt able to sit for brushing teeth at EOB with MinA needed to prevent posterior LOB.   Postural control: Posterior lean Standing balance support: During functional activity Standing balance-Leahy Scale: Zero                               Pertinent Vitals/Pain Pain Assessment: Faces Faces Pain Scale: Hurts little more Pain Location: pt grabs L side during mobility.   Pain Descriptors / Indicators: Grimacing Pain Intervention(s): Monitored during session;Premedicated before session;Repositioned    Home Living Family/patient expects to be discharged to:: Skilled nursing facility                      Prior Function Level of Independence: Needs assistance   Gait / Transfers Assistance Needed: pt is supposed to use a RW, but per husband does not use often.    ADL's / Homemaking Assistance Needed: Husband performs all homemaking tasks and A with ADLs.          Hand Dominance        Extremity/Trunk Assessment   Upper Extremity Assessment: Defer to OT evaluation           Lower Extremity Assessment: Generalized weakness      Cervical / Trunk Assessment: Kyphotic  Communication   Communication: No difficulties  Cognition Arousal/Alertness: Awake/alert Behavior During Therapy: WFL for tasks assessed/performed Overall Cognitive Status: History of cognitive impairments - at baseline                      General Comments      Exercises        Assessment/Plan    PT Assessment Patient needs continued PT services  PT Diagnosis Difficulty walking;Generalized weakness   PT Problem List Decreased strength;Decreased activity tolerance;Decreased balance;Decreased mobility;Decreased coordination;Decreased cognition;Decreased knowledge of use of DME;Decreased  safety awareness;Pain  PT Treatment Interventions DME instruction;Gait training;Functional mobility training;Therapeutic activities;Therapeutic exercise;Balance training;Neuromuscular re-education;Cognitive remediation;Patient/family education   PT Goals (Current goals can be found in the Care Plan section) Acute Rehab PT Goals Patient Stated Goal: Per Husband for pt to get stronger.   PT Goal Formulation: With family Time For Goal Achievement: 10/30/14 Potential to Achieve Goals: Fair    Frequency Min 3X/week   Barriers to discharge        Co-evaluation PT/OT/SLP Co-Evaluation/Treatment: Yes Reason for Co-Treatment: Necessary to address cognition/behavior during functional activity;For patient/therapist safety PT goals addressed during session: Mobility/safety with mobility;Balance         End of Session Equipment Utilized During Treatment: Gait belt Activity Tolerance: Patient limited by fatigue Patient left: in bed;with call bell/phone within reach;with bed alarm set;with family/visitor present ([place] applied.) Nurse Communication: Mobility status         Time: 1000-1029 PT Time Calculation (min) (ACUTE ONLY): 29 min   Charges:   PT Evaluation $Initial PT Evaluation Tier I: 1 Procedure     PT G CodesCatarina Hartshorn, [place] 10/16/2014, 11:41 AM

## 2014-10-16 NOTE — Progress Notes (Signed)
INITIAL NUTRITION ASSESSMENT  DOCUMENTATION CODES Per approved criteria  -Severe malnutrition in the context of chronic illness   INTERVENTION: Ensure Complete po BID, each supplement provides 350 kcal and 13 grams of protein Magic cup TID with meals, each supplement provides 290 kcal and 9 grams of protein  NUTRITION DIAGNOSIS: Malnutrition related to chronic illness as evidenced by intake </= 75% of her needs in >/= 1 month and severe muscle depletion.   Goal: Pt to meet >/= 90% of their estimated nutrition needs   Monitor:  PO intake, supplement acceptance, weight trends  Reason for Assessment: Pt identified as at nutrition risk on the Malnutrition Screen Tool  81 y.o. female  ASSESSMENT: Pt with hx of parkinson's, advanced dementia, and multiple falls fell at home in the bathroom. Pt suffered left 5-10th rib fxs and left anterior pneumothorax.   Husband and son at bedside. They report that pt has ate poorly for years. She drinks an ensure in the morning with her pills. Husband tries to feed her 2 meals per day but she either refuses or only eats bites.  Unsure of family's understanding of dementia. Husband states that he told her not to get up without using her walker and feels that the pt has stopped eating out of spite, relayed to RN.   Nutrition Focused Physical Exam:  Subcutaneous Fat:  Orbital Region: WDL Upper Arm Region: WDL Thoracic and Lumbar Region: WDL  Muscle:  Temple Region: severe depletion Clavicle Bone Region: mild/moderate depletion Clavicle and Acromion Bone Region: severe depletion Scapular Bone Region: severe depletion Dorsal Hand: severe depletion Patellar Region: severe depletion Anterior Thigh Region: severe depletion Posterior Calf Region: severe depletion  Edema: not present   Height: Ht Readings from Last 1 Encounters:  10/15/14 5\' 2"  (1.575 m)    Weight: Wt Readings from Last 1 Encounters:  10/16/14 97 lb (44 kg)    Ideal Body  Weight: 50 kg   % Ideal Body Weight: 88%  Wt Readings from Last 10 Encounters:  10/16/14 97 lb (44 kg)  09/15/14 100 lb (45.36 kg)  06/16/14 94 lb 12.8 oz (43.001 kg)  04/18/13 107 lb (48.535 kg)  11/27/12 101 lb (45.813 kg)  10/19/12 97 lb 8 oz (44.226 kg)    Usual Body Weight: 98-105 lb   % Usual Body Weight: 99%  BMI:  Body mass index is 17.74 kg/(m^2).  Estimated Nutritional Needs: Kcal: 1300-1500 Protein: 65-75 grams Fluid: > 1.5 L/day  Skin: no issues noted  Diet Order: Diet regular  EDUCATION NEEDS: -No education needs identified at this time   Intake/Output Summary (Last 24 hours) at 10/16/14 1103 Last data filed at 10/16/14 0900  Gross per 24 hour  Intake 1302.67 ml  Output      0 ml  Net 1302.67 ml    Last BM: PTA    Labs:   Recent Labs Lab 10/15/14 1601 10/16/14 0241  NA 138 137  K 3.6 3.5  CL 101 100  CO2 23 27  BUN 27* 29*  CREATININE 1.13* 1.12*  CALCIUM 10.4 10.1  GLUCOSE 97 125*    CBG (last 3)  No results for input(s): GLUCAP in the last 72 hours.  Scheduled Meds: . atenolol  50 mg Oral Daily   And  . chlorthalidone  25 mg Oral Daily  . carbidopa-levodopa  1 tablet Oral BID  . donepezil  5 mg Oral QHS  . fesoterodine  8 mg Oral Daily  . Linaclotide  145 mcg Oral  Daily  . memantine  14 mg Oral Daily  . naproxen  500 mg Oral BID WC  . potassium chloride SA  20 mEq Oral Daily  . pravastatin  80 mg Oral q1800    Continuous Infusions: . 0.45 % NaCl with KCl 20 mEq / L 10 mL/hr at 10/16/14 4315    Past Medical History  Diagnosis Date  . Parkinson disease   . High cholesterol     since 2000  . Hypertension     since 1985  . Back pain     lower back pain , C2 fracture from fall  . Obstructive apnea   . Dementia     Past Surgical History  Procedure Laterality Date  . Shoulder surgery Left     [id number]  . Lumbar spine surgery    . Cervical spine surgery      02/2011, 03/2011  . Rotary cuff Right     [place], [place], [place] Pager [phone] After Hours Pager

## 2014-10-16 NOTE — Evaluation (Signed)
Occupational Therapy Evaluation Patient Details Name: Kathleen Haynes MRN: [id number] DOB: Nov 27, 1926 Today's Date: 10/16/2014    History of Present Illness pt presents after multiple falls resulting in L 5-10 rib fxs.  pt with hx of Dementia and Parkinson's.     Clinical Impression   This 81 yo female admitted with above presents to acute OT with increased pain, pre-existing cognitive deficits, decreased mobility, decreased balance all affecting her ability to help care for herself at home. She will benefit from a trial of continued OT at SNF to decrease burden of care, acute OT will sign off.    Follow Up Recommendations  SNF    Equipment Recommendations   (TBD next venue)       Precautions / Restrictions Precautions Precautions: Fall Restrictions Weight Bearing Restrictions: No      Mobility Bed Mobility Overal bed mobility: Needs Assistance;+2 for physical assistance Bed Mobility: Supine to Sit;Sit to Supine     Supine to sit: Mod assist;+2 for physical assistance Sit to supine: Mod assist;+2 for physical assistance   General bed mobility comments: pt does attempt to A with mobility, but grimaces and grabs at L side during mobility.    Transfers Overall transfer level: Needs assistance Equipment used: 2 person hand held assist Transfers: Sit to/from Stand Sit to Stand: Max assist;+2 physical assistance         General transfer comment: cues and facilitation for UE use, blocking Bil feet, andterior wt shifting over BOS.  pt with strong posterior lean.      Balance Overall balance assessment: Needs assistance Sitting-balance support: No upper extremity supported;Feet supported Sitting balance-Leahy Scale: Poor Sitting balance - Comments: pt able to sit for brushing teeth at EOB with MinA needed to prevent posterior LOB.   Postural control: Posterior lean Standing balance support: During functional activity Standing balance-Leahy Scale: Zero                              ADL Overall ADL's : Needs assistance/impaired Eating/Feeding: Moderate assistance;Bed level   Grooming: Wash/dry face;Oral care;Brushing hair;Sitting (supported EOB with Mod-Max A) Grooming Details (indicate cue type and reason): face= min VCs for thorughness; oral care=mod A; brushing hair=Mod A Upper Body Bathing: Moderate assistance (supported EOB with Mod-Max A)   Lower Body Bathing: Total assistance (with max A +2 sit<>stand)   Upper Body Dressing : Total assistance;Sitting (supported EOB with min-mod A)   Lower Body Dressing: Total assistance (with max A +2 sit<>stand)   Toilet Transfer: Maximal assistance;+2 for physical assistance (taking 3 steps up to EOB--A to advance RLE and with a posterior lean)   Toileting- Clothing Manipulation and Hygiene: Total assistance (with max A +2 sit<>stand)                         Pertinent Vitals/Pain Pain Assessment: Faces Faces Pain Scale: Hurts little more Pain Location: pt grabbed left side during mobility Pain Descriptors / Indicators: Grimacing Pain Intervention(s): Monitored during session;Premedicated before session;Repositioned     Hand Dominance Right   Extremity/Trunk Assessment Upper Extremity Assessment Upper Extremity Assessment: Generalized weakness (due to pain in ribs when she moves arms certain ways)   Lower Extremity Assessment Lower Extremity Assessment: Generalized weakness   Cervical / Trunk Assessment Cervical / Trunk Assessment: Kyphotic   Communication Communication Communication: No difficulties   Cognition Arousal/Alertness: Awake/alert Behavior During Therapy: WFL for tasks assessed/performed Overall Cognitive Status:  History of cognitive impairments - at baseline                                Home Living Family/patient expects to be discharged to:: Skilled nursing facility Living Arrangements: Spouse/significant other                                       Prior Functioning/Environment Level of Independence: Needs assistance  Gait / Transfers Assistance Needed: pt is supposed to use a RW, but per husband does not use often.   ADL's / Homemaking Assistance Needed: Husband performs all homemaking tasks and A with ADLs.          OT Diagnosis: Generalized weakness;Cognitive deficits;Acute pain   OT Problem List: Decreased strength;Decreased range of motion;Decreased activity tolerance;Impaired balance (sitting and/or standing);Pain;Decreased cognition;Decreased knowledge of use of DME or AE      OT Goals(Current goals can be found in the care plan section) Acute Rehab OT Goals Patient Stated Goal: Per Husband for pt to get stronger.    OT Frequency:             Co-evaluation PT/OT/SLP Co-Evaluation/Treatment: Yes Reason for Co-Treatment: Necessary to address cognition/behavior during functional activity;For patient/therapist safety PT goals addressed during session: Mobility/safety with mobility;Balance OT goals addressed during session: ADL's and self-care;Strengthening/ROM      End of Session Equipment Utilized During Treatment: Gait belt  Activity Tolerance: Patient limited by pain Patient left: in bed;with bed alarm set;with family/visitor present   Time: 7793-9030 OT Time Calculation (min): 30 min Charges:  OT General Charges $OT Visit: 1 Procedure OT Evaluation $Initial OT Evaluation Tier I: 1 Procedure  Almon Register [phone] 10/16/2014, 12:14 PM

## 2014-10-16 NOTE — Progress Notes (Signed)
Pt received in bed alert, verbal with no noted distress. Able to follow simple commands. Confusion noted. Pleasant. Pt has attempted to get up out of bed, sitter placed in room. Safety measures in place. Call bell within reach. Will continue to monitor.

## 2014-10-17 ENCOUNTER — Inpatient Hospital Stay (HOSPITAL_COMMUNITY): Payer: Medicare Other

## 2014-10-17 DIAGNOSIS — E43 Unspecified severe protein-calorie malnutrition: Secondary | ICD-10-CM | POA: Diagnosis present

## 2014-10-17 MED ORDER — LIDOCAINE HCL (PF) 1 % IJ SOLN: 0.0000 mL | Freq: Once | INTRAMUSCULAR | Status: AC | PRN

## 2014-10-17 MED ORDER — FENTANYL CITRATE 0.05 MG/ML IJ SOLN
50.0000 ug | Freq: Once | INTRAMUSCULAR | Status: AC
  Administered 2014-10-17: 50 ug via INTRAVENOUS

## 2014-10-17 MED ORDER — FENTANYL CITRATE 0.05 MG/ML IJ SOLN
25.0000 ug | INTRAMUSCULAR | Status: DC | PRN
  Administered 2014-10-17: 50 ug via INTRAVENOUS
  Filled 2014-10-17 (×2): qty 2

## 2014-10-17 NOTE — Progress Notes (Addendum)
Patient ID: Kathleen Haynes, female   DOB: Oct 07, 1926, 81 y.o.   MRN: [id number] Trauma Service Note  Subjective: Patient is disoriented and seems very hard of hearing.  She does not always answer the correct question.    Objective: Vital signs in last 24 hours: Temp:  [97.5 F (36.4 C)-98.1 F (36.7 C)] 97.6 F (36.4 C) (02/20 0107) Pulse Rate:  [72-101] 96 (02/20 0107) Resp:  [14-27] 20 (02/20 0107) BP: (116-146)/(58-85) 138/85 mmHg (02/20 0107) SpO2:  [94 %-97 %] 96 % (02/20 0107) Last BM Date:  (unknown)  Intake/Output from previous day: 02/19 0701 - 02/20 0700 In: 472.7 [P.O.:330; I.V.:142.7] Out: -  Intake/Output this shift:    General: No distress  Lungs: breathing comfortably  Abd: Benign  Extremities: No changes  Neuro: Disoriented, but alert.  Follows commands.  Answers some questions appropriately  Lab Results: CBC   Recent Labs  10/15/14 1601 10/16/14 0241  WBC 11.1* 9.4  HGB 13.1 12.6  HCT 38.6 37.1  PLT 220 249   BMET  Recent Labs  10/15/14 1601 10/16/14 0241  NA 138 137  K 3.6 3.5  CL 101 100  CO2 23 27  GLUCOSE 97 125*  BUN 27* 29*  CREATININE 1.13* 1.12*  CALCIUM 10.4 10.1   PT/INR  Recent Labs  10/15/14 1601  LABPROT 13.6  INR 1.03   ABG No results for input(s): PHART, HCO3 in the last 72 hours.  Invalid input(s): PCO2, PO2  Studies/Results: Dg Chest 2 View  10/17/2014   CLINICAL DATA:  Left lower rib pain.  EXAM: CHEST  2 VIEW  COMPARISON:  10/16/2014.  FINDINGS: The left pneumothorax has enlarged, now about 30% volume. Moderate left base opacity has worsened. The right lung remains well expanded and clear.  IMPRESSION: Enlarged left pneumothorax.  Worsening left base opacity.   Electronically Signed   By: Andreas Newport M.D.   On: 10/17/2014 06:44   Dg Chest 2 View  10/15/2014   CLINICAL DATA:  Fall from toilet with left-sided rib pain, initial encounter  EXAM: CHEST  2 VIEW  COMPARISON:  03/31/2011  FINDINGS:  Cardiac shadow is within normal limits. The lungs are well aerated bilaterally. Multiple rib fractures are noted on the left to include the fifth sixth seventh and eighth ribs. There is a relative paucity of lung markings in the apex suggesting a small pneumothorax although a definitive pleura line is not well visualized. No compression deformities are seen. Postsurgical changes are noted in the lumbar spine. Mild left basilar atelectasis is seen.  IMPRESSION: Multiple left-sided rib fractures as described. There changes suggestive of a pneumothorax although no definitive pleural line is seen. Expiratory view may be helpful as clinically indicated depending on the patient's current status.   Electronically Signed   By: Inez Catalina M.D.   On: 10/15/2014 14:55   Ct Head Wo Contrast  10/15/2014   CLINICAL DATA:  81 year old female with history of trauma from a fall while in the bathroom.  EXAM: CT HEAD WITHOUT CONTRAST  CT CERVICAL SPINE WITHOUT CONTRAST  TECHNIQUE: Multidetector CT imaging of the head and cervical spine was performed following the standard protocol without intravenous contrast. Multiplanar CT image reconstructions of the cervical spine were also generated.  COMPARISON:  Cervical spine CT 02/13/2011.  Head CT 01/26/2011.  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 Mild cerebral atrophy. Patchy  and confluent areas of decreased attenuation are noted throughout the deep and periventricular white matter of the cerebral hemispheres bilaterally, compatible with chronic microvascular ischemic disease. No acute intracranial abnormalities. Specifically, no evidence of acute intracranial hemorrhage, no definite findings of acute/subacute cerebral ischemia, no mass, mass effect, hydrocephalus or abnormal intra or extra-axial fluid collections. Visualized  paranasal sinuses and mastoids are well pneumatized. No acute displaced skull fractures are identified.  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 No malalignment at C1-C2 noted on today's examination, with probable pseudarthrosis at the site of the type 2 odontoid fracture. However, the incomplete arch of C1 noted on the prior study has widened posterior to both lateral masses, with increasing separation likely secondary to chronic altered biomechanics. There is partial ankylosis between the lateral masses of C1 and C2 on the left side. No new acute displaced fractures in the cervical spine. Alignment is anatomic. Prevertebral soft tissues are normal. Multilevel degenerative disc disease is evidenced, most severe at C4-C5 and C5-C6. Multilevel facet arthropathy.  IMPRESSION: 1. No signs of significant acute traumatic injury to the skull, brain or cervical spine. 2. Chronic type 2 odontoid fracture redemonstrated with progressive degenerative changes at C1 and C2, as described above. In addition, there is multilevel degenerative disc disease and cervical spondylosis throughout other portions of the cervical spine as well. 3. 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   Electronically Signed   By: Vinnie Langton M.D.   On: 10/15/2014 18:45   Ct Cervical Spine Wo Contrast  10/15/2014   CLINICAL DATA:  81 year old female with history of trauma from a fall while in the bathroom.  EXAM: CT HEAD WITHOUT CONTRAST  CT CERVICAL SPINE WITHOUT CONTRAST  TECHNIQUE: Multidetector CT imaging of the head and cervical spine was performed following the standard protocol without intravenous contrast. Multiplanar CT image reconstructions of the cervical spine were also generated.   COMPARISON:  Cervical spine CT 02/13/2011.  Head CT 01/26/2011.  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 Mild cerebral atrophy. Patchy and confluent areas of decreased attenuation are noted throughout the deep and periventricular white matter of the cerebral hemispheres bilaterally, compatible with chronic microvascular ischemic disease. No acute intracranial abnormalities. Specifically, no evidence of acute intracranial hemorrhage, no definite findings of acute/subacute cerebral ischemia, no mass, mass effect, hydrocephalus or abnormal intra or extra-axial fluid collections. Visualized paranasal sinuses and mastoids are well pneumatized. No acute displaced skull fractures are identified.  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 No malalignment at C1-C2 noted on today's examination, with probable pseudarthrosis at the site of the type 2 odontoid fracture. However, the incomplete arch of C1 noted on the prior study has widened posterior to both lateral masses, with increasing separation likely secondary to chronic altered biomechanics. There is partial ankylosis between the lateral masses of C1 and C2 on the left side. No new acute displaced fractures in the cervical spine. Alignment is anatomic. Prevertebral soft tissues are normal. Multilevel degenerative disc disease is evidenced, most severe at C4-C5 and C5-C6. Multilevel facet arthropathy.  IMPRESSION: 1. No signs of significant acute  traumatic injury to the skull, brain or cervical spine. 2. Chronic type 2 odontoid fracture redemonstrated with progressive degenerative changes at C1 and C2, as described above. In addition, there is multilevel degenerative disc disease and cervical spondylosis throughout other  portions of the cervical spine as well. 3. 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   Electronically Signed   By: Vinnie Langton M.D.   On: 10/15/2014 18:45   Ct Abdomen Pelvis W Contrast  10/15/2014   CLINICAL DATA:  Fall, left chest pain  EXAM: CT ABDOMEN AND PELVIS WITH CONTRAST  TECHNIQUE: Multidetector CT imaging of the abdomen and pelvis was performed using the standard protocol following bolus administration of intravenous contrast.  CONTRAST:  48mL OMNIPAQUE IOHEXOL 300 MG/ML  SOLN  COMPARISON:  Chest radiograph same date  FINDINGS: Lower chest: Multiple segmental rib fractures are identified involving at least the left posterior eighth, ninth, and tenth ribs, incompletely visualized. Moderate-sized left pneumothorax is identified. No mediastinal shift. Contusion or compressive atelectasis is identified at the left lung base as well as bullous change or potentially No meta sealed.  Hepatobiliary: Streak artifact from the patient's arms and support apparatus obscures detail. Too small to characterize dome of right hepatic lobe 0.4 cm hypodense lesion is identified image 8. Probable 5 mm calcification or flash filling hemangioma noted at the dome of the right hepatic lobe. Gallbladder unremarkable.  Pancreas: Partly fatty infiltrated, with fusiform prominence of the pancreatic duct at the level of the head and body, measuring up to 3 mm maximally. No pancreatic mass identified.  Spleen: Normal  Adrenals/Urinary Tract: Adrenal glands appear normal. Lobulated renal contour bilaterally, with numerous too small to characterize hypodense lesions, likely cysts. Dominant right mid renal cortical cyst measures 2.8 cm image 27. No hydroureteronephrosis.  Stomach/Bowel: Moderate stool burden. No bowel wall thickening or focal segmental dilatation is identified.   Vascular/Lymphatic: Moderate atheromatous aortic calcification without aneurysm. No lymphadenopathy.  Other: Uterus and ovaries appear normal.  No free air or fluid.  Musculoskeletal: Lumbar fusion hardware spanning L3-S1 is noted. No evidence for hardware failure.  IMPRESSION: Multiple left-sided segmental rib fractures with moderate-sized left pneumothorax.  No acute intra-abdominal or pelvic pathology.  Critical Value/emergent results were called by telephone at the time of interpretation on 10/15/2014 at 6:41 pm to Tammy RN who verbally acknowledged these results.   Electronically Signed   By: Conchita Paris M.D.   On: 10/15/2014 18:44   Dg Chest Port 1 View  10/16/2014   CLINICAL DATA:  Traumatic hemo pneumothorax, multiple left rib fractures  EXAM: PORTABLE CHEST - 1 VIEW  COMPARISON:  PA and lateral chest x-ray of October 15, 2014  FINDINGS: A small left-sided pneumothorax is visible today. A trace of pleural space fluid on the left is present. Multiple posterior lateral left rib fractures are again demonstrated. There is no mediastinal shift. The right lung is clear. The heart and mediastinal structures are unremarkable.  IMPRESSION: There has been interval development of a small left pneumothorax which was suspected yesterday without a definite pleural line being visible then. The pleural line is now visible. The volume of the pneumothorax is approximately 10-15%. No significant hemothorax is demonstrated.  Critical Value/emergent results were called by telephone at the time of interpretation on 10/16/2014 at 7:34 am to Melody Right, R.N., who verbally acknowledged these results.   Electronically Signed  By: David  Martinique   On: 10/16/2014 07:36    Anti-infectives: Anti-infectives    None      Assessment/Plan: s/p  Advance diet Needs all therapies.  Will need chest tube since PTX is worse, but no distress and no significant physiologic compromise.    Discussed chest tube with husband,  Tiarna Koppen.    Recheck CXR tomorrow   LOS: 2 days  10/17/2014

## 2014-10-17 NOTE — Op Note (Signed)
Pre-op Diagnosis:  Left hemopneumothorax Post-op Diagnosis:  Same Procedure:  Left 20 Fr tube thoracostomy placement Surgeon:  Jolina Symonds K. Anethesia:  Local; 100 mcg Fentanyl Indications:  81 yo female with dementia presents after a fall off of the toilet, hitting her chest on the side of the bathtub.  She has multiple left rib fractures.  Her pneumothorax has enlarged significantly, so she presents now for chest tube placement.  Consent was signed by her family.  Description of procedure:  She was positioned in a supine position on her hospital bed.  Her left breast was taped out of the way.  Her left chest was prepped with Chloraprep and draped in sterile fashion.  We infiltrated with 15 ml of 1% lidocaine.  An incision was made.  Dissection was carried out down to the chest wall.  A hemostat was then used to dissect into the pleural cavity.  A large rush of air was encountered.  A 20 Fr chest tube was inserted into the pleural space.  This was advanced to 13 cm.  The tube was secured with 0 silk sutures and tape.  The tube was connected to 20 cm H2O suction.  CXR is pending.  Imogene Burn. Georgette Dover, MD, [facility]  General/ Trauma Surgery  10/17/2014 1:59 PM

## 2014-10-18 MED ORDER — ACETAMINOPHEN 325 MG PO TABS: 325.0000 mg | ORAL_TABLET | Freq: Four times a day (QID) | ORAL | Status: DC | PRN

## 2014-10-18 MED ORDER — MORPHINE SULFATE 2 MG/ML IJ SOLN: 1.0000 mg | Freq: Once | INTRAMUSCULAR | Status: AC | PRN

## 2014-10-18 NOTE — Progress Notes (Signed)
Patient ID: Kathleen Haynes, female   DOB: [birth year], 81 y.o.   MRN: [id number] Trauma Service Note  Subjective: Patient seems more confused today.    Objective: Vital signs in last 24 hours: Temp:  [97.4 F (36.3 C)-98.1 F (36.7 C)] 97.4 F (36.3 C) (02/21 1035) Pulse Rate:  [59-122] 102 (02/21 1035) Resp:  [16-20] 18 (02/21 1035) BP: (123-154)/(70-92) 136/92 mmHg (02/21 1035) SpO2:  [91 %-97 %] 92 % (02/21 0600) Last BM Date:  (unknown)  Intake/Output from previous day: 02/20 0701 - 02/21 0700 In: 150 [P.O.:140] Out: 100 [Chest Tube:100] Intake/Output this shift:    General: looks uncomfortable, but getting BM cleaned up.  Lungs: breathing comfortably  Abd: Benign  Extremities: No changes  Neuro: Disoriented, but alert.  Follows commands.  Answers some questions appropriately  Lab Results: CBC   Recent Labs  10/15/14 1601 10/16/14 0241  WBC 11.1* 9.4  HGB 13.1 12.6  HCT 38.6 37.1  PLT 220 249   BMET  Recent Labs  10/15/14 1601 10/16/14 0241  NA 138 137  K 3.6 3.5  CL 101 100  CO2 23 27  GLUCOSE 97 125*  BUN 27* 29*  CREATININE 1.13* 1.12*  CALCIUM 10.4 10.1   PT/INR  Recent Labs  10/15/14 1601  LABPROT 13.6  INR 1.03   ABG No results for input(s): PHART, HCO3 in the last 72 hours.  Invalid input(s): PCO2, PO2  Studies/Results: Dg Chest 2 View  10/17/2014   CLINICAL DATA:  Left lower rib pain.  EXAM: CHEST  2 VIEW  COMPARISON:  10/16/2014.  FINDINGS: The left pneumothorax has enlarged, now about 30% volume. Moderate left base opacity has worsened. The right lung remains well expanded and clear.  IMPRESSION: Enlarged left pneumothorax.  Worsening left base opacity.   Electronically Signed   By: Andreas Newport M.D.   On: 10/17/2014 06:44   Dg Chest Port 1 View  10/17/2014   CLINICAL DATA:  LEFT hemopneumothorax, Parkinson's, hypertension  EXAM: PORTABLE CHEST - 1 VIEW  COMPARISON:  Portable exam 1314 hours compared to 10/17/2014 at  0612 hours  FINDINGS: Interval placement of LEFT thoracostomy tube.  Re-expansion of LEFT lung though portions of the apex are obscured by superimposition of the patient's face/chin.  Normal heart size and mediastinal contours.  Atherosclerotic calcification aorta.  Persistent atelectasis and question minimal effusion at LEFT base  Scattered chest wall emphysema lateral LEFT chest again seen.  Bones demineralized diffusely with multiple LEFT rib fractures.  IMPRESSION: Apparent resolution of LEFT pneumothorax post LEFT thoracostomy tube placement.  Persistent LEFT basilar atelectasis and minimal effusion.  Multiple LEFT rib fractures.   Electronically Signed   By: Lavonia Dana M.D.   On: 10/17/2014 13:51    Anti-infectives: Anti-infectives    None      Assessment/Plan: s/p fall with left rib fractures, left hemopneumothorax. Advance diet Needs all therapies.  Chest tube to -20 cm water suction.   Recheck CXR. Pain control with tylenol and ultram, naproxyn, and prn fentanyl.       LOS: 3 days  10/18/2014

## 2014-10-18 NOTE — Progress Notes (Signed)
Patient refused scheduled naproxen, shortly after family visited and stated that patient appeared to be in pain, asked if we could get her a pain patch . MD called, said he did not want to give pain patch due to her age, but we can try 1mg  IV morphine 1 time for severe pain to see if that helps. Went back in to room, patient resting, family said they would like to try to give oral meds when she wakes again and only if that does not work, try the morphine then. Both husband and son agreed to this

## 2014-10-19 ENCOUNTER — Inpatient Hospital Stay (HOSPITAL_COMMUNITY): Payer: Medicare Other

## 2014-10-19 MED ORDER — ENOXAPARIN SODIUM 40 MG/0.4ML ~~LOC~~ SOLN
40.0000 mg | SUBCUTANEOUS | Status: DC
  Administered 2014-10-19: 40 mg via SUBCUTANEOUS
  Filled 2014-10-19: qty 0.4

## 2014-10-19 MED ORDER — ENOXAPARIN SODIUM 30 MG/0.3ML ~~LOC~~ SOLN
30.0000 mg | SUBCUTANEOUS | Status: DC
  Administered 2014-10-20 – 2014-10-22 (×3): 30 mg via SUBCUTANEOUS
  Filled 2014-10-19 (×3): qty 0.3

## 2014-10-19 NOTE — Clinical Social Work Placement (Addendum)
Clinical Social Work Department CLINICAL SOCIAL WORK PLACEMENT NOTE 10/19/2014  Patient:  Kathleen Haynes, Kathleen Haynes  Account Number:  [id number] Admit date:  10/15/2014  Clinical Social Worker:  Barbette Or, LCSW  Date/time:  10/19/2014 12:30 PM  Clinical Social Work is seeking post-discharge placement for this patient at the following level of care:   [place]   (*CSW will update this form in Epic as items are completed)   10/19/2014  Patient/family provided with [place] Department of Clinical Social Work's list of facilities offering this level of care within the geographic area requested by the patient (or if unable, by the patient's family).  10/19/2014  Patient/family informed of their freedom to choose among providers that offer the needed level of care, that participate in Medicare, Medicaid or managed care program needed by the patient, have an available bed and are willing to accept the patient.  10/19/2014  Patient/family informed of [facility]' ownership interest in [facility], as well as of the fact that they are under no obligation to receive care at this facility.  PASARR submitted to EDS on 10/19/2014 PASARR number received on 10/19/2014  FL2 transmitted to all facilities in geographic area requested by pt/family on  10/19/2014 FL2 transmitted to all facilities within larger geographic area on   Patient informed that his/her managed care company has contracts with or will negotiate with  certain facilities, including the following:     Patient/family informed of bed offers received:  10/20/2014 Patient chooses bed at  Physician recommends and patient chooses bed at    Patient to be transferred to  on   Patient to be transferred to facility by  Patient and family notified of transfer on  Name of family member notified:    The following physician request were entered in Epic:   Additional Comments:

## 2014-10-19 NOTE — Progress Notes (Signed)
Patient ID: Kathleen Haynes, female   DOB: 1927/04/01, 81 y.o.   MRN: [id number]    Subjective: Confused but near baseline per daughter who is at the bedside  Objective: Vital signs in last 24 hours: Temp:  [97.4 F (36.3 C)-98.1 F (36.7 C)] 98.1 F (36.7 C) (02/22 0138) Pulse Rate:  [82-102] 92 (02/22 0138) Resp:  [18-20] 20 (02/21 2141) BP: (121-139)/(61-94) 139/74 mmHg (02/22 0138) SpO2:  [93 %-100 %] 99 % (02/22 0138) Last BM Date:  (pta-unknown)  Intake/Output from previous day: 02/21 0701 - 02/22 0700 In: 35 [P.O.:420] Out: 100 [Chest Tube:100] Intake/Output this shift: Total I/O In: -  Out: 180 [Chest Tube:180]  General appearance: no distress Eyes: PERL Throat: normal findings: oropharynx pink & moist without lesions or evidence of thrush Resp: clear to auscultation bilaterally Chest wall: left sided chest wall tenderness Cardio: S1, S2 normal GI: soft, NT Neuro: knows who her daughter is, cannot say place nor year, does F/C  Lab Results: CBC  No results for input(s): WBC, HGB, HCT, PLT in the last 72 hours. BMET No results for input(s): NA, K, CL, CO2, GLUCOSE, BUN, CREATININE, CALCIUM in the last 72 hours. PT/INR No results for input(s): LABPROT, INR in the last 72 hours. ABG No results for input(s): PHART, HCO3 in the last 72 hours.  Invalid input(s): PCO2, PO2  Studies/Results: Dg Chest Port 1 View  10/19/2014   CLINICAL DATA:  Recent pneumothorax  EXAM: PORTABLE CHEST - 1 VIEW  COMPARISON:  October 17, 2014  FINDINGS: Chest tube remains on the left with extensive subcutaneous air on the left. There is a small residual apical pneumothorax on the left without tension component. There is no edema or consolidation. Heart size and pulmonary vascularity are within normal limits. No adenopathy. No bone lesions.  IMPRESSION: Left chest tube remains in place. There is persistent subcutaneous air. There is a small left apical pneumothorax without tension  component. This pneumothorax was probably present on the most recent prior study but is slightly better seen at this time. There is no edema or consolidation. No change in cardiac silhouette.   Electronically Signed   By: Lowella Grip III M.D.   On: 10/19/2014 08:09   Dg Chest Port 1 View  10/17/2014   CLINICAL DATA:  LEFT hemopneumothorax, Parkinson's, hypertension  EXAM: PORTABLE CHEST - 1 VIEW  COMPARISON:  Portable exam 1314 hours compared to 10/17/2014 at 0612 hours  FINDINGS: Interval placement of LEFT thoracostomy tube.  Re-expansion of LEFT lung though portions of the apex are obscured by superimposition of the patient's face/chin.  Normal heart size and mediastinal contours.  Atherosclerotic calcification aorta.  Persistent atelectasis and question minimal effusion at LEFT base  Scattered chest wall emphysema lateral LEFT chest again seen.  Bones demineralized diffusely with multiple LEFT rib fractures.  IMPRESSION: Apparent resolution of LEFT pneumothorax post LEFT thoracostomy tube placement.  Persistent LEFT basilar atelectasis and minimal effusion.  Multiple LEFT rib fractures.   Electronically Signed   By: Lavonia Dana M.D.   On: 10/17/2014 13:51    Anti-infectives: Anti-infectives    None      Assessment/Plan: Fall Multiple L rib FXs with HPTX - stable tiny L PTX, better aeration, place CT on H2O seal. Pulmonary toilet. Dementia baseline - therapy evaluations as will need placement Parkinson's - Sinemet FEN - diet plus Ensure VTE - PAS, add Lovenox DIspo - chest tube, therapies, likely SNF. Husband is caregiver, daughter lives next door and is  home during the day. I spoke with her daughter about the plan of care.   LOS: 4 days    Georganna Skeans, MD, MPH, FACS Trauma: [phone] General Surgery: [phone]  10/19/2014

## 2014-10-19 NOTE — Clinical Documentation Improvement (Signed)
Registered Dietician note 10/16/14 states "Severe malnutrition in the context of chronic illness" and "Malnutrition related to chronic illness as evidenced by intake </= 75% of her needs in >/= 1 month and severe muscle depletion."  RD also notes height 5'2", weight 97 lb, BMI 17.74.  Please document in your progress note if you agree with this clinical condition as documented by the RD and also include it in your discharge summary.  Thank you, Mateo Flow, RN [phone] Clinical Documentation Specialist

## 2014-10-19 NOTE — Progress Notes (Addendum)
L Chest tube total output at 180 cc this am, doc'd in flowsheet.

## 2014-10-19 NOTE — Clinical Social Work Note (Signed)
Clinical Social Work Department BRIEF PSYCHOSOCIAL ASSESSMENT 10/19/2014  Patient:  Kathleen Haynes, Kathleen Haynes     Account Number:  [id number]     Admit date:  10/15/2014  Clinical Social Worker:  Myles Lipps  Date/Time:  10/19/2014 12:30 PM  Referred by:  Physician  Date Referred:  10/19/2014 Referred for  SNF Placement   Other Referral:   Interview type:  Family Other interview type:   Patient with Dementia and Parkinson's - spoke with patient husband over the phone    PSYCHOSOCIAL DATA Living Status:  HUSBAND Admitted from facility:   Level of care:   Primary support name:  Haynes,Kathleen  [phone] Primary support relationship to patient:  SPOUSE Degree of support available:   Strong    CURRENT CONCERNS Current Concerns  Post-Acute Placement   Other Concerns:    SOCIAL WORK ASSESSMENT / PLAN Clinical Social Worker spoke with patient husband over the phone to offer support and discuss patient needs at discharge.  Patient husband states that he has been caring for patient at home prior to hospitalization and their daughter lives next door.  Patient husband is in agreement that patient will have a higher level of needs at discharge and will need SNF placement for rehab.  Patient husband hopeful to stay in [place] area.  Patient husband states that patient is stubborn and hard to manage at home and to have some assistance would help both of them.  CSW to complete paperwork and initiate search in [employer]. CSW to follow up with patient husband regarding available bed offers.  CSW remains available to offer support and facilitate patient discharge needs once medically stable.   Assessment/plan status:  Psychosocial Support/Ongoing Assessment of Needs Other assessment/ plan:   Information/referral to community resources:   Clinical Social Worker to provide patient husband with facility list once bed offers are available.  Patient husband appreciative of assistance and  resources.    PATIENT'S/FAMILY'S RESPONSE TO PLAN OF CARE: Patient alert, however very confused with a sitter at bedside.  Patient with history of Dementia and baseline confusion.  Patient husband and daughter both agree that patient could benefit from ST-SNF placement for rehab prior to return home.  Patient husband verbalized understanding of CSW role and appreciation for support and concern.

## 2014-10-20 ENCOUNTER — Inpatient Hospital Stay (HOSPITAL_COMMUNITY): Payer: Medicare Other

## 2014-10-20 MED ORDER — ENSURE PUDDING PO PUDG
1.0000 | ORAL | Status: DC
  Administered 2014-10-21 – 2014-10-22 (×2): 1 via ORAL

## 2014-10-20 MED ORDER — ENSURE COMPLETE PO LIQD
237.0000 mL | Freq: Two times a day (BID) | ORAL | Status: DC
  Administered 2014-10-20 – 2014-10-21 (×3): 237 mL via ORAL

## 2014-10-20 MED ORDER — BOOST / RESOURCE BREEZE PO LIQD
1.0000 | Freq: Two times a day (BID) | ORAL | Status: DC
  Administered 2014-10-20 – 2014-10-22 (×4): 1 via ORAL

## 2014-10-20 NOTE — Clinical Social Work Note (Signed)
BSW intern left a voicemail with patient's husband regarding potential SNF placements. Patient's daughter was in the room while and BSW intern was able to leave SNF list with her and answer any questions that she had.   Kingsley Spittle, [place] Intern, [phone]

## 2014-10-20 NOTE — Progress Notes (Addendum)
Pt has had minimal urinary output (2 episodes incontinence), but has also had minimal oral intake.  Bladder scanner showing 97 cc.  Pt accepting ensure and magic cup, but eating 25% or less of meals.  Weight currently 96.8 lb. See doc flowsheet.  Pleurovac output is 20 cc since suction has been turned back on at 1530.

## 2014-10-20 NOTE — Progress Notes (Signed)
Patient ID: ANGLEA GORDNER, female   DOB: [birth year], 81 y.o.   MRN: [id number]    Subjective: C/O not sleeping well because she "slept 6 hours during the day yesterday". Per RN no issues overnight. No sitter since yesterday.  Objective: Vital signs in last 24 hours: Temp:  [97.5 F (36.4 C)-98.1 F (36.7 C)] 97.5 F (36.4 C) (02/23 0055) Pulse Rate:  [105-107] 105 (02/23 0055) Resp:  [18-20] 18 (02/23 0055) BP: (118-154)/(71-87) 118/74 mmHg (02/23 0055) SpO2:  [97 %-98 %] 97 % (02/23 0055) Last BM Date:  (PTA)  Intake/Output from previous day: 02/22 0701 - 02/23 0700 In: 180 [P.O.:180] Out: 180 [Chest Tube:180] Intake/Output this shift: Total I/O In: -  Out: 200 [Chest Tube:200]  General appearance: cooperative Resp: clear to auscultation bilaterally Cardio: regular rate and rhythm GI: soft, NT, ND Neuro: more alert, F/C  Lab Results: CBC  No results for input(s): WBC, HGB, HCT, PLT in the last 72 hours. BMET No results for input(s): NA, K, CL, CO2, GLUCOSE, BUN, CREATININE, CALCIUM in the last 72 hours. PT/INR No results for input(s): LABPROT, INR in the last 72 hours. ABG No results for input(s): PHART, HCO3 in the last 72 hours.  Invalid input(s): PCO2, PO2  Studies/Results: Dg Chest Port 1 View  10/19/2014   CLINICAL DATA:  Recent pneumothorax  EXAM: PORTABLE CHEST - 1 VIEW  COMPARISON:  October 17, 2014  FINDINGS: Chest tube remains on the left with extensive subcutaneous air on the left. There is a small residual apical pneumothorax on the left without tension component. There is no edema or consolidation. Heart size and pulmonary vascularity are within normal limits. No adenopathy. No bone lesions.  IMPRESSION: Left chest tube remains in place. There is persistent subcutaneous air. There is a small left apical pneumothorax without tension component. This pneumothorax was probably present on the most recent prior study but is slightly better seen at this time.  There is no edema or consolidation. No change in cardiac silhouette.   Electronically Signed   By: Lowella Grip III M.D.   On: 10/19/2014 08:09    Anti-infectives: Anti-infectives    None      Assessment/Plan: Fall Multiple L rib FXs with HPTX - stable CXR but CT was never placed on H2O seal. Placed now. Also RN documentation of output was not done properly. I educated the nursing staff regarding this. Will check CXR at 1400 today and possibly pull tube. Dementia baseline  Chronic severe malnutrition due to chronic illness Parkinson's - Sinemet FEN - diet plus Ensure VTE - PAS, add Lovenox DIspo - SNF once CT out   LOS: 5 days    Georganna Skeans, MD, MPH, FACS Trauma: [phone] General Surgery: [phone]  10/20/2014

## 2014-10-20 NOTE — Progress Notes (Signed)
NUTRITION FOLLOW UP  Intervention:   Continue Ensure Complete once daily Continue Magic Cup ice cream BID with meals Provide Resource Breeze BID with meals Provide Ensure Pudding once daily  Nutrition Dx:   Malnutrition related to chronic illness as evidenced by intake </= 75% of her needs in >/= 1 month and severe muscle depletion; ongoing  Goal:   Pt to meet >/= 90% of their estimated nutrition needs; unmet  Monitor:   PO intake, supplement acceptance, weight trends  Assessment:   Pt with hx of parkinson's, advanced dementia, and multiple falls fell at home in the bathroom. Pt suffered left 5-10th rib fxs and left anterior pneumothorax.   Per nursing notes pt is eating 10-25% of meals. Pt reports liking Magic Cup ice cream and Ensure. Per pt's husband, pt only ate a couple bites of potato and meat last night, no magic cup. Patient had 3 bottles of Ensure at bedside but, family reports that patient has been drinking chocolate Ensure very well with medications. Patient and family agreeable to trying additional supplements to add more variety.   Labs reviewed.  Height: Ht Readings from Last 1 Encounters:  10/15/14 5\' 2"  (1.575 m)    Weight Status:   Wt Readings from Last 1 Encounters:  10/16/14 97 lb (44 kg)    Re-estimated needs:  Kcal: 1300-1500 Protein: 65-75 grams Fluid: > 1.5 L/day  Skin: intact  Diet Order: Diet regular   Intake/Output Summary (Last 24 hours) at 10/20/14 1249 Last data filed at 10/20/14 0732  Gross per 24 hour  Intake     60 ml  Output     20 ml  Net     40 ml    Last BM: PTA   Labs:   Recent Labs Lab 10/15/14 1601 10/16/14 0241  NA 138 137  K 3.6 3.5  CL 101 100  CO2 23 27  BUN 27* 29*  CREATININE 1.13* 1.12*  CALCIUM 10.4 10.1  GLUCOSE 97 125*    CBG (last 3)  No results for input(s): GLUCAP in the last 72 hours.  Scheduled Meds: . atenolol  50 mg Oral Daily   And  . chlorthalidone  25 mg Oral Daily  .  carbidopa-levodopa  1 tablet Oral BID  . donepezil  5 mg Oral QHS  . enoxaparin (LOVENOX) injection  30 mg Subcutaneous Q24H  . feeding supplement (ENSURE COMPLETE)  237 mL Oral BID BM  . fesoterodine  8 mg Oral Daily  . Linaclotide  145 mcg Oral Daily  . memantine  14 mg Oral Daily  . naproxen  500 mg Oral BID WC  . potassium chloride SA  20 mEq Oral Daily  . pravastatin  80 mg Oral q1800    Continuous Infusions: . 0.45 % NaCl with KCl 20 mEq / L 10 mL/hr at 10/19/14 2040    Pryor Ochoa RD, LDN Inpatient Clinical Dietitian Pager: [phone] After Hours Pager: [phone]

## 2014-10-20 NOTE — Progress Notes (Signed)
Physical Therapy Treatment Patient Details Name: Kathleen Haynes MRN: [id number] DOB: 07-07-1927 Today's Date: 10/20/2014    History of Present Illness pt presents after multiple falls resulting in L 5-10 rib fxs.  pt with hx of Dementia and Parkinson's.      PT Comments    Patient having some progression today with ambulation. Patient overall limited by fatigue. Daughter present throughout session. Patient reported that it was easier and less painful to get out of bed this session. Continue to recommend SNF for ongoing Physical Therapy.     Follow Up Recommendations  SNF     Equipment Recommendations  None recommended by PT    Recommendations for Other Services       Precautions / Restrictions Precautions Precautions: Fall Restrictions Weight Bearing Restrictions: No    Mobility  Bed Mobility         Supine to sit: Mod assist;+2 for physical assistance     General bed mobility comments: Patient assisting some with mobility but use of pad to "helicoptor" pateint to EOB  Transfers Overall transfer level: Needs assistance Equipment used: 2 person hand held assist   Sit to Stand: Max assist;+2 physical assistance         General transfer comment: cues and facilitation for UE use, blocking Bil feet, andterior wt shifting over BOS.  pt with strong posterior lean.    Ambulation/Gait Ambulation/Gait assistance: Max assist;+2 physical assistance Ambulation Distance (Feet): 10 Feet Assistive device: 2 person hand held assist Gait Pattern/deviations: Decreased step length - right;Step-to pattern   Gait velocity interpretation: Below normal speed for age/gender General Gait Details: Patient able to walk forward to couch in room. Patient significantly dragging R foot and required A to advance. Patient with good use of RW. Limited by pain and fatgiue.    Stairs            Wheelchair Mobility    Modified Rankin (Stroke Patients Only)       Balance    Sitting-balance support: No upper extremity supported;Feet supported Sitting balance-Leahy Scale: Fair Sitting balance - Comments: Paitent able to sit EOB about 10 mins while using R hand to brush her hair. No UE support for balance.      Standing balance-Leahy Scale: Zero                      Cognition Arousal/Alertness: Awake/alert Behavior During Therapy: WFL for tasks assessed/performed Overall Cognitive Status: History of cognitive impairments - at baseline                      Exercises      General Comments        Pertinent Vitals/Pain Faces Pain Scale: Hurts a little bit Pain Location: left side Pain Descriptors / Indicators: Grimacing Pain Intervention(s): Repositioned;Limited activity within patient's tolerance    Home Living                      Prior Function            PT Goals (current goals can now be found in the care plan section) Progress towards PT goals: Progressing toward goals    Frequency  Min 3X/week    PT Plan Current plan remains appropriate    Co-evaluation             End of Session   Activity Tolerance: Patient tolerated treatment well Patient left: in chair;with call bell/phone within reach;with  family/visitor present     Time: 0951-1020 PT Time Calculation (min) (ACUTE ONLY): 29 min  Charges:  $Gait Training: 8-22 mins $Therapeutic Activity: 8-22 mins                    G Codes:      Jacqualyn Posey 10/20/2014, 10:50 AM  10/20/2014 Jacqualyn Posey PTA [phone] pager [phone] office

## 2014-10-21 ENCOUNTER — Inpatient Hospital Stay (HOSPITAL_COMMUNITY): Payer: Medicare Other

## 2014-10-21 NOTE — Progress Notes (Signed)
Patient ID: Kathleen Haynes, female   DOB: 05-31-1927, 81 y.o.   MRN: [id number]   LOS: 6 days   Subjective: No new c/o.   Objective: Vital signs in last 24 hours: Temp:  [97.5 F (36.4 C)-98.1 F (36.7 C)] 97.5 F (36.4 C) (02/24 0600) Pulse Rate:  [99-110] 99 (02/24 0600) Resp:  [18-22] 22 (02/24 0600) BP: (109-119)/(60-73) 119/62 mmHg (02/24 0600) SpO2:  [88 %-100 %] 94 % (02/24 0600) Weight:  [96 lb 12.8 oz (43.908 kg)] 96 lb 12.8 oz (43.908 kg) (02/23 1825) Last BM Date:  (PTA)   CT No air leak 45ml/24h @200ml    Radiology Results PORTABLE CHEST - 1 VIEW  COMPARISON: 10/20/2014  FINDINGS: The left chest tube has withdrawn slightly in the interval from the prior exam. Reduction in the left pneumothorax is noted with only a small residual apical pneumothorax noted. No focal infiltrate or sizable effusion is seen. Subcutaneous emphysema is noted along the left chest wall. The known left rib fractures are again seen.  IMPRESSION: Reduction in left pneumothorax. The left chest tube has been withdrawn slightly in the interval from the prior exam.   Electronically Signed  By: Inez Catalina M.D.  On: 10/21/2014 08:04   Physical Exam General appearance: alert and no distress Resp: clear to auscultation bilaterally Cardio: regular rate and rhythm GI: normal findings: bowel sounds normal and soft, non-tender   Assessment/Plan: Fall Multiple L rib FXs with HPTX s/p CT - Continue suction today with incomplete resolution of PTX Dementia baseline  Chronic severe malnutrition due to chronic illness Parkinson's - Sinemet FEN - diet plus Ensure VTE - SCD's, Lovenox DIspo - SNF once CT out    Lisette Abu, PA-C Pager: [phone] General Trauma PA Pager: [phone]  10/21/2014

## 2014-10-21 NOTE — Clinical Social Work Note (Signed)
Clinical Social Worker continuing to follow patient and family for support and discharge planning needs.  CSW spoke with patient husband and daughter at bedside to answer questions.  Patient family expressed possible interest in having patient return home with home health - CSW explained the process as well as provide continued encouragement for current discharge recommendations from PT.  Patient family feels that patient mental status could possibly decline in a facility setting.  Patient family to continue to discuss options with one another and look at [facility] and [facility] for potential placement options.  CSW remains available for support and to facilitate patient discharge needs once medically stable.  Barbette Or, [place]

## 2014-10-21 NOTE — Progress Notes (Signed)
Physical Therapy Treatment Patient Details Name: Kathleen Haynes MRN: [id number] DOB: Mar 11, 1927 Today's Date: 10/21/2014    History of Present Illness pt presents after multiple falls resulting in L 5-10 rib fxs.  pt with hx of Dementia and Parkinson's.      PT Comments    Patient agreeable to out of bed to recliner with some encouragement. Having increased pain this session. Family present today and asking questions regarding discharge options. Notified CSW that family would like to speak with them. Continue to recommend SNF for ongoing Physical Therapy.     Follow Up Recommendations  SNF     Equipment Recommendations  None recommended by PT    Recommendations for Other Services       Precautions / Restrictions Precautions Precautions: Fall Restrictions Weight Bearing Restrictions: No    Mobility  Bed Mobility Overal bed mobility: Needs Assistance;+2 for physical assistance       Supine to sit: Mod assist;+2 for physical assistance     General bed mobility comments: Patient assisting some with mobility but use of pad to "helicoptor" pateint to EOB  Transfers Overall transfer level: Needs assistance Equipment used: 2 person hand held assist Transfers: Stand Pivot Transfers Sit to Stand: Max assist;+2 physical assistance Stand pivot transfers: +2 physical assistance;Max assist       General transfer comment: cues and facilitation for UE use, blocking Bil feet, andterior wt shifting over BOS.  pt with strong posterior lean.    Ambulation/Gait   Ambulation Distance (Feet): 5 Feet         General Gait Details: Unable this session due to pain   Stairs            Wheelchair Mobility    Modified Rankin (Stroke Patients Only)       Balance                                    Cognition Arousal/Alertness: Awake/alert Behavior During Therapy: WFL for tasks assessed/performed Overall Cognitive Status: History of cognitive  impairments - at baseline                      Exercises      General Comments        Pertinent Vitals/Pain Faces Pain Scale: Hurts even more Pain Location: left side Pain Descriptors / Indicators: Grimacing Pain Intervention(s): Limited activity within patient's tolerance;Monitored during session    Home Living                      Prior Function            PT Goals (current goals can now be found in the care plan section) Progress towards PT goals: Progressing toward goals    Frequency  Min 3X/week    PT Plan Current plan remains appropriate    Co-evaluation             End of Session   Activity Tolerance: Patient tolerated treatment well;Patient limited by pain Patient left: in chair;with call bell/phone within reach;with family/visitor present     Time: 4967-5916 PT Time Calculation (min) (ACUTE ONLY): 25 min  Charges:  $Therapeutic Activity: 23-37 mins                    G Codes:      Jacqualyn Posey 10/21/2014, 1:00 PM  10/21/2014 Felipa Eth  [place] pager [phone] office

## 2014-10-22 ENCOUNTER — Inpatient Hospital Stay (HOSPITAL_COMMUNITY): Payer: Medicare Other

## 2014-10-22 DIAGNOSIS — S270XXA Traumatic pneumothorax, initial encounter: Secondary | ICD-10-CM | POA: Diagnosis present

## 2014-10-22 DIAGNOSIS — W19XXXA Unspecified fall, initial encounter: Secondary | ICD-10-CM | POA: Diagnosis present

## 2014-10-22 LAB — CBC
HEMATOCRIT: 37.7 % (ref 36.0–46.0)
HEMOGLOBIN: 12.6 g/dL (ref 12.0–15.0)
MCH: 31 pg (ref 26.0–34.0)
MCHC: 33.4 g/dL (ref 30.0–36.0)
MCV: 92.6 fL (ref 78.0–100.0)
Platelets: 289 10*3/uL (ref 150–400)
RBC: 4.07 MIL/uL (ref 3.87–5.11)
RDW: 13 % (ref 11.5–15.5)
WBC: 8.4 10*3/uL (ref 4.0–10.5)

## 2014-10-22 MED ORDER — NAPROXEN 500 MG PO TABS: 500.0000 mg | ORAL_TABLET | Freq: Two times a day (BID) | ORAL | Status: DC

## 2014-10-22 NOTE — Progress Notes (Signed)
Discharge instructions reviewed with patient/family. All questions answered at this time. Transport by family.  Ave Filter, RN

## 2014-10-22 NOTE — Progress Notes (Signed)
Patient ID: Kathleen Haynes, female   DOB: [birth year], 81 y.o.   MRN: [id number]   LOS: 7 days   Subjective: Wants to get back in bed.   Objective: Vital signs in last 24 hours: Temp:  [97.7 F (36.5 C)-98.8 F (37.1 C)] 97.7 F (36.5 C) (02/25 0836) Pulse Rate:  [65-101] 100 (02/25 0836) Resp:  [16-20] 16 (02/25 0836) BP: (104-138)/(53-88) 110/77 mmHg (02/25 0836) SpO2:  [95 %-100 %] 97 % (02/25 0836) Last BM Date: 10/21/14   CT No air leak No OP @200ml    Radiology Results PORTABLE CHEST - 1 VIEW  COMPARISON: Portable chest x-ray of 10/21/2014  FINDINGS: Only a tiny left apical pneumothorax remains with left chest tube present. Vague opacity at the left lung base is unchanged as is left chest wall subcutaneous air. The right lung is clear. Heart size is stable.  IMPRESSION: Tiny residual left apical pneumothorax with left chest tube remaining.   Electronically Signed  By: Ivar Drape M.D.  On: 10/22/2014 08:28   Physical Exam General appearance: alert and no distress Resp: clear to auscultation bilaterally Cardio: regular rate and rhythm GI: normal findings: bowel sounds normal and soft, non-tender   Assessment/Plan: Fall Multiple L rib FXs with HPTX s/p CT - D/C CT Dementia baseline  Chronic severe malnutrition due to chronic illness Parkinson's - Sinemet FEN - diet plus Ensure VTE - SCD's, Lovenox DIspo - Family deciding on SNF vs home, may be able to go this afternoon    Lisette Abu, PA-C Pager: [phone] General Trauma PA Pager: [phone]  10/22/2014

## 2014-10-22 NOTE — Care Management Note (Signed)
  Page 2 of 2   10/22/2014     1:57:12 PM CARE MANAGEMENT NOTE 10/22/2014  Patient:  Kathleen Haynes, Kathleen Haynes   Account Number:  [id number]  Date Initiated:  10/19/2014  Documentation initiated by:  Sandi Mariscal  Subjective/Objective Assessment:   fall w/ mult rib fx and PTX requiring CT     Action/Plan:   CT to water seal 2/22; therapy evals recommending SNF at d/c   Anticipated DC Date:  10/22/2014   Anticipated DC Plan:  Kathleen Haynes  In-house referral  Clinical Social Worker         Choice offered to / List presented to:  C-4 Adult Children   DME arranged  HOSPITAL BED      DME agency  [place] arranged  HH-1 RN  Kathleen Haynes      [place].   Status of service:  In process, will continue to follow Medicare Important Message given?  YES (If response is "NO", the following Medicare IM given date fields will be blank) Date Medicare IM given:  10/19/2014 Medicare IM given by:  Sandi Mariscal Date Additional Medicare IM given:  10/22/2014 Additional Medicare IM given by:  Magdalen Spatz  Discharge Disposition:  [place]  Per UR Regulation:  Reviewed for med. necessity/level of care/duration of stay  If discussed at [place] of Stay Meetings, dates discussed:   10/22/2014    Comments:  10-22-14 Discussed discharge planning with daughter Kathleen Haynes [phone] at bedside. Patient's husband Kathleen Haynes [phone] ) plan on taking patient home with home health at discharge . Requesting hospital bed .  Kathleen Haynes states that she and her father will be with patient at all times  at home and they have a friend willing to assist also. Provided Kathleen Haynes with a list of private duty agencies also.  Magdalen Spatz RN BSN [phone]

## 2014-10-22 NOTE — Discharge Summary (Signed)
Physician Discharge Summary  Patient ID: Kathleen Haynes MRN: [id number] DOB/AGE: 1927/06/11 81 y.o.  Admit date: 10/15/2014 Discharge date: 10/22/2014  Discharge Diagnoses Patient Active Problem List   Diagnosis Date Noted  . Fall 10/22/2014  . Traumatic pneumothorax 10/22/2014  . Protein-calorie malnutrition, severe 10/17/2014  . Multiple fractures of ribs of left side 10/15/2014  . Parkinson's disease 11/27/2012  . Unspecified sleep apnea 11/27/2012  . RBD (REM behavioral disorder) 11/27/2012  . Dementia due to Parkinson's disease without behavioral disturbance 11/27/2012  . Hypersomnia with sleep apnea, unspecified 11/27/2012    Consultants Dr. Roberts Gaudy for anesthesiology   Procedures 2/20 -- Left tube thoracostomy by Dr. Donnie Mesa   HPI: Kathleen Haynes was as home and fell in the bathroom. Her family denied syncope but says she's had multiple recent falls secondary to weakness. They attribute this to anorexia. She has a diagnosis of Parkinson's but refuses to use a walker at home. Her workup included CT scans of the head, cervical spine, abdomen, and pelvis as well as a chest x-ray and showed the multiple left rib fractures and small pneumothorax. She was admitted for pain control and pulmonary toilet. Anesthesia was consulted for consideration of epidural catheter placement.   Hospital Course: After speaking with family anesthesia declined to insert a catheter. She was mobilized with physical and occupational therapy who recommended skilled nursing facility placement. By 2/20 her pneumothorax had enlarged significantly and required placement of a chest tube. This was weaned to water seal but increased back to 20cm due to recurrence of a small pneumothorax. A chest x-ray the following day was improved and the tube was removed on suction. Another chest x-ray later that day showed resolution of the pneumothorax. Her family decided they would rather care for the patient at home and  she was discharged home with them in stable condition.     Medication List    TAKE these medications        atenolol-chlorthalidone 50-25 MG per tablet  Commonly known as:  TENORETIC  Take 1 tablet by mouth daily.     carbidopa-levodopa 25-100 MG per tablet  Commonly known as:  SINEMET IR  Take 1 tablet by mouth 2 (two) times daily.     DETROL LA 4 MG 24 hr capsule  Generic drug:  tolterodine  Take 4 mg by mouth daily.     donepezil 5 MG tablet  Commonly known as:  ARICEPT  Take 1 tablet (5 mg total) by mouth at bedtime.     Linaclotide 145 MCG Caps capsule  Commonly known as:  LINZESS  Take 1 capsule (145 mcg total) by mouth daily.     memantine 14 MG Cp24 24 hr capsule  Commonly known as:  NAMENDA XR  Take 1 capsule (14 mg total) by mouth daily.     naproxen 500 MG tablet  Commonly known as:  NAPROSYN  Take 1 tablet (500 mg total) by mouth 2 (two) times daily with a meal.     potassium chloride SA 20 MEQ tablet  Commonly known as:  K-DUR,KLOR-CON  Take 20 mEq by mouth daily.     pravastatin 80 MG tablet  Commonly known as:  PRAVACHOL  Take 80 mg by mouth daily.     traMADol 50 MG tablet  Commonly known as:  ULTRAM  Take 50 mg by mouth every 6 (six) hours as needed for moderate pain.            Follow-up Information  Follow up with [facility] On 10/28/2014.   Why:  2:15PM   Contact information:   [place] [address] [phone]      Follow up with Dwan Bolt, MD.   Specialty:  Endocrinology   Contact information:   [address] [place] [address] [phone]        Signed: Lisette Abu, PA-C Pager: [phone] General Trauma PA Pager: [phone] 10/22/2014, 2:17 PM

## 2014-10-25 DIAGNOSIS — E43 Unspecified severe protein-calorie malnutrition: Secondary | ICD-10-CM | POA: Diagnosis not present

## 2014-10-25 DIAGNOSIS — S2242XD Multiple fractures of ribs, left side, subsequent encounter for fracture with routine healing: Secondary | ICD-10-CM | POA: Diagnosis not present

## 2014-10-25 DIAGNOSIS — G20 Parkinson's disease: Secondary | ICD-10-CM | POA: Diagnosis not present

## 2014-10-25 DIAGNOSIS — G4733 Obstructive sleep apnea (adult) (pediatric): Secondary | ICD-10-CM | POA: Diagnosis not present

## 2014-10-25 DIAGNOSIS — F0390 Unspecified dementia without behavioral disturbance: Secondary | ICD-10-CM | POA: Diagnosis not present

## 2014-10-26 DIAGNOSIS — S2242XD Multiple fractures of ribs, left side, subsequent encounter for fracture with routine healing: Secondary | ICD-10-CM | POA: Diagnosis not present

## 2014-10-26 DIAGNOSIS — E43 Unspecified severe protein-calorie malnutrition: Secondary | ICD-10-CM | POA: Diagnosis not present

## 2014-10-26 DIAGNOSIS — G20 Parkinson's disease: Secondary | ICD-10-CM | POA: Diagnosis not present

## 2014-10-26 DIAGNOSIS — F0390 Unspecified dementia without behavioral disturbance: Secondary | ICD-10-CM | POA: Diagnosis not present

## 2014-10-26 DIAGNOSIS — G4733 Obstructive sleep apnea (adult) (pediatric): Secondary | ICD-10-CM | POA: Diagnosis not present

## 2014-10-27 DIAGNOSIS — G20 Parkinson's disease: Secondary | ICD-10-CM | POA: Diagnosis not present

## 2014-10-27 DIAGNOSIS — F0390 Unspecified dementia without behavioral disturbance: Secondary | ICD-10-CM | POA: Diagnosis not present

## 2014-10-27 DIAGNOSIS — S2242XD Multiple fractures of ribs, left side, subsequent encounter for fracture with routine healing: Secondary | ICD-10-CM | POA: Diagnosis not present

## 2014-10-27 DIAGNOSIS — G4733 Obstructive sleep apnea (adult) (pediatric): Secondary | ICD-10-CM | POA: Diagnosis not present

## 2014-10-27 DIAGNOSIS — E43 Unspecified severe protein-calorie malnutrition: Secondary | ICD-10-CM | POA: Diagnosis not present

## 2014-10-28 DIAGNOSIS — S2232XD Fracture of one rib, left side, subsequent encounter for fracture with routine healing: Secondary | ICD-10-CM | POA: Diagnosis not present

## 2014-10-28 DIAGNOSIS — S270XXD Traumatic pneumothorax, subsequent encounter: Secondary | ICD-10-CM | POA: Diagnosis not present

## 2014-10-29 DIAGNOSIS — F0390 Unspecified dementia without behavioral disturbance: Secondary | ICD-10-CM | POA: Diagnosis not present

## 2014-10-29 DIAGNOSIS — E43 Unspecified severe protein-calorie malnutrition: Secondary | ICD-10-CM | POA: Diagnosis not present

## 2014-10-29 DIAGNOSIS — S2242XD Multiple fractures of ribs, left side, subsequent encounter for fracture with routine healing: Secondary | ICD-10-CM | POA: Diagnosis not present

## 2014-10-29 DIAGNOSIS — G4733 Obstructive sleep apnea (adult) (pediatric): Secondary | ICD-10-CM | POA: Diagnosis not present

## 2014-10-29 DIAGNOSIS — G20 Parkinson's disease: Secondary | ICD-10-CM | POA: Diagnosis not present

## 2014-10-30 DIAGNOSIS — F0390 Unspecified dementia without behavioral disturbance: Secondary | ICD-10-CM | POA: Diagnosis not present

## 2014-10-30 DIAGNOSIS — G20 Parkinson's disease: Secondary | ICD-10-CM | POA: Diagnosis not present

## 2014-10-30 DIAGNOSIS — S2242XD Multiple fractures of ribs, left side, subsequent encounter for fracture with routine healing: Secondary | ICD-10-CM | POA: Diagnosis not present

## 2014-10-30 DIAGNOSIS — G4733 Obstructive sleep apnea (adult) (pediatric): Secondary | ICD-10-CM | POA: Diagnosis not present

## 2014-10-30 DIAGNOSIS — E43 Unspecified severe protein-calorie malnutrition: Secondary | ICD-10-CM | POA: Diagnosis not present

## 2014-11-02 DIAGNOSIS — F0390 Unspecified dementia without behavioral disturbance: Secondary | ICD-10-CM | POA: Diagnosis not present

## 2014-11-02 DIAGNOSIS — S2242XD Multiple fractures of ribs, left side, subsequent encounter for fracture with routine healing: Secondary | ICD-10-CM | POA: Diagnosis not present

## 2014-11-02 DIAGNOSIS — E43 Unspecified severe protein-calorie malnutrition: Secondary | ICD-10-CM | POA: Diagnosis not present

## 2014-11-02 DIAGNOSIS — G20 Parkinson's disease: Secondary | ICD-10-CM | POA: Diagnosis not present

## 2014-11-02 DIAGNOSIS — G4733 Obstructive sleep apnea (adult) (pediatric): Secondary | ICD-10-CM | POA: Diagnosis not present

## 2014-11-03 DIAGNOSIS — G20 Parkinson's disease: Secondary | ICD-10-CM | POA: Diagnosis not present

## 2014-11-03 DIAGNOSIS — F0390 Unspecified dementia without behavioral disturbance: Secondary | ICD-10-CM | POA: Diagnosis not present

## 2014-11-03 DIAGNOSIS — S2242XD Multiple fractures of ribs, left side, subsequent encounter for fracture with routine healing: Secondary | ICD-10-CM | POA: Diagnosis not present

## 2014-11-03 DIAGNOSIS — E43 Unspecified severe protein-calorie malnutrition: Secondary | ICD-10-CM | POA: Diagnosis not present

## 2014-11-03 DIAGNOSIS — G4733 Obstructive sleep apnea (adult) (pediatric): Secondary | ICD-10-CM | POA: Diagnosis not present

## 2014-11-04 DIAGNOSIS — G4733 Obstructive sleep apnea (adult) (pediatric): Secondary | ICD-10-CM | POA: Diagnosis not present

## 2014-11-04 DIAGNOSIS — G20 Parkinson's disease: Secondary | ICD-10-CM | POA: Diagnosis not present

## 2014-11-04 DIAGNOSIS — E43 Unspecified severe protein-calorie malnutrition: Secondary | ICD-10-CM | POA: Diagnosis not present

## 2014-11-04 DIAGNOSIS — S2242XD Multiple fractures of ribs, left side, subsequent encounter for fracture with routine healing: Secondary | ICD-10-CM | POA: Diagnosis not present

## 2014-11-04 DIAGNOSIS — F0390 Unspecified dementia without behavioral disturbance: Secondary | ICD-10-CM | POA: Diagnosis not present

## 2014-11-06 DIAGNOSIS — S2242XD Multiple fractures of ribs, left side, subsequent encounter for fracture with routine healing: Secondary | ICD-10-CM | POA: Diagnosis not present

## 2014-11-06 DIAGNOSIS — F0390 Unspecified dementia without behavioral disturbance: Secondary | ICD-10-CM | POA: Diagnosis not present

## 2014-11-06 DIAGNOSIS — G4733 Obstructive sleep apnea (adult) (pediatric): Secondary | ICD-10-CM | POA: Diagnosis not present

## 2014-11-06 DIAGNOSIS — E43 Unspecified severe protein-calorie malnutrition: Secondary | ICD-10-CM | POA: Diagnosis not present

## 2014-11-06 DIAGNOSIS — G20 Parkinson's disease: Secondary | ICD-10-CM | POA: Diagnosis not present

## 2014-11-09 DIAGNOSIS — F0390 Unspecified dementia without behavioral disturbance: Secondary | ICD-10-CM | POA: Diagnosis not present

## 2014-11-09 DIAGNOSIS — G4733 Obstructive sleep apnea (adult) (pediatric): Secondary | ICD-10-CM | POA: Diagnosis not present

## 2014-11-09 DIAGNOSIS — G20 Parkinson's disease: Secondary | ICD-10-CM | POA: Diagnosis not present

## 2014-11-09 DIAGNOSIS — E43 Unspecified severe protein-calorie malnutrition: Secondary | ICD-10-CM | POA: Diagnosis not present

## 2014-11-09 DIAGNOSIS — S2242XD Multiple fractures of ribs, left side, subsequent encounter for fracture with routine healing: Secondary | ICD-10-CM | POA: Diagnosis not present

## 2014-11-11 DIAGNOSIS — G20 Parkinson's disease: Secondary | ICD-10-CM | POA: Diagnosis not present

## 2014-11-11 DIAGNOSIS — S2242XD Multiple fractures of ribs, left side, subsequent encounter for fracture with routine healing: Secondary | ICD-10-CM | POA: Diagnosis not present

## 2014-11-11 DIAGNOSIS — E43 Unspecified severe protein-calorie malnutrition: Secondary | ICD-10-CM | POA: Diagnosis not present

## 2014-11-11 DIAGNOSIS — G4733 Obstructive sleep apnea (adult) (pediatric): Secondary | ICD-10-CM | POA: Diagnosis not present

## 2014-11-11 DIAGNOSIS — F0390 Unspecified dementia without behavioral disturbance: Secondary | ICD-10-CM | POA: Diagnosis not present

## 2014-11-12 DIAGNOSIS — F0390 Unspecified dementia without behavioral disturbance: Secondary | ICD-10-CM | POA: Diagnosis not present

## 2014-11-12 DIAGNOSIS — S2242XD Multiple fractures of ribs, left side, subsequent encounter for fracture with routine healing: Secondary | ICD-10-CM | POA: Diagnosis not present

## 2014-11-12 DIAGNOSIS — E43 Unspecified severe protein-calorie malnutrition: Secondary | ICD-10-CM | POA: Diagnosis not present

## 2014-11-12 DIAGNOSIS — G4733 Obstructive sleep apnea (adult) (pediatric): Secondary | ICD-10-CM | POA: Diagnosis not present

## 2014-11-12 DIAGNOSIS — G20 Parkinson's disease: Secondary | ICD-10-CM | POA: Diagnosis not present

## 2015-01-15 ENCOUNTER — Encounter: Payer: Self-pay | Admitting: Neurology

## 2015-01-15 ENCOUNTER — Ambulatory Visit (INDEPENDENT_AMBULATORY_CARE_PROVIDER_SITE_OTHER): Payer: Medicare Other | Admitting: Neurology

## 2015-01-15 VITALS — BP 108/60 | HR 68 | Resp 14

## 2015-01-15 DIAGNOSIS — G4752 REM sleep behavior disorder: Secondary | ICD-10-CM

## 2015-01-15 DIAGNOSIS — W19XXXA Unspecified fall, initial encounter: Secondary | ICD-10-CM | POA: Diagnosis not present

## 2015-01-15 DIAGNOSIS — G20 Parkinson's disease: Secondary | ICD-10-CM

## 2015-01-15 DIAGNOSIS — S272XXS Traumatic hemopneumothorax, sequela: Secondary | ICD-10-CM | POA: Diagnosis not present

## 2015-01-15 DIAGNOSIS — S2231XS Fracture of one rib, right side, sequela: Secondary | ICD-10-CM

## 2015-01-15 DIAGNOSIS — T1490 Injury, unspecified: Secondary | ICD-10-CM | POA: Diagnosis not present

## 2015-01-15 DIAGNOSIS — F0280 Dementia in other diseases classified elsewhere without behavioral disturbance: Secondary | ICD-10-CM | POA: Diagnosis not present

## 2015-01-15 NOTE — Patient Instructions (Signed)
We will continue with your medications.   Do not walk without a walker AND with assistance. You have to use either your wheelchair or your walker with assistance. Do not stand or transfer or walk by yourself or by holding onto things, you are no longer safe to do that!  Drink more water!

## 2015-01-15 NOTE — Progress Notes (Signed)
Subjective:    Kathleen Haynes ID: Kathleen Haynes is a 81 y.o. female.  HPI     Interim history:   Kathleen Haynes is a very pleasant 81 year old right-handed woman with an underlying medical history of hyperlipidemia, hypertension, OSA, chronic back pain, and C2 fracture from a fall, who presents for followup consultation of Kathleen Haynes left-sided predominant Parkinson's disease, complicated by severe hearing loss, urinary incontinence, chronic constipation, recurrent falls, memory loss, RBD, hallucinations, motor fluctuations, motor complications, and complex sleep apnea with CPAP intolerance. She is accompanied by Kathleen Haynes husband again today. I last saw Kathleen Haynes on 09/15/2014, at which time she reported improved constipation. She was advised to drink more water and less Pepsi. She was having vivid dreams. She was talking in Kathleen Haynes sleep. She had no significant issues with REM behavior disorder. She was using Kathleen Haynes walker or Kathleen Haynes wheelchair is a walker and sometimes was holding onto things at home. Kathleen Haynes memory was stable. She was on Aricept 5 mg and Namenda long-acting 14 mg daily. She was on Sinemet twice daily. She was on Linzess once daily in the morning on an empty stomach. She was having ongoing visual hallucinations but they were stable. Their daughter lives next door and their son lives in the area as well. I suggested we continue with the same medication regimen.  Unfortunately, in the interim, she fell at home in the bathroom and injured herself. She was hospitalized in February. I reviewed the hospital records including discharge summary on test results. She was admitted on 10/15/2014 and discharged on 10/22/2014. She sustained a traumatic pneumothorax and rib fractures. She required left tube thoracostomy on 10/17/2014. Workup also included CT head and cervical spine without contrast on 10/15/2014: 1. No signs of significant acute traumatic injury to the skull, brain or cervical spine. 2. Chronic type 2 odontoid fracture  redemonstrated with progressive degenerative changes at C1 and C2, as described above. In addition, there is multilevel degenerative disc disease and cervical spondylosis throughout other portions of the cervical spine as well. 3. 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 I personally reviewed the images through the PACS system and agree with the findings.  CT abdomen and pelvis with and without contrast on 10/15/2014 showed: Multiple left-sided segmental rib fractures with moderate-sized left pneumothorax. No acute intra-abdominal or pelvic pathology.  She was discharged to home with home health services.   Today, 01/15/2015: She is not able to provide any of Kathleen Haynes history. She is extremely hard of hearing. She does answer some of the questions with simple yes or no. Kathleen Haynes husband provides essentially Kathleen Haynes entire history. He reports that she is no longer in any residual pain. She has done well. Home health services finished their visits. His daughter stays with him during the day to help take care of Kathleen Haynes. At night she does not have a tendency to get out of bed. She does talk in Kathleen Haynes sleep. Kathleen Haynes main issue is that she does not always listen to advice. She has still a tendency to sometimes get up and hold onto things for support. She does not wait for help all the time and she does not always ask for help. She is more and more in Kathleen Haynes wheelchair. She also has a walker which she uses with assistance only.  Previously:   I saw Kathleen Haynes on 03/12/2014, at which time Kathleen Haynes husband reported that she  does not like to wear Kathleen Haynes soft neck collar. She was using the wheelchair for a walker. He reported no recent falls. She was no longer on Aricept. She was having REM behavior disorder and nearly daily hallucinations. Kathleen Haynes memory was deemed stable per him. She was on Namenda  long-acting 14 mg once daily, carbidopa levodopa 1 pill twice daily, and I restarted Kathleen Haynes on Aricept 5 mg once daily. In the interim, she was seen by our nurse practitioner, Ms. Lam, on 06/16/2014, at which time she was continued on the current medication regimen and she was on Linzess for chronic constipation.  I saw Kathleen Haynes on 10/20/13, at which time I talked to them at length about Kathleen Haynes complex situation and limitation in what we can do with medical management. She had been on low-dose levodopa and needed adjustment in the past for hallucinations. We talked about Kathleen Haynes fall risk and she was essentially wheelchair dependent at the time. I did not make any changes to Kathleen Haynes medications with the exception of changing Kathleen Haynes to long-acting Namenda once daily 14 mg.  She no longer sees Dr. Vertell Limber in [place]. Memory loss is stable. She has been tolerating the Namenda XR 14 mg. She is on C/L 1 pill twice daily. He could split the pills without crushing. He gives Kathleen Haynes Ensure as well. Their daughter helps out and their son lives a mile away and the 3 GC come and visit.   I first met Kathleen Haynes on 04/18/2013, which time I talked to Kathleen Haynes husband a long time about the complexity of Kathleen Haynes issues. I was reluctant to increase Kathleen Haynes levodopa for fear of side effects including exacerbation of Kathleen Haynes hallucinations.   She previously followed with Dr. Morene Antu and was last seen by Dr. Erling Cruz on 09/20/2012, at which time he started donepezil for dementia and did some blood work. He decreased Kathleen Haynes Sinemet to one tablet, alternating with half a tablet 4 times a day for a total of 300 mg of levodopa to help with dyskinesias and hallucinations. He did not start Kathleen Haynes on amantadine because of Kathleen Haynes dementia. She was also advised to start CPAP for obstructive sleep apnea. She has been on donepezil 5 mg daily, tramadol, meloxicam, multivitamin, stool softener, Detrol LA, pilocarpine, pravastatin, atenolol-chlorthalidone, Klor-Con, Sinemet 1 tablet 8, half a tablet at noon,  1 tablet at 4 PM and half a tablet at bedtime. She had B12, TSH, RPR tested in January 2014, which were normal.   She was diagnosed with left-sided Parkinson's disease in 1999 and started on Sinemet at the time. She started having dyskinesias and started using a rolling walker with a seat and a wheelchair. She has had recurrent falls and freezing spells and festination and did not improve with selegiline which was stopped. She developed constipation and urinary incontinence and does not exercise regularly. She had hallucinations and delusions. She fell and hit Kathleen Haynes head in May 2012 and was seen in the emergency room. She was found to have a C2 vertebral fracture and CT head without contrast showed generalized atrophy. She had surgery by Dr. Vertell Limber in July 2012 and August 2012 she started having memory loss in 2012. In December 2012 Kathleen Haynes MMSE was 24, clock drawing was 4, and normal fluency was 9. She does not sleep well at night and takes multiple naps during the day. She was diagnosed with complex sleep apnea in March 2012 without REM onset behavior disorder. She had a repeat sleep study in April 2012 for CPAP titration but  then never started CPAP. She had frequent PLMS. She takes meloxicam and tramadol for Kathleen Haynes residual head and neck pain.   She saw Dr. Brett Fairy in April 2014, and was encouraged to use Kathleen Haynes CPAP. Dr. Brett Fairy reviewed Kathleen Haynes sleep test results and compliance data with the Kathleen Haynes and Kathleen Haynes husband at the time.   Kathleen Haynes high cervical fracture was treated with a brace. She has a good support system with daughter living next door. She has hallucinations especially at night. She has not been using Kathleen Haynes CPAP and it was taken back. She continually pulled off the mask in the middle of the night and the husband would wake up with a leaking air noise. He has tried but has not been able to make Kathleen Haynes use the CPAP. She used it 19/30 days and mostly for 1 to 2 hours at a time and she still had a high residual AHI. She has  been on Namenda and Aricept. She has dyskinesias on the LLE. Of note, she has severe hearing loss and threw away 2 pairs of hearing aids.    Kathleen Haynes Past Medical History Is Significant For: Past Medical History  Diagnosis Date  . Parkinson disease   . High cholesterol     since 2000  . Hypertension     since 1985  . Back pain     lower back pain , C2 fracture from fall  . Obstructive apnea   . Dementia     Kathleen Haynes Past Surgical History Is Significant For: Past Surgical History  Procedure Laterality Date  . Shoulder surgery Left     [id number]  . Lumbar spine surgery    . Cervical spine surgery      02/2011, 03/2011  . Rotary cuff Right     Kathleen Haynes Family History Is Significant For: Family History  Problem Relation Age of Onset  . Cancer Mother   . Congestive Heart Failure Father   . Diabetes Other   . Hypertension Other   . Hyperlipidemia Other   . Heart disease Other     Kathleen Haynes Social History Is Significant For: History   Social History  . Marital Status: Married    Spouse Name: [name]  . Number of Children: 2  . Years of Education: HS   Occupational History  .     Social History Main Topics  . Smoking status: Never Smoker   . Smokeless tobacco: Never Used  . Alcohol Use: No  . Drug Use: No  . Sexual Activity: Not on file   Other Topics Concern  . None   Social History Narrative   Kathleen Haynes lives at home with spouse.   Caffeine Use: 2 cups daily    Kathleen Haynes Allergies Are:  No Known Allergies:   Kathleen Haynes Current Medications Are:  Outpatient Encounter Prescriptions as of 01/15/2015  Medication Sig  . atenolol-chlorthalidone (TENORETIC) 50-25 MG per tablet Take 1 tablet by mouth daily.  . carbidopa-levodopa (SINEMET IR) 25-100 MG per tablet Take 1 tablet by mouth 2 (two) times daily.  Marland Kitchen donepezil (ARICEPT) 5 MG tablet Take 1 tablet (5 mg total) by mouth at bedtime.  . Linaclotide (LINZESS) 145 MCG CAPS capsule Take 1 capsule (145 mcg total) by mouth daily.  . memantine  (NAMENDA XR) 14 MG CP24 24 hr capsule Take 1 capsule (14 mg total) by mouth daily.  . naproxen (NAPROSYN) 500 MG tablet Take 1 tablet (500 mg total) by mouth 2 (two) times daily with a meal.  . potassium chloride SA (  K-DUR,KLOR-CON) 20 MEQ tablet Take 20 mEq by mouth daily.  . pravastatin (PRAVACHOL) 80 MG tablet Take 80 mg by mouth daily.  Marland Kitchen tolterodine (DETROL LA) 4 MG 24 hr capsule Take 4 mg by mouth daily.  . traMADol (ULTRAM) 50 MG tablet Take 50 mg by mouth every 6 (six) hours as needed for moderate pain.    No facility-administered encounter medications on file as of 01/15/2015.  :  Review of Systems:  Out of a complete 14 point review of systems, all are reviewed and negative with the exception of these symptoms as listed below:   Review of Systems  Neurological:       According to husband Kathleen Haynes wants to talk all night  All other systems reviewed and are negative.   Objective:  Neurologic Exam  Physical Exam Physical Examination:   Filed Vitals:   01/15/15 1103  BP: 108/60  Pulse: 68  Resp: 14   General Examination: The Kathleen Haynes is a very pleasant 81 y.o. female in no acute distress. She is situated in Kathleen Haynes WC. She is frail appearing. She is extremely hard of hearing.   HEENT: Normocephalic, atraumatic, pupils are equal, round and reactive to light and accommodation. Extraocular tracking shows moderate saccadic breakdown without nystagmus noted. There is limitation to entire gaze. There is moderate decrease in eye blink rate. Hearing is severely impaired and she is not able to follow any commands by verbal cues, but will mimic. Face is symmetric with mild facial masking and normal facial sensation. There is no lip, neck or jaw tremor. Neck was not moved passively. Oropharynx exam reveals moderate mouth dryness, tongue protrudes midline. There is minimal pooling of saliva in the floor of Kathleen Haynes mouth.    Chest: is clear to auscultation without wheezing, rhonchi or crackles  noted.  Heart: sounds are regular and normal without murmurs, rubs or gallops noted.   Abdomen: is soft, non-tender and non-distended with normal bowel sounds appreciated on auscultation.  Extremities: There is no pitting edema in the distal lower extremities bilaterally. Pedal pulses are intact. There are no varicose veins.  Skin: is warm and dry with no trophic changes noted. Age-related changes are noted on the skin.   Musculoskeletal: exam reveals no obvious joint deformities, tenderness, joint swelling or erythema.  Neurologically:   Mental status: The Kathleen Haynes is awake and alert, paying poor attention. She is unable to provide the history. Kathleen Haynes husband provides the entire history. She is oriented to: person. Kathleen Haynes memory, attention, language and knowledge are impaired significantly. There is a moderate degree of bradyphrenia. Speech is very scant, mildly hypophonic with mild dysarthria noted. Mood is congruent and affect is blunted.   Cranial nerves are as described above under HEENT exam. In addition, shoulder shrug is normal with equal shoulder height noted.  Motor exam: thin bulk, and strength globally 4/5. There are mild intermittent dyskinesias noted. These are primarily noted in the Left Lower Limb and there is L foot inversion and dystonic posturing, all unchanged.  Tone is mildly rigid with absence of cogwheeling. There is overall moderate bradykinesia. There is no drift or rebound.  There is no tremor.   Romberg is not tested.  Reflexes are trace in the upper extremities and trace in the lower extremities.   Fine motor skills exam: Finger taps are moderately impaired on the right and moderately impaired on the left. Hand movements are moderately impaired on the right and moderately impaired on the left. RAP (rapid alternating patting) is moderately  impaired on the right and moderately impaired on the left. Foot taps are moderately impaired on the right and severely impaired on the  left. Foot agility (in the form of heel stomping) is moderately impaired on the right and severely impaired on the left.    Cerebellar testing shows no dysmetria or intention tremor on finger to nose testing.  Sensory exam is intact to light touch.   Gait, station and balance: I did not have Kathleen Haynes stand or walk for me today d/t fall risk, as she did not bring Kathleen Haynes walker. Kathleen Haynes balance was markedly impaired as per previous exams.   Assessment and Plan:   In summary, CONNA TERADA is a very pleasant 81 year old female who presents for followup consultation of Kathleen Haynes advanced, left-sided predominant Parkinson's disease, complicated by severe hearing loss, memory loss, RBD, chronic constipation, advancing age with frailty, motor fluctuations and motor complications, and complex sleep apnea with intolerance to CPAP in the past and recurrent falls with injuries. Most recently she fell at home in Kathleen Haynes bathroom as she was trying to come off of the toilet commode. She injured herself in Kathleen Haynes chest, she suffered hemopneumothorax and several rib fractures. Today, I reviewed Kathleen Haynes hospital records. I again had a long discussion with Kathleen Haynes husband explaining the complexity of Kathleen Haynes condition and there is a not a whole lot I can do differently for Kathleen Haynes. She is on low-dose Sinemet. She is on low-dose dual memory medication, and has typically not been getting up by herself but sometimes she does have a tendency to not use Kathleen Haynes walker or Kathleen Haynes wheelchair and get up by herself which she is not supposed to do. I suggested we continue with Kathleen Haynes medications. When he is in need for refill in the future, he would like to have 90 day prescriptions and use mail-order pharmacy. He did not need a refill today. He is again advised to make sure that she does not stand or walk or transfer by herself. She supposed to use Kathleen Haynes walker with assistance only, and she can use Kathleen Haynes wheelchair. He is again encouraged to make sure she drinks plenty of water. She  needed a dose reduction in the past of Kathleen Haynes Sinemet because of hallucinations and dyskinesias, therefore she has been on low-dose Sinemet since then. Kathleen Haynes biggest problem continues to be Kathleen Haynes fall risk and risk for significant injuries. Kathleen Haynes daughter spends the daytime with Kathleen Haynes for additional assistance of Kathleen Haynes husband. They have a son that lives close by and also helps out. I suggested a 3-4 month follow-up, sooner if needed. I answered all his questions today and the Kathleen Haynes's husband was in agreement. I encouraged him to call for any interim problems, concerns or refill requests.  I spent 20 minutes in total face-to-face time with the Kathleen Haynes, more than 50% of which was spent in counseling and coordination of care, reviewing test results, reviewing medication and discussing or reviewing the diagnosis of PD and dementia, the prognosis and treatment options.

## 2015-02-26 DIAGNOSIS — N3281 Overactive bladder: Secondary | ICD-10-CM | POA: Diagnosis not present

## 2015-02-26 DIAGNOSIS — E789 Disorder of lipoprotein metabolism, unspecified: Secondary | ICD-10-CM | POA: Diagnosis not present

## 2015-02-26 DIAGNOSIS — I10 Essential (primary) hypertension: Secondary | ICD-10-CM | POA: Diagnosis not present

## 2015-03-05 DIAGNOSIS — M542 Cervicalgia: Secondary | ICD-10-CM | POA: Diagnosis not present

## 2015-03-05 DIAGNOSIS — K5900 Constipation, unspecified: Secondary | ICD-10-CM | POA: Diagnosis not present

## 2015-03-05 DIAGNOSIS — I10 Essential (primary) hypertension: Secondary | ICD-10-CM | POA: Diagnosis not present

## 2015-05-19 ENCOUNTER — Ambulatory Visit (INDEPENDENT_AMBULATORY_CARE_PROVIDER_SITE_OTHER): Payer: Medicare Other | Admitting: Neurology

## 2015-05-19 ENCOUNTER — Encounter: Payer: Self-pay | Admitting: Neurology

## 2015-05-19 VITALS — BP 142/70 | HR 62 | Resp 14

## 2015-05-19 DIAGNOSIS — G4752 REM sleep behavior disorder: Secondary | ICD-10-CM

## 2015-05-19 DIAGNOSIS — R54 Age-related physical debility: Secondary | ICD-10-CM | POA: Diagnosis not present

## 2015-05-19 DIAGNOSIS — K5900 Constipation, unspecified: Secondary | ICD-10-CM

## 2015-05-19 DIAGNOSIS — R5381 Other malaise: Secondary | ICD-10-CM

## 2015-05-19 DIAGNOSIS — R443 Hallucinations, unspecified: Secondary | ICD-10-CM | POA: Diagnosis not present

## 2015-05-19 DIAGNOSIS — F0280 Dementia in other diseases classified elsewhere without behavioral disturbance: Secondary | ICD-10-CM | POA: Diagnosis not present

## 2015-05-19 DIAGNOSIS — Z9181 History of falling: Secondary | ICD-10-CM

## 2015-05-19 DIAGNOSIS — G20 Parkinson's disease: Secondary | ICD-10-CM | POA: Diagnosis not present

## 2015-05-19 DIAGNOSIS — R413 Other amnesia: Secondary | ICD-10-CM

## 2015-05-19 DIAGNOSIS — K5909 Other constipation: Secondary | ICD-10-CM

## 2015-05-19 MED ORDER — MEMANTINE HCL ER 14 MG PO CP24
14.0000 mg | ORAL_CAPSULE | Freq: Every day | ORAL | Status: DC
Start: 2015-05-19 — End: 2015-09-30

## 2015-05-19 MED ORDER — DONEPEZIL HCL 5 MG PO TABS: 5.0000 mg | ORAL_TABLET | Freq: Every day | ORAL | Status: DC

## 2015-05-19 MED ORDER — CARBIDOPA-LEVODOPA 25-100 MG PO TABS: 1.0000 | ORAL_TABLET | Freq: Two times a day (BID) | ORAL | Status: DC

## 2015-05-19 NOTE — Progress Notes (Signed)
Subjective:    Patient ID: Kathleen Haynes is a 81 y.o. female.  HPI     Interim history:   Kathleen Haynes is a very pleasant 81 year old right-handed woman with an underlying medical history of hyperlipidemia, hypertension, OSA, chronic back pain, and C2 fracture from a fall, who presents for followup consultation of her left-sided predominant Parkinson's disease, complicated by severe hearing loss, urinary incontinence, chronic constipation, recurrent falls, memory loss, RBD, hallucinations, frailty, advancing age, neck pain, s/p neck surgery under Kathleen Haynes, motor fluctuations, motor complications, and complex sleep apnea (Hx of CPAP intolerance). She is accompanied by her husband again today. I last saw her on 01/15/2015, at which time her husband provided her entire history. She had improvement in her pain. She had finished home health therapy. She was sleep talking. She was not always compliant with her walker and had a tendency to get up and hold onto things for support. She was not always asking for help or waiting for help. I suggested that we continue her memory medications and Sinemet at the current doses. She was reminded to drink more water and never to get up by herself and to use a walker only with additional assistance.  Today, 05/19/2015: she is not verbal today. Her history is provided solely by her husband. She seems to have residual severe neck pain. He has tried giving her tramadol up to 4 times a day but he does not know if it helps her as she would not say. She does complain about neck pain however. They have previously seen Kathleen Haynes but there was nothing else he could offer other than a soft collar which made her feel worse she felt and ultimately a halo, but this was not an option either. She takes Robaxin up to twice daily. She sleeps a lot during the day.  Previously:   I saw her on 09/15/2014, at which time she reported improved constipation. She was advised to drink more  water and less Pepsi. She was having vivid dreams. She was talking in her sleep. She had no significant issues with REM behavior disorder. She was using her walker or her wheelchair is a walker and sometimes was holding onto things at home. Her memory was stable. She was on Aricept 5 mg and Namenda long-acting 14 mg daily. She was on Sinemet twice daily. She was on Linzess once daily in the morning on an empty stomach. She was having ongoing visual hallucinations but they were stable. Their daughter lives next door and their son lives in the area as well. I suggested we continue with the same medication regimen.  Unfortunately, in the interim, she fell at home in the bathroom and injured herself. She was hospitalized in February. I reviewed the hospital records including discharge summary on test results. She was admitted on 10/15/2014 and discharged on 10/22/2014. She sustained a traumatic pneumothorax and rib fractures. She required left tube thoracostomy on 10/17/2014. Workup also included CT head and cervical spine without contrast on 10/15/2014: 1. No signs of significant acute traumatic injury to the skull, brain or cervical spine. 2. Chronic type 2 odontoid fracture redemonstrated with progressive degenerative changes at C1 and C2, as described above. In addition, there is multilevel degenerative disc disease and cervical spondylosis throughout other portions of the cervical spine as well. 3. 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 I  personally reviewed the images through the PACS system and agree with the findings.  CT abdomen and pelvis with and without contrast on 10/15/2014 showed: Multiple left-sided segmental rib fractures with moderate-sized left pneumothorax. No acute intra-abdominal or pelvic pathology.  She was discharged to home  with home health services.   I saw her on 03/12/2014, at which time her husband reported that she does not like to wear her soft neck collar. She was using the wheelchair for a walker. He reported no recent falls. She was no longer on Aricept. She was having REM behavior disorder and nearly daily hallucinations. Her memory was deemed stable per him. She was on Namenda long-acting 14 mg once daily, carbidopa levodopa 1 pill twice daily, and I restarted her on Aricept 5 mg once daily. In the interim, she was seen by our nurse practitioner, Kathleen Haynes, on 06/16/2014, at which time she was continued on the current medication regimen and she was on Linzess for chronic constipation.  I saw her on 10/20/13, at which time I talked to them at length about her complex situation and limitation in what we can do with medical management. She had been on low-dose levodopa and needed adjustment in the past for hallucinations. We talked about her fall risk and she was essentially wheelchair dependent at the time. I did not make any changes to her medications with the exception of changing her to long-acting Namenda once daily 14 mg.  She no longer sees Kathleen Haynes in [place]. Memory loss is stable. She has been tolerating the Namenda XR 14 mg. She is on C/L 1 pill twice daily. He could split the pills without crushing. He gives her Ensure as well. Their daughter helps out and their son lives a mile away and the 3 GC come and visit.   I first met her on 04/18/2013, which time I talked to her husband a long time about the complexity of her issues. I was reluctant to increase her levodopa for fear of side effects including exacerbation of her hallucinations.   She previously followed with Kathleen Haynes and was last seen by Kathleen Haynes on 09/20/2012, at which time he started donepezil for dementia and did some blood work. He decreased her Sinemet to one tablet, alternating with half a tablet 4 times a day for a total of 300 mg of levodopa  to help with dyskinesias and hallucinations. He did not start her on amantadine because of her dementia. She was also advised to start CPAP for obstructive sleep apnea. She has been on donepezil 5 mg daily, tramadol, meloxicam, multivitamin, stool softener, Detrol LA, pilocarpine, pravastatin, atenolol-chlorthalidone, Klor-Con, Sinemet 1 tablet 8, half a tablet at noon, 1 tablet at 4 PM and half a tablet at bedtime. She had B12, TSH, RPR tested in January 2014, which were normal.   She was diagnosed with left-sided Parkinson's disease in 1999 and started on Sinemet at the time. She started having dyskinesias and started using a rolling walker with a seat and a wheelchair. She has had recurrent falls and freezing spells and festination and did not improve with selegiline which was stopped. She developed constipation and urinary incontinence and does not exercise regularly. She had hallucinations and delusions. She fell and hit her head in May 2012 and was seen in the emergency room. She was found to have a C2 vertebral fracture and CT head without contrast showed generalized atrophy. She had surgery by Kathleen Haynes in July 2012 and August 2012  she started having memory loss in 2012. In December 2012 her MMSE was 24, clock drawing was 4, and normal fluency was 9. She does not sleep well at night and takes multiple naps during the day. She was diagnosed with complex sleep apnea in March 2012 without REM onset behavior disorder. She had a repeat sleep study in April 2012 for CPAP titration but then never started CPAP. She had frequent PLMS. She takes meloxicam and tramadol for her residual head and neck pain.   She saw Dr. Brett Fairy in April 2014, and was encouraged to use her CPAP. Dr. Brett Fairy reviewed her sleep test results and compliance data with the patient and her husband at the time.   Her high cervical fracture was treated with a brace. She has a good support system with daughter living next door. She has  hallucinations especially at night. She has not been using her CPAP and it was taken back. She continually pulled off the mask in the middle of the night and the husband would wake up with a leaking air noise. He has tried but has not been able to make her use the CPAP. She used it 19/30 days and mostly for 1 to 2 hours at a time and she still had a high residual AHI. She has been on Namenda and Aricept. She has dyskinesias on the LLE. Of note, she has severe hearing loss and threw away 2 pairs of hearing aids.    Her Past Medical History Is Significant For: Past Medical History  Diagnosis Date  . Parkinson disease   . High cholesterol     since 2000  . Hypertension     since 1985  . Back pain     lower back pain , C2 fracture from fall  . Obstructive apnea   . Dementia     Her Past Surgical History Is Significant For: Past Surgical History  Procedure Laterality Date  . Shoulder surgery Left     [id number]  . Lumbar spine surgery    . Cervical spine surgery      02/2011, 03/2011  . Rotary cuff Right     Her Family History Is Significant For: Family History  Problem Relation Age of Onset  . Cancer Mother   . Congestive Heart Failure Father   . Diabetes Other   . Hypertension Other   . Hyperlipidemia Other   . Heart disease Other     Her Social History Is Significant For: Social History   Social History  . Marital Status: Married    Spouse Name: [name]  . Number of Children: 2  . Years of Education: HS   Occupational History  .     Social History Main Topics  . Smoking status: Never Smoker   . Smokeless tobacco: Never Used  . Alcohol Use: No  . Drug Use: No  . Sexual Activity: Not Asked   Other Topics Concern  . None   Social History Narrative   Patient lives at home with spouse.   Caffeine Use: 2 cups daily    Her Allergies Are:  No Known Allergies:   Her Current Medications Are:  Outpatient Encounter Prescriptions as of 05/19/2015  Medication Sig   . atenolol-chlorthalidone (TENORETIC) 50-25 MG per tablet Take 1 tablet by mouth daily.  . carbidopa-levodopa (SINEMET IR) 25-100 MG per tablet Take 1 tablet by mouth 2 (two) times daily.  Marland Kitchen donepezil (ARICEPT) 5 MG tablet Take 1 tablet (5 mg total) by mouth at  bedtime.  . Linaclotide (LINZESS) 145 MCG CAPS capsule Take 1 capsule (145 mcg total) by mouth daily.  . memantine (NAMENDA XR) 14 MG CP24 24 hr capsule Take 1 capsule (14 mg total) by mouth daily.  . methocarbamol (ROBAXIN) 500 MG tablet TAKE 1 TABLET BY MOUTH TWICE DAILY AS NEEDED FOR NECK PAIN  . potassium chloride SA (K-DUR,KLOR-CON) 20 MEQ tablet Take 20 mEq by mouth daily.  . pravastatin (PRAVACHOL) 80 MG tablet Take 80 mg by mouth daily.  . traMADol (ULTRAM) 50 MG tablet Take 50 mg by mouth every 6 (six) hours as needed for moderate pain.   . VESICARE 5 MG tablet   . [DISCONTINUED] naproxen (NAPROSYN) 500 MG tablet Take 1 tablet (500 mg total) by mouth 2 (two) times daily with a meal.  . [DISCONTINUED] tolterodine (DETROL LA) 4 MG 24 hr capsule Take 4 mg by mouth daily.   No facility-administered encounter medications on file as of 05/19/2015.  :  Review of Systems:  Out of a complete 14 point review of systems, all are reviewed and negative with the exception of these symptoms as listed below:  Review of Systems  Neurological: Positive for speech difficulty, weakness and headaches.       No new concerns reported by husband.   Memory loss  Psychiatric/Behavioral:       Agitation, depression, confusion.    Objective:  Neurologic Exam  Physical Exam Physical Examination:   Filed Vitals:   05/19/15 1305  BP: 142/70  Pulse: 62  Resp: 14    General Examination: The patient is a very pleasant 81 y.o. female in no acute distress. She is situated in her WC. She is frail and deconditioned appearing. She is extremely hard of hearing. She rubs her neck.   HEENT: Normocephalic, atraumatic, pupils are equal, round and  reactive to light and accommodation. Extraocular tracking shows moderate saccadic breakdown without nystagmus noted. There is limitation to entire gaze. There is moderate decrease in eye blink rate. Hearing is severely impaired and she is not able to follow any commands by verbal cues, but will mimic. Face is symmetric with mild facial masking and normal facial sensation. There is a mild lower jaw tremor. Neck Is moderately rigid. Oropharynx exam reveals mild mouth dryness, tongue protrudes midline. There is minimal pooling of saliva in the floor of her mouth.    Chest: is clear to auscultation without wheezing, rhonchi or crackles noted.  Heart: sounds are regular and normal without murmurs, rubs or gallops noted.   Abdomen: is soft, non-tender and non-distended with normal bowel sounds appreciated on auscultation.  Extremities: There is no pitting edema in the distal lower extremities bilaterally. Pedal pulses are intact. There are no varicose veins.  Skin: is warm and dry with no trophic changes noted. Age-related changes are noted on the skin.   Musculoskeletal: exam reveals no obvious joint deformities, tenderness, joint swelling or erythema.  Neurologically:   Mental status: The patient is awis paying attention. She is not verbal. She is following commands by mimicking. She is not able to follow any complex commands but is able to do one-step commands. She has impaired memory and comprehension but it is difficult to discern because of severe hearing loss. Affect appears to be blunted.   Cranial nerves are as described above under HEENT exam. In addition, shoulder shrug is normal with equal shoulder height noted.  Motor exam: thin bulk, and strength globally 4/5. There are mild intermittent dyskinesias noted. These are  primarily noted in the Left Lower Limb and there is L foot inversion and dystonic posturing, all unchanged.  Tone is mildly rigid with absence of cogwheeling. There is  overall moderate bradykinesia. There is no drift or rebound.  There is no tremor.   Romberg is not tested.  Reflexes are trace in the upper extremities and trace in the lower extremities.   Fine motor skills exam: are moderate to severely impaired globally. She has mild intermittent dyskinesias in the left lower extremity.  Cerebellar testing shows no dysmetria or intention tremor on finger to nose testing.  Sensory exam is intact to light touch.   Gait, station and balance: she can stand with maximum assistance from both sides. She has difficulty walking and has a tendency to shuffle both feet. She is quite unsteady. Her balance is significantly impaired. She cannot stand or walk on her own.    Assessment and Plan:   In summary, Kathleen Haynes is a very pleasant 81 year old female with an underlying medical history of hyperlipidemia, hypertension, OSA, chronic back pain, and C2 fracture from a fall, who presents for followup consultation of her advanced, left-sided predominant Parkinson's disease, complicated by severe hearing loss, memory loss, RBD, chronic constipation, advancing age with frailty and overall deconditioning, motor fluctuations and motor complications, and complex sleep apnea with intolerance to CPAP in the past and recurrent falls with injuries and residual neck pain.  she continues to be at great fall risk. Unfortunately, our options are limited because of advanced age, and medication intolerance in the past. I again discussed this with her husband in particular. She had suffered hemopneumothorax and several rib fractures in the recent past. She is on low-dose Sinemet. She is on low-dose dual memory medication. She is not safe to get up by herself for use her walker or wheelchair by herself.I suggested we continue with her current medications. He is giving her and ensure once daily. He tries to get her to eat at least 2 meals a day. She does not typically eat 3 meals a day. She  needed a dose reduction in the past of her Sinemet because of hallucinations and dyskinesias, therefore she has been on low-dose Sinemet since then. Her biggest problem continues to be her fall risk and risk for significant injuries. Her daughter spends the daytime with her for additional assistance of her husband. They have a son that lives close by and also helps out, when asked. I suggested a 4 month follow-up, sooner if needed. I answered all his questions today and the patient's husband was in agreement. I encouraged him to call for any interim problems, concerns or refill requests.  I spent 20 minutes in total face-to-face time with the patient, more than 50% of which was spent in counseling and coordination of care, reviewing test results, reviewing medication and discussing or reviewing the diagnosis of PD and dementia, the prognosis and treatment options.

## 2015-05-19 NOTE — Patient Instructions (Signed)
For your neck discomfort, try an microwavable heat pad (you can get one at [employer] in the bathroom accessory section with the back brushes, etc), and the muscle relaxant twice a day and tramadol as needed per instructions from Dr. Wilson Singer.  We will continue with your memory medications and Sinemet.

## 2015-05-27 DIAGNOSIS — Z0000 Encounter for general adult medical examination without abnormal findings: Secondary | ICD-10-CM | POA: Diagnosis not present

## 2015-05-27 DIAGNOSIS — E789 Disorder of lipoprotein metabolism, unspecified: Secondary | ICD-10-CM | POA: Diagnosis not present

## 2015-05-27 DIAGNOSIS — I10 Essential (primary) hypertension: Secondary | ICD-10-CM | POA: Diagnosis not present

## 2015-05-27 DIAGNOSIS — N3281 Overactive bladder: Secondary | ICD-10-CM | POA: Diagnosis not present

## 2015-06-03 DIAGNOSIS — N3281 Overactive bladder: Secondary | ICD-10-CM | POA: Diagnosis not present

## 2015-06-03 DIAGNOSIS — E789 Disorder of lipoprotein metabolism, unspecified: Secondary | ICD-10-CM | POA: Diagnosis not present

## 2015-06-03 DIAGNOSIS — J449 Chronic obstructive pulmonary disease, unspecified: Secondary | ICD-10-CM | POA: Diagnosis not present

## 2015-06-03 DIAGNOSIS — Z23 Encounter for immunization: Secondary | ICD-10-CM | POA: Diagnosis not present

## 2015-06-03 DIAGNOSIS — I10 Essential (primary) hypertension: Secondary | ICD-10-CM | POA: Diagnosis not present

## 2015-09-21 ENCOUNTER — Ambulatory Visit: Payer: Medicare Other | Admitting: Neurology

## 2015-09-21 ENCOUNTER — Telehealth: Payer: Self-pay

## 2015-09-21 NOTE — Telephone Encounter (Signed)
I spoke to husband and rescheduled appt due to Dr. Trenton Founds sick. They declined appt with NP.

## 2015-09-30 ENCOUNTER — Encounter: Payer: Self-pay | Admitting: Neurology

## 2015-09-30 ENCOUNTER — Ambulatory Visit (INDEPENDENT_AMBULATORY_CARE_PROVIDER_SITE_OTHER): Payer: Medicare Other | Admitting: Neurology

## 2015-09-30 VITALS — BP 102/70 | HR 62 | Resp 14

## 2015-09-30 DIAGNOSIS — R413 Other amnesia: Secondary | ICD-10-CM

## 2015-09-30 DIAGNOSIS — G4752 REM sleep behavior disorder: Secondary | ICD-10-CM | POA: Diagnosis not present

## 2015-09-30 DIAGNOSIS — R443 Hallucinations, unspecified: Secondary | ICD-10-CM | POA: Diagnosis not present

## 2015-09-30 DIAGNOSIS — F0280 Dementia in other diseases classified elsewhere without behavioral disturbance: Secondary | ICD-10-CM | POA: Diagnosis not present

## 2015-09-30 DIAGNOSIS — K5900 Constipation, unspecified: Secondary | ICD-10-CM

## 2015-09-30 DIAGNOSIS — K5909 Other constipation: Secondary | ICD-10-CM

## 2015-09-30 DIAGNOSIS — R54 Age-related physical debility: Secondary | ICD-10-CM

## 2015-09-30 DIAGNOSIS — G20 Parkinson's disease: Secondary | ICD-10-CM

## 2015-09-30 MED ORDER — CARBIDOPA-LEVODOPA 25-100 MG PO TABS: 1.0000 | ORAL_TABLET | Freq: Two times a day (BID) | ORAL | Status: DC

## 2015-09-30 MED ORDER — DONEPEZIL HCL 5 MG PO TABS: 5.0000 mg | ORAL_TABLET | Freq: Every day | ORAL | Status: DC

## 2015-09-30 MED ORDER — MEMANTINE HCL ER 14 MG PO CP24: 14.0000 mg | ORAL_CAPSULE | Freq: Every day | ORAL | Status: DC

## 2015-09-30 NOTE — Progress Notes (Signed)
Subjective:    Patient ID: Kathleen Haynes is a 82 y.o. female.  HPI     Interim history:   Kathleen Haynes is a very pleasant 82 year old right-handed woman with an underlying medical history of hyperlipidemia, hypertension, OSA, chronic back pain, and C2 fracture from a fall, who presents for followup consultation of her left-sided predominant Parkinson's disease, complicated by severe hearing loss, urinary incontinence, chronic constipation, recurrent falls, memory loss, RBD, hallucinations, frailty, advancing age, neck pain, s/p neck surgery under Dr. Vertell Limber, motor fluctuations, motor complications, and complex sleep apnea (Hx of CPAP intolerance). She is accompanied by her husband again today. I last saw her on 05/19/2015 at which time she was essentially nonverbal. She had residual severe neck pain. She was on tramadol for this. She had seen Dr. Vertell Limber in follow-up for her neck pain but there was really nothing else he could do for her. She was on Robaxin up to twice daily and was sleepy during the day. I suggested we continue with her long-acting Namenda 14 mg daily, Aricept 5 mg daily, generic, and Sinemet 1 tablet twice daily. They were advised to try a microwavable heat pad for her neck pain.  Today, 09/30/2015: She reports not doing great, but does not specify. Her husband reports a fall about 2 weeks ago, she has a tendency to get out of bed at night and walk without her walker. This is how she fell a couple weeks ago plan. She fell on hardwood floor and bruised her right forearm. She has had hearing aids for the past month and this has improved her communication with her husband quite a bit. She tries to watch TV. She looks at the newspaper but he is not sure if she reads it. Otherwise, he feels that she has been stable. She has occasional neck pain. He cancer tramadol usually once or twice a day.  Previously:   I saw her on 01/15/2015, at which time her husband provided her entire history. She  had improvement in her pain. She had finished home health therapy. She was sleep talking. She was not always compliant with her walker and had a tendency to get up and hold onto things for support. She was not always asking for help or waiting for help. I suggested that we continue her memory medications and Sinemet at the current doses. She was reminded to drink more water and never to get up by herself and to use a walker only with additional assistance.  I saw her on 09/15/2014, at which time she reported improved constipation. She was advised to drink more water and less Pepsi. She was having vivid dreams. She was talking in her sleep. She had no significant issues with REM behavior disorder. She was using her walker or her wheelchair is a walker and sometimes was holding onto things at home. Her memory was stable. She was on Aricept 5 mg and Namenda long-acting 14 mg daily. She was on Sinemet twice daily. She was on Linzess once daily in the morning on an empty stomach. She was having ongoing visual hallucinations but they were stable. Their daughter lives next door and their son lives in the area as well. I suggested we continue with the same medication regimen.  Unfortunately, in the interim, she fell at home in the bathroom and injured herself. She was hospitalized in February. I reviewed the hospital records including discharge summary on test results. She was admitted on 10/15/2014 and discharged on 10/22/2014. She sustained a  traumatic pneumothorax and rib fractures. She required left tube thoracostomy on 10/17/2014. Workup also included CT head and cervical spine without contrast on 10/15/2014: 1. No signs of significant acute traumatic injury to the skull, brain or cervical spine. 2. Chronic type 2 odontoid fracture redemonstrated with progressive degenerative changes at C1 and C2, as described above. In addition, there is multilevel degenerative disc disease and cervical spondylosis throughout  other portions of the cervical spine as well. 3. 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 I personally reviewed the images through the PACS system and agree with the findings.  CT abdomen and pelvis with and without contrast on 10/15/2014 showed: Multiple left-sided segmental rib fractures with moderate-sized left pneumothorax. No acute intra-abdominal or pelvic pathology.  She was discharged to home with home health services.   I saw her on 03/12/2014, at which time her husband reported that she does not like to wear her soft neck collar. She was using the wheelchair for a walker. He reported no recent falls. She was no longer on Aricept. She was having REM behavior disorder and nearly daily hallucinations. Her memory was deemed stable per him. She was on Namenda long-acting 14 mg once daily, carbidopa levodopa 1 pill twice daily, and I restarted her on Aricept 5 mg once daily. In the interim, she was seen by our nurse practitioner, Ms. Lam, on 06/16/2014, at which time she was continued on the current medication regimen and she was on Linzess for chronic constipation.  I saw her on 10/20/13, at which time I talked to them at length about her complex situation and limitation in what we can do with medical management. She had been on low-dose levodopa and needed adjustment in the past for hallucinations. We talked about her fall risk and she was essentially wheelchair dependent at the time. I did not make any changes to her medications with the exception of changing her to long-acting Namenda once daily 14 mg.  She no longer sees Dr. Vertell Limber in [place]. Memory loss is stable. She has been tolerating the Namenda XR 14 mg. She is on C/L 1 pill twice daily. He could split the pills without crushing. He gives her Ensure as well. Their daughter helps out and  their son lives a mile away and the 3 GC come and visit.   I first met her on 04/18/2013, which time I talked to her husband a long time about the complexity of her issues. I was reluctant to increase her levodopa for fear of side effects including exacerbation of her hallucinations.   She previously followed with Dr. Morene Antu and was last seen by Dr. Erling Cruz on 09/20/2012, at which time he started donepezil for dementia and did some blood work. He decreased her Sinemet to one tablet, alternating with half a tablet 4 times a day for a total of 300 mg of levodopa to help with dyskinesias and hallucinations. He did not start her on amantadine because of her dementia. She was also advised to start CPAP for obstructive sleep apnea. She has been on donepezil 5 mg daily, tramadol, meloxicam, multivitamin, stool softener, Detrol LA, pilocarpine, pravastatin, atenolol-chlorthalidone, Klor-Con, Sinemet 1 tablet 8, half a tablet at noon, 1 tablet at 4 PM and half a tablet at bedtime. She had B12, TSH, RPR tested in January 2014, which were normal.   She was diagnosed with left-sided  Parkinson's disease in 1999 and started on Sinemet at the time. She started having dyskinesias and started using a rolling walker with a seat and a wheelchair. She has had recurrent falls and freezing spells and festination and did not improve with selegiline which was stopped. She developed constipation and urinary incontinence and does not exercise regularly. She had hallucinations and delusions. She fell and hit her head in May 2012 and was seen in the emergency room. She was found to have a C2 vertebral fracture and CT head without contrast showed generalized atrophy. She had surgery by Dr. Vertell Limber in July 2012 and August 2012 she started having memory loss in 2012. In December 2012 her MMSE was 24, clock drawing was 4, and normal fluency was 9. She does not sleep well at night and takes multiple naps during the day. She was diagnosed with  complex sleep apnea in March 2012 without REM onset behavior disorder. She had a repeat sleep study in April 2012 for CPAP titration but then never started CPAP. She had frequent PLMS. She takes meloxicam and tramadol for her residual head and neck pain.   She saw Dr. Brett Fairy in April 2014, and was encouraged to use her CPAP. Dr. Brett Fairy reviewed her sleep test results and compliance data with the patient and her husband at the time.   Her high cervical fracture was treated with a brace. She has a good support system with daughter living next door. She has hallucinations especially at night. She has not been using her CPAP and it was taken back. She continually pulled off the mask in the middle of the night and the husband would wake up with a leaking air noise. He has tried but has not been able to make her use the CPAP. She used it 19/30 days and mostly for 1 to 2 hours at a time and she still had a high residual AHI. She has been on Namenda and Aricept. She has dyskinesias on the LLE. Of note, she has severe hearing loss and threw away 2 pairs of hearing aids.    Her Past Medical History Is Significant For: Past Medical History  Diagnosis Date  . Parkinson disease ([place])   . High cholesterol     since 2000  . Hypertension     since 1985  . Back pain     lower back pain , C2 fracture from fall  . Obstructive apnea   . Dementia     Her Past Surgical History Is Significant For: Past Surgical History  Procedure Laterality Date  . Shoulder surgery Left     [id number]  . Lumbar spine surgery    . Cervical spine surgery      02/2011, 03/2011  . Rotary cuff Right     Her Family History Is Significant For: Family History  Problem Relation Age of Onset  . Cancer Mother   . Congestive Heart Failure Father   . Diabetes Other   . Hypertension Other   . Hyperlipidemia Other   . Heart disease Other     Her Social History Is Significant For: Social History   Social History  . Marital  Status: Married    Spouse Name: [name]  . Number of Children: 2  . Years of Education: HS   Occupational History  .     Social History Main Topics  . Smoking status: Never Smoker   . Smokeless tobacco: Never Used  . Alcohol Use: No  . Drug Use: No  .  Sexual Activity: Not Asked   Other Topics Concern  . None   Social History Narrative   Patient lives at home with spouse.   Caffeine Use: 2 cups daily    Her Allergies Are:  No Known Allergies:   Her Current Medications Are:  Outpatient Encounter Prescriptions as of 09/30/2015  Medication Sig  . atenolol-chlorthalidone (TENORETIC) 50-25 MG per tablet Take 1 tablet by mouth daily.  . carbidopa-levodopa (SINEMET IR) 25-100 MG per tablet Take 1 tablet by mouth 2 (two) times daily.  Marland Kitchen donepezil (ARICEPT) 5 MG tablet Take 1 tablet (5 mg total) by mouth at bedtime.  . memantine (NAMENDA XR) 14 MG CP24 24 hr capsule Take 1 capsule (14 mg total) by mouth daily.  . potassium chloride SA (K-DUR,KLOR-CON) 20 MEQ tablet Take 20 mEq by mouth daily.  . pravastatin (PRAVACHOL) 80 MG tablet Take 80 mg by mouth daily.  . traMADol (ULTRAM) 50 MG tablet Take 50 mg by mouth every 6 (six) hours as needed for moderate pain.   . VESICARE 5 MG tablet   . [DISCONTINUED] Linaclotide (LINZESS) 145 MCG CAPS capsule Take 1 capsule (145 mcg total) by mouth daily.  . [DISCONTINUED] methocarbamol (ROBAXIN) 500 MG tablet TAKE 1 TABLET BY MOUTH TWICE DAILY AS NEEDED FOR NECK PAIN   No facility-administered encounter medications on file as of 09/30/2015.  :  Review of Systems:  Out of a complete 14 point review of systems, all are reviewed and negative with the exception of these symptoms as listed below:   Review of Systems  Neurological:       Husband reports that patient gets up in the middle of the night and wonders the house. She has fallen recently in the night.     Objective:  Neurologic Exam  Physical Exam Physical Examination:   Filed Vitals:    09/30/15 1439  BP: 102/70  Pulse: 62  Resp: 14    General Examination: The patient is a very pleasant 82y.o. female in no acute distress. She is situated in her WC. She is frail and deconditioned appearing.    HEENT: Normocephalic, atraumatic, pupils are equal, round and reactive to light and accommodation. Extraocular tracking shows moderate to severe saccadic breakdown without nystagmus noted. There is limitation to entire gaze. There is moderate decrease in eye blink rate. Hearing is better with b/l hearing aids in place. Face is symmetric with mild facial masking and normal facial sensation. There is a mild lower jaw tremor. Neck Is moderately rigid. Oropharynx exam reveals mild mouth dryness, tongue protrudes midline. There is minimal pooling of saliva in the floor of her mouth.    Chest: is clear to auscultation without wheezing, rhonchi or crackles noted.  Heart: sounds are regular and normal without murmurs, rubs or gallops noted.   Abdomen: is soft, non-tender and non-distended with normal bowel sounds appreciated on auscultation.  Extremities: There is no pitting edema in the distal lower extremities bilaterally. Pedal pulses are intact. There are no varicose veins.  Skin: is warm and dry with no trophic changes noted. Age-related changes are noted on the skin.   Musculoskeletal: exam reveals no obvious joint deformities, tenderness, joint swelling or erythema.  Neurologically:   Mental status: The patient is paying good attention and is able to follow simple commands, and answers simple questions. She is not able to follow any complex commands but is able to do one-step commands. She has impaired memory and comprehension. Affect is better.    Cranial  nerves are as described above under HEENT exam. In addition, shoulder shrug is normal with equal shoulder height noted.  Motor exam: thin bulk, and strength globally 4/5. There are mild intermittent dyskinesias noted. These are  primarily noted in the Left Lower Limb and there is L foot inversion and dystonic posturing, all unchanged.  Tone is mildly rigid with absence of cogwheeling. There is overall moderate bradykinesia. There is no drift or rebound.  There is no tremor.   Romberg is not tested.  Reflexes are trace in the upper extremities and trace in the lower extremities.   Fine motor skills exam: are moderate to severely impaired globally. She has mild intermittent dyskinesias in the left lower extremity.  Cerebellar testing shows no dysmetria or intention tremor on finger to nose testing.  Sensory exam is intact to light touch.   Gait, station and balance: she can stand with maximum assistance from both sides.  She cannot stand or walk on her own.    Assessment and Plan:   In summary, MARLYSS CISSELL is a very pleasant 82 year old female with an underlying medical history of hyperlipidemia, hypertension, OSA, chronic back pain, and C2 fracture from a fall, who presents for followup consultation of her advanced, left-sided predominant Parkinson's disease, complicated by severe hearing loss, memory loss, RBD, chronic constipation, advancing age with frailty and overall deconditioning, motor fluctuations and motor complications, and complex sleep apnea with intolerance to CPAP in the past and recurrent falls with injuries and residual neck pain. Hearing has improved with the hearing aids. She has a tendency to get up at night and walk without the walker. This is how she tends to fall. She continues to be at great fall risk.  She is again advised strongly not to get out and walk without her walker or with her husband's assistance or the wheelchair. Unfortunately, our Medication options are limited because of advanced age, and medication intolerance in the past. I again discussed this with her husband in particular. She had suffered hemopneumothorax and several rib fractures in the recent past. She is on low-dose Sinemet.  She is on low-dose dual memory medication. She is not safe to get up by herself. I suggested we continue with her current medications. He is giving her ensure once daily.  He is going to look into a bed alarm to be placed in her bed so he gets alerted when she tries to get out of bed at night.2. I renewed her prescriptions, and suggested a four-month to 6 month follow-up, sooner if needed. I answered all his questions today and they were in agreement. I encouraged him to call for any interim problems, concerns or refill requests.  I spent 20 minutes in total face-to-face time with the patient, more than 50% of which was spent in counseling and coordination of care, reviewing test results, reviewing medication and discussing or reviewing the diagnosis of PD and dementia, the prognosis and treatment options.

## 2015-09-30 NOTE — Patient Instructions (Signed)
We will keep the medications the same.  Exam looks stable.  The hearing aids have indeed helped.  Drink more water.  You are at fall risk, do not walk without your walker!

## 2015-11-30 ENCOUNTER — Encounter (HOSPITAL_COMMUNITY): Payer: Self-pay

## 2015-11-30 ENCOUNTER — Inpatient Hospital Stay (HOSPITAL_COMMUNITY)
Admission: EM | Admit: 2015-11-30 | Discharge: 2015-12-02 | DRG: 536 | Disposition: A | Discharge status: Home Health | Payer: Medicare Other | Attending: Internal Medicine | Admitting: Internal Medicine

## 2015-11-30 ENCOUNTER — Emergency Department (HOSPITAL_COMMUNITY): Payer: Medicare Other

## 2015-11-30 ENCOUNTER — Inpatient Hospital Stay (HOSPITAL_COMMUNITY): Payer: Medicare Other

## 2015-11-30 DIAGNOSIS — B359 Dermatophytosis, unspecified: Secondary | ICD-10-CM | POA: Diagnosis present

## 2015-11-30 DIAGNOSIS — W06XXXA Fall from bed, initial encounter: Secondary | ICD-10-CM | POA: Diagnosis present

## 2015-11-30 DIAGNOSIS — E7800 Pure hypercholesterolemia, unspecified: Secondary | ICD-10-CM | POA: Diagnosis not present

## 2015-11-30 DIAGNOSIS — F0280 Dementia in other diseases classified elsewhere without behavioral disturbance: Secondary | ICD-10-CM | POA: Diagnosis present

## 2015-11-30 DIAGNOSIS — Z8249 Family history of ischemic heart disease and other diseases of the circulatory system: Secondary | ICD-10-CM | POA: Diagnosis not present

## 2015-11-30 DIAGNOSIS — S72009A Fracture of unspecified part of neck of unspecified femur, initial encounter for closed fracture: Secondary | ICD-10-CM | POA: Insufficient documentation

## 2015-11-30 DIAGNOSIS — Z993 Dependence on wheelchair: Secondary | ICD-10-CM | POA: Diagnosis not present

## 2015-11-30 DIAGNOSIS — S0003XA Contusion of scalp, initial encounter: Secondary | ICD-10-CM | POA: Diagnosis not present

## 2015-11-30 DIAGNOSIS — G20 Parkinson's disease: Secondary | ICD-10-CM | POA: Diagnosis not present

## 2015-11-30 DIAGNOSIS — S0990XA Unspecified injury of head, initial encounter: Secondary | ICD-10-CM | POA: Diagnosis not present

## 2015-11-30 DIAGNOSIS — E785 Hyperlipidemia, unspecified: Secondary | ICD-10-CM | POA: Diagnosis not present

## 2015-11-30 DIAGNOSIS — K5900 Constipation, unspecified: Secondary | ICD-10-CM | POA: Diagnosis not present

## 2015-11-30 DIAGNOSIS — Z79899 Other long term (current) drug therapy: Secondary | ICD-10-CM | POA: Diagnosis not present

## 2015-11-30 DIAGNOSIS — G4733 Obstructive sleep apnea (adult) (pediatric): Secondary | ICD-10-CM | POA: Diagnosis present

## 2015-11-30 DIAGNOSIS — Y92009 Unspecified place in unspecified non-institutional (private) residence as the place of occurrence of the external cause: Secondary | ICD-10-CM | POA: Diagnosis not present

## 2015-11-30 DIAGNOSIS — Z23 Encounter for immunization: Secondary | ICD-10-CM | POA: Diagnosis not present

## 2015-11-30 DIAGNOSIS — S01511A Laceration without foreign body of lip, initial encounter: Secondary | ICD-10-CM | POA: Diagnosis present

## 2015-11-30 DIAGNOSIS — Z833 Family history of diabetes mellitus: Secondary | ICD-10-CM

## 2015-11-30 DIAGNOSIS — I4581 Long QT syndrome: Secondary | ICD-10-CM | POA: Diagnosis not present

## 2015-11-30 DIAGNOSIS — E43 Unspecified severe protein-calorie malnutrition: Secondary | ICD-10-CM

## 2015-11-30 DIAGNOSIS — R220 Localized swelling, mass and lump, head: Secondary | ICD-10-CM | POA: Diagnosis not present

## 2015-11-30 DIAGNOSIS — Z809 Family history of malignant neoplasm, unspecified: Secondary | ICD-10-CM

## 2015-11-30 DIAGNOSIS — S72001A Fracture of unspecified part of neck of right femur, initial encounter for closed fracture: Principal | ICD-10-CM | POA: Diagnosis present

## 2015-11-30 DIAGNOSIS — S72114A Nondisplaced fracture of greater trochanter of right femur, initial encounter for closed fracture: Secondary | ICD-10-CM | POA: Diagnosis not present

## 2015-11-30 DIAGNOSIS — S72141A Displaced intertrochanteric fracture of right femur, initial encounter for closed fracture: Secondary | ICD-10-CM | POA: Diagnosis not present

## 2015-11-30 DIAGNOSIS — T148XXA Other injury of unspecified body region, initial encounter: Secondary | ICD-10-CM

## 2015-11-30 DIAGNOSIS — I10 Essential (primary) hypertension: Secondary | ICD-10-CM | POA: Diagnosis not present

## 2015-11-30 DIAGNOSIS — Z01818 Encounter for other preprocedural examination: Secondary | ICD-10-CM | POA: Diagnosis not present

## 2015-11-30 LAB — CBC
HEMATOCRIT: 36.6 % (ref 36.0–46.0)
Hemoglobin: 11.7 g/dL — ABNORMAL LOW (ref 12.0–15.0)
MCH: 28.5 pg (ref 26.0–34.0)
MCHC: 32 g/dL (ref 30.0–36.0)
MCV: 89.1 fL (ref 78.0–100.0)
Platelets: 344 10*3/uL (ref 150–400)
RBC: 4.11 MIL/uL (ref 3.87–5.11)
RDW: 14.3 % (ref 11.5–15.5)
WBC: 6.4 10*3/uL (ref 4.0–10.5)

## 2015-11-30 LAB — BASIC METABOLIC PANEL
Anion gap: 14 (ref 5–15)
BUN: 22 mg/dL — ABNORMAL HIGH (ref 6–20)
CHLORIDE: 97 mmol/L — AB (ref 101–111)
CO2: 27 mmol/L (ref 22–32)
CREATININE: 1.18 mg/dL — AB (ref 0.44–1.00)
Calcium: 9.7 mg/dL (ref 8.9–10.3)
GFR calc non Af Amer: 41 mL/min — ABNORMAL LOW (ref >60)
GFR, EST AFRICAN AMERICAN: 47 mL/min — AB (ref >60)
Glucose, Bld: 86 mg/dL (ref 65–99)
POTASSIUM: 3.9 mmol/L (ref 3.5–5.1)
SODIUM: 138 mmol/L (ref 135–145)

## 2015-11-30 LAB — CBG MONITORING, ED: Glucose-Capillary: 69 mg/dL (ref 65–99)

## 2015-11-30 LAB — URINALYSIS, ROUTINE W REFLEX MICROSCOPIC
Bilirubin Urine: NEGATIVE
GLUCOSE, UA: NEGATIVE mg/dL
HGB URINE DIPSTICK: NEGATIVE
Ketones, ur: 15 mg/dL — AB
LEUKOCYTES UA: NEGATIVE
Nitrite: NEGATIVE
PH: 6 (ref 5.0–8.0)
Protein, ur: NEGATIVE mg/dL
SPECIFIC GRAVITY, URINE: 1.019 (ref 1.005–1.030)

## 2015-11-30 MED ORDER — HEPARIN SODIUM (PORCINE) 5000 UNIT/ML IJ SOLN
5000.0000 [IU] | Freq: Three times a day (TID) | INTRAMUSCULAR | Status: DC
  Administered 2015-12-01 – 2015-12-02 (×5): 5000 [IU] via SUBCUTANEOUS
  Filled 2015-11-30 (×5): qty 1

## 2015-11-30 MED ORDER — HYDROCODONE-ACETAMINOPHEN 5-325 MG PO TABS
1.0000 | ORAL_TABLET | Freq: Four times a day (QID) | ORAL | Status: DC | PRN
  Administered 2015-11-30: 1 via ORAL
  Filled 2015-11-30: qty 1
  Filled 2015-11-30: qty 2

## 2015-11-30 MED ORDER — ATENOLOL-CHLORTHALIDONE 50-25 MG PO TABS: 1.0000 | ORAL_TABLET | Freq: Every day | ORAL | Status: DC

## 2015-11-30 MED ORDER — DARIFENACIN HYDROBROMIDE ER 7.5 MG PO TB24
7.5000 mg | ORAL_TABLET | Freq: Every day | ORAL | Status: DC
  Administered 2015-12-01 – 2015-12-02 (×2): 7.5 mg via ORAL
  Filled 2015-11-30 (×2): qty 1

## 2015-11-30 MED ORDER — TETANUS-DIPHTH-ACELL PERTUSSIS 5-2.5-18.5 LF-MCG/0.5 IM SUSP
0.5000 mL | Freq: Once | INTRAMUSCULAR | Status: AC
  Administered 2015-11-30: 0.5 mL via INTRAMUSCULAR
  Filled 2015-11-30: qty 0.5

## 2015-11-30 MED ORDER — TRAMADOL HCL 50 MG PO TABS
50.0000 mg | ORAL_TABLET | Freq: Four times a day (QID) | ORAL | Status: DC | PRN
  Administered 2015-12-01 (×2): 50 mg via ORAL
  Filled 2015-11-30 (×2): qty 1

## 2015-11-30 MED ORDER — PRAVASTATIN SODIUM 40 MG PO TABS
80.0000 mg | ORAL_TABLET | Freq: Every day | ORAL | Status: DC
  Filled 2015-11-30: qty 2

## 2015-11-30 MED ORDER — CHLORTHALIDONE 25 MG PO TABS
25.0000 mg | ORAL_TABLET | Freq: Every day | ORAL | Status: DC
  Filled 2015-11-30: qty 1

## 2015-11-30 MED ORDER — MEMANTINE HCL ER 14 MG PO CP24
14.0000 mg | ORAL_CAPSULE | Freq: Every day | ORAL | Status: DC
  Administered 2015-12-01 – 2015-12-02 (×2): 14 mg via ORAL
  Filled 2015-11-30 (×2): qty 1

## 2015-11-30 MED ORDER — CARBIDOPA-LEVODOPA 25-100 MG PO TABS
1.0000 | ORAL_TABLET | Freq: Two times a day (BID) | ORAL | Status: DC
  Administered 2015-11-30 – 2015-12-02 (×4): 1 via ORAL
  Filled 2015-11-30 (×4): qty 1

## 2015-11-30 MED ORDER — ATENOLOL 50 MG PO TABS
50.0000 mg | ORAL_TABLET | Freq: Every day | ORAL | Status: DC
  Administered 2015-12-02: 50 mg via ORAL
  Filled 2015-11-30 (×2): qty 1

## 2015-11-30 MED ORDER — ONDANSETRON HCL 4 MG/2ML IJ SOLN: 4.0000 mg | Freq: Three times a day (TID) | INTRAMUSCULAR | Status: DC | PRN

## 2015-11-30 MED ORDER — LIDOCAINE HCL (PF) 1 % IJ SOLN
5.0000 mL | Freq: Once | INTRAMUSCULAR | Status: AC
  Administered 2015-11-30: 5 mL via INTRADERMAL
  Filled 2015-11-30: qty 5

## 2015-11-30 MED ORDER — MORPHINE SULFATE (PF) 2 MG/ML IV SOLN
0.5000 mg | INTRAVENOUS | Status: DC | PRN
  Administered 2015-12-01: 0.5 mg via INTRAVENOUS
  Filled 2015-11-30: qty 1

## 2015-11-30 MED ORDER — POTASSIUM CHLORIDE CRYS ER 20 MEQ PO TBCR
20.0000 meq | EXTENDED_RELEASE_TABLET | Freq: Every day | ORAL | Status: DC
  Administered 2015-12-01 – 2015-12-02 (×2): 20 meq via ORAL
  Filled 2015-11-30 (×2): qty 1

## 2015-11-30 MED ORDER — DONEPEZIL HCL 5 MG PO TABS
5.0000 mg | ORAL_TABLET | Freq: Every day | ORAL | Status: DC
  Administered 2015-11-30 – 2015-12-01 (×2): 5 mg via ORAL
  Filled 2015-11-30 (×2): qty 1

## 2015-11-30 NOTE — ED Notes (Signed)
Pts daughter now available and reports the patient did not call for help when getting out of bed. Pt attempted to stand, became dizzy and had unwitnessed fall. Daughter found pt laying on her right side on the floor. Daughter reports the two missing teeth is not new. Daughter confirms pt is not on any blood thinners. Loss of consciousness still unknown.

## 2015-11-30 NOTE — ED Notes (Signed)
Pt fell today and has hematoma above right eye with scant bleeding, two bottom front teeth missing and a, open through and through, un-approximated laceration above her upper lip. Pt has parkinsons disease and only alert to self. No family at triage at this time to further reveal nature of the fall. According to [facility], pt is not on any blood thinners. Unknown LOC at this time.

## 2015-11-30 NOTE — ED Provider Notes (Signed)
CSN: [id number]     Arrival date & time 11/30/15  1509 History   First MD Initiated Contact with Patient 11/30/15 1600     Chief Complaint  Patient presents with  . Fall     (Consider location/radiation/quality/duration/timing/severity/associated sxs/prior Treatment) HPI Comments: Pt has demential  Had fall at home by bed trying to get to the bedside commode - had c/;o hematoma to the R forehead and lac to the upper lip.  She doesn't have memory of event - daughter brought her in for evaluation.  Has had good appetite and no f/c/n/v or cough.  Patient is a 82 y.o. female presenting with fall. The history is provided by the patient, a relative and medical records.  Fall    Past Medical History  Diagnosis Date  . Parkinson disease ([place])   . High cholesterol     since 2000  . Hypertension     since 1985  . Back pain     lower back pain , C2 fracture from fall  . Obstructive apnea   . Dementia    Past Surgical History  Procedure Laterality Date  . Shoulder surgery Left     [id number]  . Lumbar spine surgery    . Cervical spine surgery      02/2011, 03/2011  . Rotary cuff Right    Family History  Problem Relation Age of Onset  . Cancer Mother   . Congestive Heart Failure Father   . Diabetes Other   . Hypertension Other   . Hyperlipidemia Other   . Heart disease Other    Social History  Substance Use Topics  . Smoking status: Never Smoker   . Smokeless tobacco: Never Used  . Alcohol Use: No   OB History    No data available     Review of Systems  Unable to perform ROS: Dementia      Allergies  Review of patient's allergies indicates no known allergies.  Home Medications   Prior to Admission medications   Medication Sig Start Date End Date Taking? Authorizing Provider  atenolol-chlorthalidone (TENORETIC) 50-25 MG per tablet Take 1 tablet by mouth daily.    Historical Provider, MD  carbidopa-levodopa (SINEMET IR) 25-100 MG tablet Take 1 tablet by mouth 2  (two) times daily. 09/30/15   Star Age, MD  donepezil (ARICEPT) 5 MG tablet Take 1 tablet (5 mg total) by mouth at bedtime. 09/30/15   Star Age, MD  memantine (NAMENDA XR) 14 MG CP24 24 hr capsule Take 1 capsule (14 mg total) by mouth daily. 09/30/15   Star Age, MD  potassium chloride SA (K-DUR,KLOR-CON) 20 MEQ tablet Take 20 mEq by mouth daily.    Historical Provider, MD  pravastatin (PRAVACHOL) 80 MG tablet Take 80 mg by mouth daily.    Historical Provider, MD  traMADol (ULTRAM) 50 MG tablet Take 50 mg by mouth every 6 (six) hours as needed for moderate pain.  09/02/14   Historical Provider, MD  VESICARE 5 MG tablet  04/02/15   Historical Provider, MD   BP 142/75 mmHg  Pulse 85  Temp(Src) 98.8 F (37.1 C) (Oral)  Resp 20  SpO2 97% Physical Exam  Constitutional: She appears well-developed and well-nourished. No distress.  HENT:  Head: Normocephalic.  Mouth/Throat: Oropharynx is clear and moist. No oropharyngeal exudate.  no facial tenderness, deformity, malocclusion or hemotympanum.  no battle's sign or racoon eyes. Hematoma to the R forehead and lac to the mid upper lip - with small through  and through hole.  Is 2 cm in length  Eyes: Conjunctivae and EOM are normal. Pupils are equal, round, and reactive to light. Right eye exhibits no discharge. Left eye exhibits no discharge. No scleral icterus.  Neck: Normal range of motion. Neck supple. No JVD present. No thyromegaly present.  Cardiovascular: Normal rate, regular rhythm, normal heart sounds and intact distal pulses.  Exam reveals no gallop and no friction rub.   No murmur heard. Pulmonary/Chest: Effort normal and breath sounds normal. No respiratory distress. She has no wheezes. She has no rales.  Abdominal: Soft. Bowel sounds are normal. She exhibits no distension and no mass. There is no tenderness.  Musculoskeletal: Normal range of motion. She exhibits no edema or tenderness.  FROM of all extremities - supple joints including hips  and compartments are all soft.  Lymphadenopathy:    She has no cervical adenopathy.  Neurological: She is alert. Coordination normal.  Follows commands, no facial droop - no obvious strength defecit.  Skin: Skin is warm and dry. No rash noted. No erythema.  Psychiatric: She has a normal mood and affect. Her behavior is normal.  Nursing note and vitals reviewed.   ED Course  .Marland KitchenLaceration Repair Date/Time: 11/30/2015 6:01 PM Performed by: Noemi Chapel Authorized by: Noemi Chapel Consent: Verbal consent obtained. Risks and benefits: risks, benefits and alternatives were discussed Consent given by: patient and guardian Patient understanding: patient states understanding of the procedure being performed Imaging studies: imaging studies available Required items: required blood products, implants, devices, and special equipment available Patient identity confirmed: hospital-assigned identification number Time out: Immediately prior to procedure a "time out" was called to verify the correct patient, procedure, equipment, support staff and site/side marked as required. Body area: head/neck Location details: upper lip Full thickness lip laceration: yes [place] border involved: no Lip laceration height: more than half vertical height Laceration length: 2 cm Foreign bodies: no foreign bodies Tendon involvement: none Nerve involvement: none Vascular damage: no Anesthesia: local infiltration Local anesthetic: lidocaine 1% without epinephrine Anesthetic total: 2 ml Patient sedated: no Preparation: Patient was prepped and draped in the usual sterile fashion. Irrigation solution: saline Irrigation method: syringe Amount of cleaning: standard Debridement: none Degree of undermining: none Skin closure: 6-0 Prolene Number of sutures: 3 Technique: simple Approximation: close Approximation difficulty: simple Dressing: antibiotic ointment Patient tolerance: Patient tolerated the  procedure well with no immediate complications   (including critical care time) Labs Review Labs Reviewed  BASIC METABOLIC PANEL - Abnormal; Notable for the following:    Chloride 97 (*)    BUN 22 (*)    Creatinine, Ser 1.18 (*)    GFR calc non Af Amer 41 (*)    GFR calc Af Amer 47 (*)    All other components within normal limits  CBC - Abnormal; Notable for the following:    Hemoglobin 11.7 (*)    All other components within normal limits  URINALYSIS, ROUTINE W REFLEX MICROSCOPIC (NOT AT [facility]) - Abnormal; Notable for the following:    Color, Urine AMBER (*)    Ketones, ur 15 (*)    All other components within normal limits  CBG MONITORING, ED    Imaging Review Dg Pelvis 1-2 Views  11/30/2015  CLINICAL DATA:  [place] today, history of dementia EXAM: PELVIS - 1-2 VIEW COMPARISON:  CT abdomen pelvis 10/15/2014 FINDINGS: There appears to be an acute right femoral neck fracture with some displacement. Additional views may be helpful to evaluate further. The left hip is  intact although mild degenerative change involves both hips. The pelvic rami appear intact. Hardware for fusion of the lower lumbar spine is noted. IMPRESSION: Acute right femoral neck fracture. Recommend additional views to evaluate further. Electronically Signed   By: Ivar Drape M.D.   On: 11/30/2015 17:19   Ct Head Wo Contrast  11/30/2015  CLINICAL DATA:  Fall today EXAM: CT HEAD WITHOUT CONTRAST TECHNIQUE: Contiguous axial images were obtained from the base of the skull through the vertex without intravenous contrast. COMPARISON:  10/15/2014 FINDINGS: Global atrophy. Encephalomalacia in the bilateral frontal lobes. Chronic ischemic changes in the periventricular white matter. No mass effect, midline shift, or acute hemorrhage. Soft tissue swelling over the right frontal bone. There is gas in the subcutaneous tissues of unknown significance. Old left zygomatic arch fracture. Mastoid air cells are clear. Visualized paranasal  sinuses are clear. Chronic odontoid IMPRESSION: No acute intracranial pathology. Soft tissue swelling containing gas over the right frontal bone is noted. Electronically Signed   By: Marybelle Killings M.D.   On: 11/30/2015 17:43   I have personally reviewed and evaluated these images and lab results as part of my medical decision-making.   EKG Interpretation   Date/Time:  Tuesday November 30 2015 15:21:44 EDT Ventricular Rate:  92 PR Interval:  176 QRS Duration: 70 QT Interval:  346 QTC Calculation: 427 R Axis:   -29 Text Interpretation:  Normal sinus rhythm ST \\T \ T wave abnormality,  consider lateral ischemia Abnormal ECG since last tracing no significant  change Confirmed by Lesley Atkin  MD, Jessicalynn Deshong ([id number]) on 11/30/2015 6:00:34 PM      MDM   Final diagnoses:  Hip fracture, right, closed, initial encounter ([place])  Lip laceration, initial encounter   Will need lac repari - VS normal - family requests CT of the head TDAP Well appearing otherwise. Metabolic w/u for source of the fall.\  Despite having supple hips she does have evidence of a right femoral neck fracture on x-ray. The patient will be nothing by mouth, she will need to be admitted to the hospital, will discuss with orthopedics, internal medicine. Laceration repaired, 3 sutures placed in the upper lip. Family informed of the plan and they are in agreement.  D/w Dr. Ninfa Linden who will see in consultation - possible surgery tonight D/w Dr. Olevia Bowens who will admit  Meds given in ED:  Medications  Tdap (BOOSTRIX) injection 0.5 mL (0.5 mLs Intramuscular Given 11/30/15 1629)  lidocaine (PF) (XYLOCAINE) 1 % injection 5 mL (5 mLs Intradermal Given 11/30/15 1629)      Noemi Chapel, MD 11/30/15 1910

## 2015-11-30 NOTE — H&P (Signed)
[facility] History and Physical  Kathleen Haynes [id number] DOB: 07-Jul-1927 DOA: 11/30/2015  Referring physician: Noemi Chapel, M.D. PCP: Dwan Bolt, MD   Chief Complaint: Fall.  HPI: Kathleen Haynes is a 82 y.o. female with below past medical history who was brought via EMS to the emergency department after having a fall at home.  Apparently the patient was trying to get out of bed without help from one of her relatives. She felt sustaining injuries to her right eye, nose and mouth. She had her to bottom teeth missing and a laceration across her philtrum.   When seen, the patient was mildly anxious while repeat hip x-ray's were getting done. She subsequently come down with the presence of family members once they images were taken. Workup in the emergency department shows right hip closed fracture.   Review of Systems:  Unable to fully review.  Past Medical History  Diagnosis Date  . Parkinson disease ([place])   . High cholesterol     since 2000  . Hypertension     since 1985  . Back pain     lower back pain , C2 fracture from fall  . Obstructive apnea   . Dementia    Past Surgical History  Procedure Laterality Date  . Shoulder surgery Left     [id number]  . Lumbar spine surgery    . Cervical spine surgery      02/2011, 03/2011  . Rotary cuff Right    Social History:  reports that she has never smoked. She has never used smokeless tobacco. She reports that she does not drink alcohol or use illicit drugs.  No Known Allergies  Family History  Problem Relation Age of Onset  . Cancer Mother   . Congestive Heart Failure Father   . Diabetes Other   . Hypertension Other   . Hyperlipidemia Other   . Heart disease Other     Prior to Admission medications   Medication Sig Start Date End Date Taking? Authorizing Provider  atenolol-chlorthalidone (TENORETIC) 50-25 MG per tablet Take 1 tablet by mouth daily.   Yes Historical Provider, MD    carbidopa-levodopa (SINEMET IR) 25-100 MG tablet Take 1 tablet by mouth 2 (two) times daily. 09/30/15  Yes Star Age, MD  donepezil (ARICEPT) 5 MG tablet Take 1 tablet (5 mg total) by mouth at bedtime. 09/30/15  Yes Star Age, MD  memantine (NAMENDA XR) 14 MG CP24 24 hr capsule Take 1 capsule (14 mg total) by mouth daily. 09/30/15  Yes Star Age, MD  potassium chloride SA (K-DUR,KLOR-CON) 20 MEQ tablet Take 20 mEq by mouth daily.   Yes Historical Provider, MD  pravastatin (PRAVACHOL) 80 MG tablet Take 80 mg by mouth daily.   Yes Historical Provider, MD  traMADol (ULTRAM) 50 MG tablet Take 50 mg by mouth every 6 (six) hours as needed for moderate pain.  09/02/14  Yes Historical Provider, MD  VESICARE 5 MG tablet  04/02/15  Yes Historical Provider, MD   Physical Exam: Filed Vitals:   11/30/15 2015 11/30/15 2030 11/30/15 2233 11/30/15 2309  BP: 155/91 138/82 117/94 131/58  Pulse: 89 85 89 85  Temp:    97.9 F (36.6 C)  TempSrc:      Resp: 18  20 16   Height:    5\' 3"  (1.6 m)  Weight:    40.098 kg (88 lb 6.4 oz)  SpO2: 93% 97% 98% 97%    Wt Readings from Last 3 Encounters:  11/30/15  40.098 kg (88 lb 6.4 oz)  10/20/14 43.908 kg (96 lb 12.8 oz)  09/15/14 45.36 kg (100 lb)    General:  Appears Mildly anxious. Eyes: PERRL, normal lids, irises & conjunctiva. Right periorbital ecchymosis. ENT: Positive sutures on philtrum . Oral mucosa is mildly dry. Neck: no LAD, masses or thyromegaly Cardiovascular: RRR, no m/r/g. No LE edema. Telemetry: SR, no arrhythmias  Respiratory: CTA bilaterally, no w/r/r. Normal respiratory effort. Abdomen: soft, ntnd Skin: Positive ecchymosis/hematoma on right periorbital area. Musculoskeletal: Positive tenderness on right hip. Short tendon right LE. Psychiatric: Disoriented and at times restless. Neurologic: Disoriented. Unable to fully evaluate.           Labs on Admission:  Basic Metabolic Panel:  Recent Labs Lab 11/30/15 1528  NA 138  K 3.9  CL  97*  CO2 27  GLUCOSE 86  BUN 22*  CREATININE 1.18*  CALCIUM 9.7   CBC:  Recent Labs Lab 11/30/15 1528  WBC 6.4  HGB 11.7*  HCT 36.6  MCV 89.1  PLT 344    CBG:  Recent Labs Lab 11/30/15 1640  GLUCAP 69    Radiological Exams on Admission: Dg Pelvis 1-2 Views  11/30/2015  CLINICAL DATA:  [place] today, history of dementia EXAM: PELVIS - 1-2 VIEW COMPARISON:  CT abdomen pelvis 10/15/2014 FINDINGS: There appears to be an acute right femoral neck fracture with some displacement. Additional views may be helpful to evaluate further. The left hip is intact although mild degenerative change involves both hips. The pelvic rami appear intact. Hardware for fusion of the lower lumbar spine is noted. IMPRESSION: Acute right femoral neck fracture. Recommend additional views to evaluate further. Electronically Signed   By: Ivar Drape M.D.   On: 11/30/2015 17:19   Ct Head Wo Contrast  11/30/2015  CLINICAL DATA:  Fall today EXAM: CT HEAD WITHOUT CONTRAST TECHNIQUE: Contiguous axial images were obtained from the base of the skull through the vertex without intravenous contrast. COMPARISON:  10/15/2014 FINDINGS: Global atrophy. Encephalomalacia in the bilateral frontal lobes. Chronic ischemic changes in the periventricular white matter. No mass effect, midline shift, or acute hemorrhage. Soft tissue swelling over the right frontal bone. There is gas in the subcutaneous tissues of unknown significance. Old left zygomatic arch fracture. Mastoid air cells are clear. Visualized paranasal sinuses are clear. Chronic odontoid IMPRESSION: No acute intracranial pathology. Soft tissue swelling containing gas over the right frontal bone is noted. Electronically Signed   By: Marybelle Killings M.D.   On: 11/30/2015 17:43   Dg Chest Port 1 View  11/30/2015  CLINICAL DATA:  Preoperative examination for patient with a right hip fracture which occurred today. EXAM: PORTABLE CHEST 1 VIEW COMPARISON:  PA and lateral chest  06/03/2015. FINDINGS: The lungs are clear. Heart size is normal. No pneumothorax or pleural effusion. No focal bony abnormality. IMPRESSION: No acute disease. Electronically Signed   By: Inge Rise M.D.   On: 11/30/2015 20:04   Dg Hip Unilat With Pelvis 2-3 Views Right  11/30/2015  CLINICAL DATA:  82 year old who fell today and injured the right hip. EXAM: DG HIP (WITH OR WITHOUT PELVIS) 2-3V RIGHT COMPARISON:  AP pelvis x-ray obtained earlier same day 1653 hr. FINDINGS: Acute intertrochanteric femoral neck fracture as suggested on the prior AP pelvis examination. Hip joint intact with mild-to-moderate inferomedial joint space narrowing. Osseous demineralization. No other visible fractures. IMPRESSION: Acute traumatic intertrochanteric right femoral neck fracture. Osseous demineralization. Mild to moderate osteoarthritis. Electronically Signed   By: Marcello Moores  Lawrence M.D.   On: 11/30/2015 20:06    EKG: Independently reviewed. Vent. rate 83 BPM PR interval 183 ms QRS duration 72 ms QT/QTc 426/501 ms P-R-T axes 79 12 106 Sinus rhythm Nonspecific T abnrm, anterolateral leads Prolonged QT interval  Assessment/Plan Principal Problem:   Closed right hip fracture (HCC) Admit to MedSurg/inpatient. Pain management as needed. Orthopedic surgery evaluation. Consult social services and case management. Consult PT/OT.  Active Problems:   Dementia due to Parkinson's disease without behavioral disturbance ([place]) Continue Namenda and donepezil. Continue Sinemet. Supportive care.    Essential hypertension Continue Tenoretic. Monitor blood pressure periodically.    Hyperlipidemia Continue pravastatin. Monitor LFTs periodically.   Orthopedic surgery is consulting.  Code Status: Full code. DVT Prophylaxis: Heparin SQ. Family Communication: Her daughter and husband were present in the room. Disposition Plan: Admit for further management and orthopedic surgery evaluation.  Time spent:  Over 70 minutes were spent in the process of this admission.  Kathleen Haynes, M.D. [facility] Pager [phone].

## 2015-11-30 NOTE — Consult Note (Signed)
Reason for Consult:  Right hip fracture Referring Physician:   Dr. Miller, EDP  Kathleen Haynes is an 82 y.o. female.  HPI:   82 yo female with dementia who lives at home with her husband sustained a mechanical fall.  From an Orthopedic standpoint, was found to have a right hip fracture and I was contacted for further assessment, evaluation, and treatment recommendations. Her daughter and husband are at the bedside and said she was able to stand and get into her wheelchair after they found her down and they were able to transport her.  Past Medical History  Diagnosis Date  . Parkinson disease (HCC)   . High cholesterol     since 2000  . Hypertension     since 1985  . Back pain     lower back pain , C2 fracture from fall  . Obstructive apnea   . Dementia     Past Surgical History  Procedure Laterality Date  . Shoulder surgery Left     1999,2001  . Lumbar spine surgery    . Cervical spine surgery      02/2011, 03/2011  . Rotary cuff Right     Family History  Problem Relation Age of Onset  . Cancer Mother   . Congestive Heart Failure Father   . Diabetes Other   . Hypertension Other   . Hyperlipidemia Other   . Heart disease Other     Social History:  reports that she has never smoked. She has never used smokeless tobacco. She reports that she does not drink alcohol or use illicit drugs.  Allergies: No Known Allergies  Medications: I have reviewed the patient's current medications.  Results for orders placed or performed during the hospital encounter of 11/30/15 (from the past 48 hour(s))  Basic metabolic panel     Status: Abnormal   Collection Time: 11/30/15  3:28 PM  Result Value Ref Range   Sodium 138 135 - 145 mmol/L   Potassium 3.9 3.5 - 5.1 mmol/L   Chloride 97 (L) 101 - 111 mmol/L   CO2 27 22 - 32 mmol/L   Glucose, Bld 86 65 - 99 mg/dL   BUN 22 (H) 6 - 20 mg/dL   Creatinine, Ser 1.18 (H) 0.44 - 1.00 mg/dL   Calcium 9.7 8.9 - 10.3 mg/dL   GFR calc non Af  Amer 41 (L) >60 mL/min   GFR calc Af Amer 47 (L) >60 mL/min    Comment: (NOTE) The eGFR has been calculated using the CKD EPI equation. This calculation has not been validated in all clinical situations. eGFR's persistently <60 mL/min signify possible Chronic Kidney Disease.    Anion gap 14 5 - 15  CBC     Status: Abnormal   Collection Time: 11/30/15  3:28 PM  Result Value Ref Range   WBC 6.4 4.0 - 10.5 K/uL   RBC 4.11 3.87 - 5.11 MIL/uL   Hemoglobin 11.7 (L) 12.0 - 15.0 g/dL   HCT 36.6 36.0 - 46.0 %   MCV 89.1 78.0 - 100.0 fL   MCH 28.5 26.0 - 34.0 pg   MCHC 32.0 30.0 - 36.0 g/dL   RDW 14.3 11.5 - 15.5 %   Platelets 344 150 - 400 K/uL  CBG monitoring, ED     Status: None   Collection Time: 11/30/15  4:40 PM  Result Value Ref Range   Glucose-Capillary 69 65 - 99 mg/dL  Urinalysis, Routine w reflex microscopic (not at [facility])       Status: Abnormal   Collection Time: 11/30/15  5:00 PM  Result Value Ref Range   Color, Urine AMBER (A) YELLOW    Comment: BIOCHEMICALS MAY BE AFFECTED BY COLOR   APPearance CLEAR CLEAR   Specific Gravity, Urine 1.019 1.005 - 1.030   pH 6.0 5.0 - 8.0   Glucose, UA NEGATIVE NEGATIVE mg/dL   Hgb urine dipstick NEGATIVE NEGATIVE   Bilirubin Urine NEGATIVE NEGATIVE   Ketones, ur 15 (A) NEGATIVE mg/dL   Protein, ur NEGATIVE NEGATIVE mg/dL   Nitrite NEGATIVE NEGATIVE   Leukocytes, UA NEGATIVE NEGATIVE    Comment: MICROSCOPIC NOT DONE ON URINES WITH NEGATIVE PROTEIN, BLOOD, LEUKOCYTES, NITRITE, OR GLUCOSE <1000 mg/dL.    Dg Pelvis 1-2 Views  11/30/2015  CLINICAL DATA:  Fell today, history of dementia EXAM: PELVIS - 1-2 VIEW COMPARISON:  CT abdomen pelvis 10/15/2014 FINDINGS: There appears to be an acute right femoral neck fracture with some displacement. Additional views may be helpful to evaluate further. The left hip is intact although mild degenerative change involves both hips. The pelvic rami appear intact. Hardware for fusion of the lower lumbar spine  is noted. IMPRESSION: Acute right femoral neck fracture. Recommend additional views to evaluate further. Electronically Signed   By: Paul  Barry M.D.   On: 11/30/2015 17:19   Ct Head Wo Contrast  11/30/2015  CLINICAL DATA:  Fall today EXAM: CT HEAD WITHOUT CONTRAST TECHNIQUE: Contiguous axial images were obtained from the base of the skull through the vertex without intravenous contrast. COMPARISON:  10/15/2014 FINDINGS: Global atrophy. Encephalomalacia in the bilateral frontal lobes. Chronic ischemic changes in the periventricular white matter. No mass effect, midline shift, or acute hemorrhage. Soft tissue swelling over the right frontal bone. There is gas in the subcutaneous tissues of unknown significance. Old left zygomatic arch fracture. Mastoid air cells are clear. Visualized paranasal sinuses are clear. Chronic odontoid IMPRESSION: No acute intracranial pathology. Soft tissue swelling containing gas over the right frontal bone is noted. Electronically Signed   By: Arthur  Hoss M.D.   On: 11/30/2015 17:43    ROS Blood pressure 142/75, pulse 85, temperature 98.8 F (37.1 C), temperature source Oral, resp. rate 20, SpO2 97 %. Physical Exam  Constitutional: She appears well-developed.  Neck: Normal range of motion. Neck supple.  Cardiovascular: Normal rate.   Respiratory: Effort normal and breath sounds normal.  GI: Soft. Bowel sounds are normal.  Musculoskeletal:       Right hip: She exhibits bony tenderness.       Legs: Neurological: She is alert.  Skin: Skin is warm.    Assessment/Plan: Right greater trochanteric avulsion fracture 1)  Fortunately, this is a non-operative fracture.  She is a minimal ambulator and should basically put weight on her right leg just to get into her wheelchair.  , Y 11/30/2015, 7:50 PM      

## 2015-12-01 DIAGNOSIS — I10 Essential (primary) hypertension: Secondary | ICD-10-CM | POA: Diagnosis present

## 2015-12-01 DIAGNOSIS — E785 Hyperlipidemia, unspecified: Secondary | ICD-10-CM | POA: Diagnosis present

## 2015-12-01 DIAGNOSIS — S72001A Fracture of unspecified part of neck of right femur, initial encounter for closed fracture: Principal | ICD-10-CM

## 2015-12-01 LAB — COMPREHENSIVE METABOLIC PANEL
ALK PHOS: 219 U/L — AB (ref 38–126)
ALT: 5 U/L — AB (ref 14–54)
AST: 31 U/L (ref 15–41)
Albumin: 3.2 g/dL — ABNORMAL LOW (ref 3.5–5.0)
Anion gap: 17 — ABNORMAL HIGH (ref 5–15)
BUN: 20 mg/dL (ref 6–20)
CALCIUM: 9.6 mg/dL (ref 8.9–10.3)
CO2: 24 mmol/L (ref 22–32)
CREATININE: 1.09 mg/dL — AB (ref 0.44–1.00)
Chloride: 99 mmol/L — ABNORMAL LOW (ref 101–111)
GFR, EST AFRICAN AMERICAN: 52 mL/min — AB (ref >60)
GFR, EST NON AFRICAN AMERICAN: 45 mL/min — AB (ref >60)
Glucose, Bld: 66 mg/dL (ref 65–99)
Potassium: 3.5 mmol/L (ref 3.5–5.1)
Sodium: 140 mmol/L (ref 135–145)
Total Bilirubin: 0.8 mg/dL (ref 0.3–1.2)
Total Protein: 7 g/dL (ref 6.5–8.1)

## 2015-12-01 LAB — CBC WITH DIFFERENTIAL/PLATELET
Basophils Absolute: 0.1 10*3/uL (ref 0.0–0.1)
Basophils Relative: 1 %
Eosinophils Absolute: 1.1 10*3/uL — ABNORMAL HIGH (ref 0.0–0.7)
Eosinophils Relative: 13 %
HCT: 36.5 % (ref 36.0–46.0)
HEMOGLOBIN: 11.9 g/dL — AB (ref 12.0–15.0)
LYMPHS PCT: 36 %
Lymphs Abs: 3.1 10*3/uL (ref 0.7–4.0)
MCH: 29.1 pg (ref 26.0–34.0)
MCHC: 32.6 g/dL (ref 30.0–36.0)
MCV: 89.2 fL (ref 78.0–100.0)
Monocytes Absolute: 1 10*3/uL (ref 0.1–1.0)
Monocytes Relative: 12 %
NEUTROS PCT: 38 %
Neutro Abs: 3.4 10*3/uL (ref 1.7–7.7)
Platelets: 339 10*3/uL (ref 150–400)
RBC: 4.09 MIL/uL (ref 3.87–5.11)
RDW: 14.4 % (ref 11.5–15.5)
WBC: 8.6 10*3/uL (ref 4.0–10.5)

## 2015-12-01 LAB — PROTIME-INR
INR: 1.26 (ref 0.00–1.49)
PROTHROMBIN TIME: 15.9 s — AB (ref 11.6–15.2)

## 2015-12-01 MED ORDER — BISACODYL 10 MG RE SUPP
10.0000 mg | Freq: Every day | RECTAL | Status: DC
  Administered 2015-12-01: 10 mg via RECTAL
  Filled 2015-12-01: qty 1

## 2015-12-01 MED ORDER — METOCLOPRAMIDE HCL 5 MG/ML IJ SOLN: 10.0000 mg | Freq: Three times a day (TID) | INTRAMUSCULAR | Status: DC | PRN

## 2015-12-01 MED ORDER — DOCUSATE SODIUM 100 MG PO CAPS
200.0000 mg | ORAL_CAPSULE | Freq: Two times a day (BID) | ORAL | Status: DC
  Administered 2015-12-01 – 2015-12-02 (×3): 200 mg via ORAL
  Filled 2015-12-01 (×3): qty 2

## 2015-12-01 MED ORDER — MAGNESIUM SULFATE IN D5W 10-5 MG/ML-% IV SOLN
1.0000 g | Freq: Once | INTRAVENOUS | Status: AC
Start: 2015-12-01 — End: 2015-12-01
  Administered 2015-12-01: 1 g via INTRAVENOUS
  Filled 2015-12-01: qty 100

## 2015-12-01 MED ORDER — POLYETHYLENE GLYCOL 3350 17 G PO PACK
17.0000 g | PACK | Freq: Two times a day (BID) | ORAL | Status: DC
  Administered 2015-12-01 – 2015-12-02 (×3): 17 g via ORAL
  Filled 2015-12-01 (×3): qty 1

## 2015-12-01 MED ORDER — HYDROCODONE-ACETAMINOPHEN 5-325 MG PO TABS: 1.0000 | ORAL_TABLET | Freq: Four times a day (QID) | ORAL | Status: DC | PRN

## 2015-12-01 MED ORDER — HALOPERIDOL LACTATE 5 MG/ML IJ SOLN: 2.0000 mg | Freq: Once | INTRAMUSCULAR | Status: AC

## 2015-12-01 MED ORDER — HALOPERIDOL 2 MG PO TABS
2.0000 mg | ORAL_TABLET | Freq: Once | ORAL | Status: AC
  Administered 2015-12-01: 2 mg via ORAL
  Filled 2015-12-01: qty 1

## 2015-12-01 NOTE — Progress Notes (Addendum)
TRH Progress Note                                                                                                                                                                                                                      Patient Demographics:    Kathleen Haynes, is a 82 y.o. female, DOB - October 29, 1926, [id number]  Admit date - 11/30/2015   Admitting Physician Reubin Milan, MD  Outpatient Primary MD for the patient is Dwan Bolt, MD  LOS - 1  Outpatient Specialists:   Chief Complaint  Patient presents with  . Fall        Subjective:    Kathleen Haynes today has, No headache, No chest pain, No abdominal pain - No Nausea, No new weakness tingling or numbness, No Cough - SOB.    Assessment  & Plan :      1. Mechanical fall from the bed causing right scalp hematoma, lip laceration and hematoma which was sutured in the ER, right femoral neck fracture. Seen by ER physician from trauma standpoint and cleared, seen by orthopedic surgeon Dr. Ninfa Linden discussed the case with him. No surgical intervention. Weightbearing and activity as tolerated, supportive care with pain control, PT eval likely discharge in the morning to home with home health PT. Family agreeable with the plan.   2. Parkinson's disease with underlying dementia. Remains at risk for delirium. Family explained. Home medications to be continued. Will minimize narcotics and benzodiazepines such as possible.  3. Mildly prolonged QTC. 1 g magnesium, beta blocker. Avoid QT prolonging agents.  4. Constipation. Placed on bowel regimen.  5. Dyslipidemia. On statin continue.    Code Status : Full  Family Communication  : sister  Disposition Plan  : HHPT 12-02-15  Barriers For Discharge : PT  Consults  :  Ortho  Procedures  :    DVT Prophylaxis  :    SCDs   Lab Results  Component  Value Date   PLT 339 12/01/2015    Antibiotics  :     Anti-infectives    None        Objective:   Filed Vitals:   11/30/15 2309 12/01/15 0235 12/01/15 0440 12/01/15 0546  BP: 131/58 124/63 122/68 105/51  Pulse: 85 80 81 79  Temp: 97.9 F (36.6 C) 98.2 F (36.8 C) 98.3 F (36.8 C) 98 F (36.7 C)  TempSrc:   Oral   Resp: 16 16 17 18   Height: 5\' 3"  (1.6 m)  Weight: 40.098 kg (88 lb 6.4 oz)   40.3 kg (88 lb 13.5 oz)  SpO2: 97% 98% 98% 100%    Wt Readings from Last 3 Encounters:  12/01/15 40.3 kg (88 lb 13.5 oz)  10/20/14 43.908 kg (96 lb 12.8 oz)  09/15/14 45.36 kg (100 lb)     Intake/Output Summary (Last 24 hours) at 12/01/15 1201 Last data filed at 12/01/15 0600  Gross per 24 hour  Intake    120 ml  Output      0 ml  Net    120 ml     Physical Exam  Awake But pleasantly confused, No new F.N deficits, Normal affect Bear.AT,PERRAL Supple Neck,No JVD, No cervical lymphadenopathy appriciated.  Symmetrical Chest wall movement, Good air movement bilaterally, CTAB RRR,No Gallops,Rubs or new Murmurs, No Parasternal Heave +ve B.Sounds, Abd Soft, No tenderness, No organomegaly appriciated, No rebound - guarding or rigidity. No Cyanosis, Clubbing or edema, No new Rash or bruise Does have right frontal bruise and bruise over her lip    Data Review:    CBC  Recent Labs Lab 11/30/15 1528 12/01/15 0605  WBC 6.4 8.6  HGB 11.7* 11.9*  HCT 36.6 36.5  PLT 344 339  MCV 89.1 89.2  MCH 28.5 29.1  MCHC 32.0 32.6  RDW 14.3 14.4  LYMPHSABS  --  3.1  MONOABS  --  1.0  EOSABS  --  1.1*  BASOSABS  --  0.1    Chemistries   Recent Labs Lab 11/30/15 1528 12/01/15 0605  NA 138 140  K 3.9 3.5  CL 97* 99*  CO2 27 24  GLUCOSE 86 66  BUN 22* 20  CREATININE 1.18* 1.09*  CALCIUM 9.7 9.6  AST  --  31  ALT  --  5*  ALKPHOS  --  219*  BILITOT  --  0.8    ------------------------------------------------------------------------------------------------------------------ No results for input(s): CHOL, HDL, LDLCALC, TRIG, CHOLHDL, LDLDIRECT in the last 72 hours.  No results found for: HGBA1C ------------------------------------------------------------------------------------------------------------------ No results for input(s): TSH, T4TOTAL, T3FREE, THYROIDAB in the last 72 hours.  Invalid input(s): FREET3 ------------------------------------------------------------------------------------------------------------------ No results for input(s): VITAMINB12, FOLATE, FERRITIN, TIBC, IRON, RETICCTPCT in the last 72 hours.  Coagulation profile  Recent Labs Lab 12/01/15 0605  INR 1.26    No results for input(s): DDIMER in the last 72 hours.  Cardiac Enzymes No results for input(s): CKMB, TROPONINI, MYOGLOBIN in the last 168 hours.  Invalid input(s): CK ------------------------------------------------------------------------------------------------------------------ No results found for: BNP  Inpatient Medications  Scheduled Meds: . atenolol  50 mg Oral Daily  . carbidopa-levodopa  1 tablet Oral BID  . darifenacin  7.5 mg Oral Daily  . donepezil  5 mg Oral QHS  . heparin  5,000 Units Subcutaneous 3 times per day  . memantine  14 mg Oral Daily  . potassium chloride SA  20 mEq Oral Daily  . pravastatin  80 mg Oral q1800   Continuous Infusions:  PRN Meds:.HYDROcodone-acetaminophen, metoCLOPramide (REGLAN) injection, morphine injection, traMADol  Micro Results No results found for this or any previous visit (from the past 240 hour(s)).  Radiology Reports Dg Pelvis 1-2 Views  11/30/2015  CLINICAL DATA:  [place] today, history of dementia EXAM: PELVIS - 1-2 VIEW COMPARISON:  CT abdomen pelvis 10/15/2014 FINDINGS: There appears to be an acute right femoral neck fracture with some displacement. Additional views may be helpful to evaluate  further. The left hip is intact although mild degenerative change involves both hips. The pelvic rami appear intact.  Hardware for fusion of the lower lumbar spine is noted. IMPRESSION: Acute right femoral neck fracture. Recommend additional views to evaluate further. Electronically Signed   By: Ivar Drape M.D.   On: 11/30/2015 17:19   Ct Head Wo Contrast  11/30/2015  CLINICAL DATA:  Fall today EXAM: CT HEAD WITHOUT CONTRAST TECHNIQUE: Contiguous axial images were obtained from the base of the skull through the vertex without intravenous contrast. COMPARISON:  10/15/2014 FINDINGS: Global atrophy. Encephalomalacia in the bilateral frontal lobes. Chronic ischemic changes in the periventricular white matter. No mass effect, midline shift, or acute hemorrhage. Soft tissue swelling over the right frontal bone. There is gas in the subcutaneous tissues of unknown significance. Old left zygomatic arch fracture. Mastoid air cells are clear. Visualized paranasal sinuses are clear. Chronic odontoid IMPRESSION: No acute intracranial pathology. Soft tissue swelling containing gas over the right frontal bone is noted. Electronically Signed   By: Marybelle Killings M.D.   On: 11/30/2015 17:43   Dg Chest Port 1 View  11/30/2015  CLINICAL DATA:  Preoperative examination for patient with a right hip fracture which occurred today. EXAM: PORTABLE CHEST 1 VIEW COMPARISON:  PA and lateral chest 06/03/2015. FINDINGS: The lungs are clear. Heart size is normal. No pneumothorax or pleural effusion. No focal bony abnormality. IMPRESSION: No acute disease. Electronically Signed   By: Inge Rise M.D.   On: 11/30/2015 20:04   Dg Hip Unilat With Pelvis 2-3 Views Right  11/30/2015  CLINICAL DATA:  82 year old who fell today and injured the right hip. EXAM: DG HIP (WITH OR WITHOUT PELVIS) 2-3V RIGHT COMPARISON:  AP pelvis x-ray obtained earlier same day 1653 hr. FINDINGS: Acute intertrochanteric femoral neck fracture as suggested on the prior  AP pelvis examination. Hip joint intact with mild-to-moderate inferomedial joint space narrowing. Osseous demineralization. No other visible fractures. IMPRESSION: Acute traumatic intertrochanteric right femoral neck fracture. Osseous demineralization. Mild to moderate osteoarthritis. Electronically Signed   By: Evangeline Dakin M.D.   On: 11/30/2015 20:06    Time Spent in minutes  35   Johnni Wunschel K M.D on 12/01/2015 at 12:01 PM  Between 7am to 7pm - Pager - [phone]  After 7pm go to [email or web address] - password [facility]  [facility] -  Office  [phone]

## 2015-12-01 NOTE — Progress Notes (Signed)
Initial Nutrition Assessment  DOCUMENTATION CODES:   Severe malnutrition in context of chronic illness, Underweight  INTERVENTION:   -Ensure Enlive po BID, each supplement provides 350 kcal and 20 grams of protein  NUTRITION DIAGNOSIS:   Malnutrition related to chronic illness as evidenced by severe depletion of muscle mass, severe depletion of body fat.  GOAL:   Patient will meet greater than or equal to 90% of their needs  MONITOR:   PO intake, Supplement acceptance, Labs, Weight trends, Skin, I & O's  REASON FOR ASSESSMENT:   Consult Assessment of nutrition requirement/status  ASSESSMENT:   82 yo female with dementia who lives at home with her husband sustained a mechanical fall. From an Orthopedic standpoint, was found to have a right hip fracture and I was contacted for further assessment, evaluation, and treatment recommendations. Her daughter and husband are at the bedside and said she was able to stand and get into her wheelchair after they found her down and they were able to transport her.  Pt admitted with right greater trochanteric avulsion fracture. Per orthopedics note, pt will undergo non-operative management.   Hx obtained from pt husband and pt sister at bedside. Pt husband reports that it has been increasingly difficult to feed pt, as she often refuses food due to lack of hunger. Pt husband reports pt consumes 2 meals per day (breakfast: grits, eggs, and toast and second meal of soup). Pt husband also shares that he provides pt Ensure daily with medications, which she takes well.   Pt husband reports UBW is around 100#, but is unable to provide further details on weight loss. Noted no weight readings have been obtained over the past year.   Nutrition-Focused physical exam completed. Findings are severe fat depletion, severe muscle depletion, and no edema.   Discussed with pt husband importance of good po intake to promote healing. Also encouraged to continue  to provide Ensure supplement to optimize nutritional intake.  Labs reviewed.   Diet Order:  DIET SOFT Room service appropriate?: Yes; Fluid consistency:: Thin  Skin:  Reviewed, no issues  Last BM:  12/01/15  Height:   Ht Readings from Last 1 Encounters:  11/30/15 5\' 3"  (1.6 m)    Weight:   Wt Readings from Last 1 Encounters:  12/01/15 88 lb 13.5 oz (40.3 kg)    Ideal Body Weight:  52.3 kg  BMI:  Body mass index is 15.74 kg/(m^2).  Estimated Nutritional Needs:   Kcal:  1200-1400  Protein:  50-65 grams  Fluid:  1.2-1.4 L  EDUCATION NEEDS:   Education needs addressed  Cornella Emmer A. Jimmye Norman, RD, LDN, CDE Pager: [phone] After hours Pager: [phone]

## 2015-12-01 NOTE — Progress Notes (Signed)
Pt has dementia and not cooperative for scheduled neuro check. Will continue to monitor

## 2015-12-01 NOTE — Progress Notes (Addendum)
New Admission Note:   Arrival Method: ED tech Mental Orientation: alert to person only Telemetry: Box 7 Assessment: Completed Skin:Bruising right forehead, 3 stiches on frenulum, bruising bilateral extremities IV: 22LW Safety Measures: Safety Fall Prevention Plan has been given, discussed with spouse Admission: Completed [address] Orientation: Patient/family has been orientated to the room, unit and staff.  Family:  Orders have been reviewed and implemented. Will continue to monitor the patient. Call light has been placed within reach and bed alarm has been activated.   Larwance Rote Caprice Beaver  BSN, RN  Phone number: [phone]

## 2015-12-02 ENCOUNTER — Other Ambulatory Visit: Payer: Self-pay

## 2015-12-02 LAB — COMPREHENSIVE METABOLIC PANEL
ALBUMIN: 3.1 g/dL — AB (ref 3.5–5.0)
ALT: 8 U/L — ABNORMAL LOW (ref 14–54)
ANION GAP: 17 — AB (ref 5–15)
AST: 29 U/L (ref 15–41)
Alkaline Phosphatase: 211 U/L — ABNORMAL HIGH (ref 38–126)
BILIRUBIN TOTAL: 0.9 mg/dL (ref 0.3–1.2)
BUN: 21 mg/dL — AB (ref 6–20)
CHLORIDE: 98 mmol/L — AB (ref 101–111)
CO2: 24 mmol/L (ref 22–32)
Calcium: 9.6 mg/dL (ref 8.9–10.3)
Creatinine, Ser: 1.16 mg/dL — ABNORMAL HIGH (ref 0.44–1.00)
GFR calc Af Amer: 48 mL/min — ABNORMAL LOW (ref >60)
GFR, EST NON AFRICAN AMERICAN: 41 mL/min — AB (ref >60)
Glucose, Bld: 57 mg/dL — ABNORMAL LOW (ref 65–99)
POTASSIUM: 3.6 mmol/L (ref 3.5–5.1)
Sodium: 139 mmol/L (ref 135–145)
TOTAL PROTEIN: 6.8 g/dL (ref 6.5–8.1)

## 2015-12-02 LAB — CBC WITH DIFFERENTIAL/PLATELET
BASOS ABS: 0.1 10*3/uL (ref 0.0–0.1)
BASOS PCT: 1 %
EOS PCT: 14 %
Eosinophils Absolute: 0.8 10*3/uL — ABNORMAL HIGH (ref 0.0–0.7)
HCT: 36.5 % (ref 36.0–46.0)
Hemoglobin: 12.3 g/dL (ref 12.0–15.0)
Lymphocytes Relative: 25 %
Lymphs Abs: 1.4 10*3/uL (ref 0.7–4.0)
MCH: 29.7 pg (ref 26.0–34.0)
MCHC: 33.7 g/dL (ref 30.0–36.0)
MCV: 88.2 fL (ref 78.0–100.0)
MONO ABS: 0.7 10*3/uL (ref 0.1–1.0)
Monocytes Relative: 12 %
Neutro Abs: 2.9 10*3/uL (ref 1.7–7.7)
Neutrophils Relative %: 48 %
PLATELETS: 294 10*3/uL (ref 150–400)
RBC: 4.14 MIL/uL (ref 3.87–5.11)
RDW: 14.4 % (ref 11.5–15.5)
WBC: 5.9 10*3/uL (ref 4.0–10.5)

## 2015-12-02 LAB — GLUCOSE, CAPILLARY: GLUCOSE-CAPILLARY: 201 mg/dL — AB (ref 65–99)

## 2015-12-02 LAB — PROTIME-INR
INR: 1.17 (ref 0.00–1.49)
PROTHROMBIN TIME: 15.1 s (ref 11.6–15.2)

## 2015-12-02 MED ORDER — TRAMADOL HCL 50 MG PO TABS: 50.0000 mg | ORAL_TABLET | Freq: Four times a day (QID) | ORAL | Status: DC | PRN

## 2015-12-02 MED ORDER — SODIUM CHLORIDE 0.9 % IV BOLUS (SEPSIS)
1000.0000 mL | Freq: Once | INTRAVENOUS | Status: AC
  Administered 2015-12-02: 1000 mL via INTRAVENOUS

## 2015-12-02 MED ORDER — SODIUM CHLORIDE 0.9 % IV BOLUS (SEPSIS): 500.0000 mL | Freq: Once | INTRAVENOUS | Status: DC

## 2015-12-02 MED ORDER — DEXTROSE 50 % IV SOLN
50.0000 mL | Freq: Once | INTRAVENOUS | Status: AC
  Administered 2015-12-02: 50 mL via INTRAVENOUS
  Filled 2015-12-02: qty 50

## 2015-12-02 MED ORDER — POLYETHYLENE GLYCOL 3350 17 G PO PACK: 17.0000 g | PACK | Freq: Two times a day (BID) | ORAL | Status: AC

## 2015-12-02 NOTE — Consult Note (Signed)
   [facility] Inpatient Consult   12/02/2015  KERRIGAN GOMBOS May 18, 1927 [id number] Consult received for [name] Care Management with her [facility] Medicare Complete.  Met with the patient and husband, [name], regarding [place] management services.  Patient was admitted with a mechanical fall from the bed causing right scalp hematoma, lip laceration and hematoma. right femoral neck fracture. Weightbearing and activity as tolerated, supportive care with pain control, Patient will discharge  to home with home health PT.  Patient is wheelchair bound with a HX of Parkinson's Disease. Consult received for community support and follow up.  Husband agreeable to post hospital monitoring.  Consent signed. Inpatient RNCM notified of consent for follow up monitoring.  [facility] Care Management does not interfere with any services arranged by the inpatient care management services.  Patient will received post hospital follow up calls and be evaluated for home visits.  For questions, please contact: Natividad Brood, RN BSN [facility] Liaison  [phone] business mobile phone Toll free office [phone]

## 2015-12-02 NOTE — Progress Notes (Signed)
Patient for discharge with no acute distress noted. Medications and discharge instructions explained to the patient's husband. He verbalized understanding. Copies given to him including original prescription. IV saline lock and tele pack removed. Monitor tech notified.

## 2015-12-02 NOTE — Evaluation (Signed)
Physical Therapy Evaluation Patient Details Name: Kathleen Haynes MRN: [id number] DOB: Nov 05, 1926 Today's Date: 12/02/2015   History of Present Illness  Pt adm after fall with rt hip fx. Nonsurgical management. PMH - Parkinson's, dementia  Clinical Impression  Pt admitted with above diagnosis and presents to PT with functional limitations due to deficits listed below (See PT problem list). Pt needs skilled PT to maximize independence and safety to allow discharge to home with family if they are able to provide needed assist. Appears from chart that pt has been requiring physical assist prior to this admission.    Follow Up Recommendations Home health PT;Supervision/Assistance - 24 hour (Unless family cannot provide needed assist.)    Equipment Recommendations  None recommended by PT    Recommendations for Other Services       Precautions / Restrictions Precautions Precautions: Fall Restrictions Weight Bearing Restrictions: Yes RLE Weight Bearing: Weight bearing as tolerated      Mobility  Bed Mobility Overal bed mobility: Needs Assistance Bed Mobility: Supine to Sit     Supine to sit: Max assist     General bed mobility comments: Assist to bring legs off bed and elevating trunk into sitting  Transfers Overall transfer level: Needs assistance Equipment used: 1 person hand held assist Transfers: Sit to/from Stand Sit to Stand: Mod assist         General transfer comment: Assist to bring hips up. Pt held my forearms to perform pivot.  Ambulation/Gait                Stairs            Wheelchair Mobility    Modified Rankin (Stroke Patients Only)       Balance Overall balance assessment: Needs assistance Sitting-balance support: Feet supported;Bilateral upper extremity supported Sitting balance-Leahy Scale: Poor Sitting balance - Comments: Needed UE support to sit EOB with min guard assist   Standing balance support: Bilateral upper extremity  supported Standing balance-Leahy Scale: Poor                               Pertinent Vitals/Pain Pain Assessment: Faces Faces Pain Scale: Hurts little more Pain Location: Neck Pain Descriptors / Indicators: Grimacing Pain Intervention(s): Limited activity within patient's tolerance;Repositioned    Home Living Family/patient expects to be discharged to:: Private residence Living Arrangements: Spouse/significant other;Children Available Help at Discharge: Family           Home Equipment: Gilford Rile - 2 wheels;Wheelchair - manual Additional Comments: Per chart    Prior Function Level of Independence: Needs assistance   Gait / Transfers Assistance Needed: Per chart pt not ambulating and only transferring to w/c with assist.           Hand Dominance   Dominant Hand: Right    Extremity/Trunk Assessment   Upper Extremity Assessment: Generalized weakness           Lower Extremity Assessment: Generalized weakness         Communication      Cognition Arousal/Alertness: Lethargic Behavior During Therapy: WFL for tasks assessed/performed Overall Cognitive Status: No family/caregiver present to determine baseline cognitive functioning                      General Comments      Exercises        Assessment/Plan    PT Assessment Patient needs continued PT services  PT Diagnosis  Generalized weakness;Difficulty walking   PT Problem List Decreased strength;Decreased activity tolerance;Decreased balance;Decreased mobility  PT Treatment Interventions DME instruction;Functional mobility training;Therapeutic activities;Therapeutic exercise;Balance training;Patient/family education   PT Goals (Current goals can be found in the Care Plan section) Acute Rehab PT Goals Patient Stated Goal: Pt unable PT Goal Formulation: Patient unable to participate in goal setting Time For Goal Achievement: 12/09/15 Potential to Achieve Goals: Good    Frequency  Min 3X/week   Barriers to discharge        Co-evaluation               End of Session Equipment Utilized During Treatment: Gait belt Activity Tolerance: Patient tolerated treatment well Patient left: in chair;with call bell/phone within reach;with chair alarm set Nurse Communication: Mobility status         Time: [id number] PT Time Calculation (min) (ACUTE ONLY): 18 min   Charges:   PT Evaluation $PT Eval Moderate Complexity: 1 Procedure     PT G Codes:        [place] 12-11-2015, 12:25 PM [facility] PT [phone]

## 2015-12-02 NOTE — Care Management Note (Addendum)
Case Management Note  Patient Details  Name: Kathleen Haynes MRN: [id number] Date of Birth: 1926/12/26  Subjective/Objective:             Admitted with closed R hip fx.       Action/Plan: Plan to d/c to home today with home health services.  Expected Discharge Date:    12/02/2015           Expected Discharge Plan:  [place]  In-House Referral:     Discharge planning Services  CM Consult  Post Acute Care Choice:    Choice offered to:  Patient, Spouse  DME Arranged:    DME Agency:     HH Arranged:  RN, PT, Nurse's Aide, Social Work [place] Agency:  [place]  Status of Service:  Completed, signed off  Additional Comments:  CM made referral with [facility]([name] [phone]) [place] for RN,PT, NA, and SW.  Whitman Hero [place], [place] [phone] 12/02/2015, 12:19 PM

## 2015-12-02 NOTE — Discharge Summary (Signed)
Kathleen Haynes, is a 82 y.o. female  DOB 08-27-1927  MRN [id number].  Admission date:  11/30/2015  Admitting Physician  Reubin Milan, MD  Discharge Date:  12/02/2015   Primary MD  Dwan Bolt, MD  Recommendations for primary care physician for things to follow:   CBC, BMP next visit. Monitor clinically. Monitor liver laceration, right scalp bruise   Admission Diagnosis  Fracture [T14.8] Lip laceration, initial encounter [S01.511A] Hip fracture, right, closed, initial encounter ([place]) [S72.001A]   Discharge Diagnosis  Fracture [T14.8] Lip laceration, initial encounter [S01.511A] Hip fracture, right, closed, initial encounter ([place]) [S72.001A]    Principal Problem:   Closed right hip fracture ([place]) Active Problems:   Dementia due to Parkinson's disease without behavioral disturbance ([place])   Essential hypertension   Hyperlipidemia      Past Medical History  Diagnosis Date  . Parkinson disease ([place])   . High cholesterol     since 2000  . Hypertension     since 1985  . Back pain     lower back pain , C2 fracture from fall  . Obstructive apnea   . Dementia     Past Surgical History  Procedure Laterality Date  . Shoulder surgery Left     [id number]  . Lumbar spine surgery    . Cervical spine surgery      02/2011, 03/2011  . Rotary cuff Right        HPI  from the history and physical done on the day of admission:    Kathleen Haynes is a 82 y.o. female with below past medical history who was brought via EMS to the emergency department after having a fall at home.  Apparently the patient was trying to get out of bed without help from one of her relatives. She felt sustaining injuries to her right eye, nose and mouth. She had her to bottom teeth missing and a laceration across her  philtrum.   When seen, the patient was mildly anxious while repeat hip x-ray's were getting done. She subsequently come down with the presence of family members once they images were taken. Workup in the emergency department shows right hip closed fracture.     Hospital Course:      1. Mechanical fall from the bed causing right scalp hematoma, lip laceration and hematoma which was sutured in the ER, right femoral neck fracture. Seen by ER physician from trauma standpoint and cleared, seen by orthopedic surgeon Dr. Ninfa Linden discussed the case with him. No surgical intervention. Weightbearing and activity as tolerated, supportive care with pain control, Today I will discharge her to home with home health PT. Family/husband agreeable with the plan. Noted baseline she is largely bed and wheelchair-bound.   2. Parkinson's disease with underlying dementia. Now close to baseline per her husband, Home medications to be continued. Tinea supportive care at home and follow with PCP.  3. Mildly prolonged QTC. Was given 1 g of magnesium. Stable and asymptomatic on telemetry.  4. Constipation. Placed on  bowel regimen.  5. Dyslipidemia. On statin continue.       Discharge Condition: Fair  Follow UP  Follow-up Information    Follow up with Dwan Bolt, MD. Schedule an appointment as soon as possible for a visit in 3 days.   Specialty:  Endocrinology   Contact information:   [address] [place] [address] [phone]       Follow up with Mcarthur Rossetti, MD. Schedule an appointment as soon as possible for a visit in 1 week.   Specialty:  Orthopedic Surgery   Contact information:   [place] [address] [phone]        Consults obtained - Ortho  Diet and Activity recommendation: See Discharge Instructions below  Discharge Instructions       Discharge Instructions    Diet - low sodium heart healthy    Complete by:  As  directed      Discharge instructions    Complete by:  As directed   Follow with Primary MD Dwan Bolt, MD in 3 days   Get CBC, CMP, 2 view Chest X ray checked  by Primary MD next visit.    Activity: As tolerated with Full fall precautions use walker/cane & assistance as needed   Disposition Home     Diet:   Heart Healthy with feeding assistance and aspiration precautions.  For Heart failure patients - Check your Weight same time everyday, if you gain over 2 pounds, or you develop in leg swelling, experience more shortness of breath or chest pain, call your Primary MD immediately. Follow Cardiac Low Salt Diet and 1.5 lit/day fluid restriction.   On your next visit with your primary care physician please Get Medicines reviewed and adjusted.   Please request your Prim.MD to go over all Hospital Tests and Procedure/Radiological results at the follow up, please get all Hospital records sent to your Prim MD by signing hospital release before you go home.   If you experience worsening of your admission symptoms, develop shortness of breath, life threatening emergency, suicidal or homicidal thoughts you must seek medical attention immediately by calling 911 or calling your MD immediately  if symptoms less severe.  You Must read complete instructions/literature along with all the possible adverse reactions/side effects for all the Medicines you take and that have been prescribed to you. Take any new Medicines after you have completely understood and accpet all the possible adverse reactions/side effects.   Do not drive, operating heavy machinery, perform activities at heights, swimming or participation in water activities or provide baby sitting services if your were admitted for syncope or siezures until you have seen by Primary MD or a Neurologist and advised to do so again.  Do not drive when taking Pain medications.    Do not take more than prescribed Pain, Sleep and Anxiety  Medications  Special Instructions: If you have smoked or chewed Tobacco  in the last 2 yrs please stop smoking, stop any regular Alcohol  and or any Recreational drug use.  Wear Seat belts while driving.   Please note  You were cared for by a hospitalist during your hospital stay. If you have any questions about your discharge medications or the care you received while you were in the hospital after you are discharged, you can call the unit and asked to speak with the hospitalist on call if the hospitalist that took care of you is not available. Once you are discharged, your primary  care physician will handle any further medical issues. Please note that NO REFILLS for any discharge medications will be authorized once you are discharged, as it is imperative that you return to your primary care physician (or establish a relationship with a primary care physician if you do not have one) for your aftercare needs so that they can reassess your need for medications and monitor your lab values.     Increase activity slowly    Complete by:  As directed              Discharge Medications       Medication List    TAKE these medications        atenolol-chlorthalidone 50-25 MG tablet  Commonly known as:  TENORETIC  Take 1 tablet by mouth daily.     carbidopa-levodopa 25-100 MG tablet  Commonly known as:  SINEMET IR  Take 1 tablet by mouth 2 (two) times daily.     donepezil 5 MG tablet  Commonly known as:  ARICEPT  Take 1 tablet (5 mg total) by mouth at bedtime.     memantine 14 MG Cp24 24 hr capsule  Commonly known as:  NAMENDA XR  Take 1 capsule (14 mg total) by mouth daily.     polyethylene glycol packet  Commonly known as:  MIRALAX / GLYCOLAX  Take 17 g by mouth 2 (two) times daily.     potassium chloride SA 20 MEQ tablet  Commonly known as:  K-DUR,KLOR-CON  Take 20 mEq by mouth daily.     pravastatin 80 MG tablet  Commonly known as:  PRAVACHOL  Take 80 mg by mouth daily.       traMADol 50 MG tablet  Commonly known as:  ULTRAM  Take 1 tablet (50 mg total) by mouth every 6 (six) hours as needed for moderate pain.     VESICARE 5 MG tablet  Generic drug:  solifenacin        Major procedures and Radiology Reports - PLEASE review detailed and final reports for all details, in brief -       Dg Pelvis 1-2 Views  11/30/2015  CLINICAL DATA:  [place] today, history of dementia EXAM: PELVIS - 1-2 VIEW COMPARISON:  CT abdomen pelvis 10/15/2014 FINDINGS: There appears to be an acute right femoral neck fracture with some displacement. Additional views may be helpful to evaluate further. The left hip is intact although mild degenerative change involves both hips. The pelvic rami appear intact. Hardware for fusion of the lower lumbar spine is noted. IMPRESSION: Acute right femoral neck fracture. Recommend additional views to evaluate further. Electronically Signed   By: Ivar Drape M.D.   On: 11/30/2015 17:19   Ct Head Wo Contrast  11/30/2015  CLINICAL DATA:  Fall today EXAM: CT HEAD WITHOUT CONTRAST TECHNIQUE: Contiguous axial images were obtained from the base of the skull through the vertex without intravenous contrast. COMPARISON:  10/15/2014 FINDINGS: Global atrophy. Encephalomalacia in the bilateral frontal lobes. Chronic ischemic changes in the periventricular white matter. No mass effect, midline shift, or acute hemorrhage. Soft tissue swelling over the right frontal bone. There is gas in the subcutaneous tissues of unknown significance. Old left zygomatic arch fracture. Mastoid air cells are clear. Visualized paranasal sinuses are clear. Chronic odontoid IMPRESSION: No acute intracranial pathology. Soft tissue swelling containing gas over the right frontal bone is noted. Electronically Signed   By: Marybelle Killings M.D.   On: 11/30/2015 17:43   Dg Chest Port 1  View  11/30/2015  CLINICAL DATA:  Preoperative examination for patient with a right hip fracture which occurred today.  EXAM: PORTABLE CHEST 1 VIEW COMPARISON:  PA and lateral chest 06/03/2015. FINDINGS: The lungs are clear. Heart size is normal. No pneumothorax or pleural effusion. No focal bony abnormality. IMPRESSION: No acute disease. Electronically Signed   By: Inge Rise M.D.   On: 11/30/2015 20:04   Dg Hip Unilat With Pelvis 2-3 Views Right  11/30/2015  CLINICAL DATA:  82 year old who fell today and injured the right hip. EXAM: DG HIP (WITH OR WITHOUT PELVIS) 2-3V RIGHT COMPARISON:  AP pelvis x-ray obtained earlier same day 1653 hr. FINDINGS: Acute intertrochanteric femoral neck fracture as suggested on the prior AP pelvis examination. Hip joint intact with mild-to-moderate inferomedial joint space narrowing. Osseous demineralization. No other visible fractures. IMPRESSION: Acute traumatic intertrochanteric right femoral neck fracture. Osseous demineralization. Mild to moderate osteoarthritis. Electronically Signed   By: Evangeline Dakin M.D.   On: 11/30/2015 20:06    Micro Results      No results found for this or any previous visit (from the past 240 hour(s)).     Today   Subjective    Kathleen Haynes today has no headache,no chest abdominal pain,no new weakness tingling or numbness, feels much better wants to go home today.     Objective   Blood pressure 137/66, pulse 90, temperature 97.4 F (36.3 C), temperature source Axillary, resp. rate 16, height 5\' 3"  (1.6 m), weight 40.3 kg (88 lb 13.5 oz), SpO2 97 %.   Intake/Output Summary (Last 24 hours) at 12/02/15 1112 Last data filed at 12/01/15 1513  Gross per 24 hour  Intake    120 ml  Output      0 ml  Net    120 ml    Exam Recently confused,, No new F.N deficits, Normal affect , right scalp bruise and lip laceration and bruise stable,PERRAL Supple Neck,No JVD, No cervical lymphadenopathy appriciated.  Symmetrical Chest wall movement, Good air movement bilaterally, CTAB RRR,No Gallops,Rubs or new Murmurs, No Parasternal  Heave +ve B.Sounds, Abd Soft, Non tender, No organomegaly appriciated, No rebound -guarding or rigidity. No Cyanosis, Clubbing or edema, No new Rash or bruise   Data Review   CBC w Diff: Lab Results  Component Value Date   WBC 5.9 12/02/2015   HGB 12.3 12/02/2015   HCT 36.5 12/02/2015   PLT 294 12/02/2015   LYMPHOPCT 25 12/02/2015   MONOPCT 12 12/02/2015   EOSPCT 14 12/02/2015   BASOPCT 1 12/02/2015    CMP: Lab Results  Component Value Date   NA 139 12/02/2015   K 3.6 12/02/2015   CL 98* 12/02/2015   CO2 24 12/02/2015   BUN 21* 12/02/2015   CREATININE 1.16* 12/02/2015   PROT 6.8 12/02/2015   ALBUMIN 3.1* 12/02/2015   BILITOT 0.9 12/02/2015   ALKPHOS 211* 12/02/2015   AST 29 12/02/2015   ALT 8* 12/02/2015  .   Total Time in preparing paper work, data evaluation and todays exam - 35 minutes  Thurnell Lose M.D on 12/02/2015 at 11:12 AM  [facility]   Office  [phone]

## 2015-12-02 NOTE — Progress Notes (Signed)
Pt has not passed urine in 8 hour period.  Bladder scan showed >105 cc. Will inform provider on call.

## 2015-12-02 NOTE — Discharge Instructions (Signed)
Follow with Primary MD Dwan Bolt, MD in 3 days   Get CBC, CMP, 2 view Chest X ray checked  by Primary MD next visit.    Activity: As tolerated with Full fall precautions use walker/cane & assistance as needed   Disposition Home     Diet:   Heart Healthy with feeding assistance and aspiration precautions.  For Heart failure patients - Check your Weight same time everyday, if you gain over 2 pounds, or you develop in leg swelling, experience more shortness of breath or chest pain, call your Primary MD immediately. Follow Cardiac Low Salt Diet and 1.5 lit/day fluid restriction.   On your next visit with your primary care physician please Get Medicines reviewed and adjusted.   Please request your Prim.MD to go over all Hospital Tests and Procedure/Radiological results at the follow up, please get all Hospital records sent to your Prim MD by signing hospital release before you go home.   If you experience worsening of your admission symptoms, develop shortness of breath, life threatening emergency, suicidal or homicidal thoughts you must seek medical attention immediately by calling 911 or calling your MD immediately  if symptoms less severe.  You Must read complete instructions/literature along with all the possible adverse reactions/side effects for all the Medicines you take and that have been prescribed to you. Take any new Medicines after you have completely understood and accpet all the possible adverse reactions/side effects.   Do not drive, operating heavy machinery, perform activities at heights, swimming or participation in water activities or provide baby sitting services if your were admitted for syncope or siezures until you have seen by Primary MD or a Neurologist and advised to do so again.  Do not drive when taking Pain medications.    Do not take more than prescribed Pain, Sleep and Anxiety Medications  Special Instructions: If you have smoked or chewed Tobacco   in the last 2 yrs please stop smoking, stop any regular Alcohol  and or any Recreational drug use.  Wear Seat belts while driving.   Please note  You were cared for by a hospitalist during your hospital stay. If you have any questions about your discharge medications or the care you received while you were in the hospital after you are discharged, you can call the unit and asked to speak with the hospitalist on call if the hospitalist that took care of you is not available. Once you are discharged, your primary care physician will handle any further medical issues. Please note that NO REFILLS for any discharge medications will be authorized once you are discharged, as it is imperative that you return to your primary care physician (or establish a relationship with a primary care physician if you do not have one) for your aftercare needs so that they can reassess your need for medications and monitor your lab values.

## 2015-12-04 DIAGNOSIS — S72001D Fracture of unspecified part of neck of right femur, subsequent encounter for closed fracture with routine healing: Secondary | ICD-10-CM | POA: Diagnosis not present

## 2015-12-04 DIAGNOSIS — G20 Parkinson's disease: Secondary | ICD-10-CM | POA: Diagnosis not present

## 2015-12-04 DIAGNOSIS — Z9181 History of falling: Secondary | ICD-10-CM | POA: Diagnosis not present

## 2015-12-04 DIAGNOSIS — S0003XD Contusion of scalp, subsequent encounter: Secondary | ICD-10-CM | POA: Diagnosis not present

## 2015-12-04 DIAGNOSIS — I10 Essential (primary) hypertension: Secondary | ICD-10-CM | POA: Diagnosis not present

## 2015-12-04 DIAGNOSIS — G4733 Obstructive sleep apnea (adult) (pediatric): Secondary | ICD-10-CM | POA: Diagnosis not present

## 2015-12-04 DIAGNOSIS — F0280 Dementia in other diseases classified elsewhere without behavioral disturbance: Secondary | ICD-10-CM | POA: Diagnosis not present

## 2015-12-04 DIAGNOSIS — H9190 Unspecified hearing loss, unspecified ear: Secondary | ICD-10-CM | POA: Diagnosis not present

## 2015-12-04 DIAGNOSIS — K5900 Constipation, unspecified: Secondary | ICD-10-CM | POA: Diagnosis not present

## 2015-12-04 DIAGNOSIS — R32 Unspecified urinary incontinence: Secondary | ICD-10-CM | POA: Diagnosis not present

## 2015-12-04 DIAGNOSIS — S01511D Laceration without foreign body of lip, subsequent encounter: Secondary | ICD-10-CM | POA: Diagnosis not present

## 2015-12-04 DIAGNOSIS — E785 Hyperlipidemia, unspecified: Secondary | ICD-10-CM | POA: Diagnosis not present

## 2015-12-06 ENCOUNTER — Other Ambulatory Visit: Payer: Self-pay

## 2015-12-06 DIAGNOSIS — G20 Parkinson's disease: Secondary | ICD-10-CM | POA: Diagnosis not present

## 2015-12-06 DIAGNOSIS — R32 Unspecified urinary incontinence: Secondary | ICD-10-CM | POA: Diagnosis not present

## 2015-12-06 DIAGNOSIS — E785 Hyperlipidemia, unspecified: Secondary | ICD-10-CM | POA: Diagnosis not present

## 2015-12-06 DIAGNOSIS — F0280 Dementia in other diseases classified elsewhere without behavioral disturbance: Secondary | ICD-10-CM | POA: Diagnosis not present

## 2015-12-06 DIAGNOSIS — I10 Essential (primary) hypertension: Secondary | ICD-10-CM | POA: Diagnosis not present

## 2015-12-06 DIAGNOSIS — H9190 Unspecified hearing loss, unspecified ear: Secondary | ICD-10-CM | POA: Diagnosis not present

## 2015-12-06 DIAGNOSIS — S0003XD Contusion of scalp, subsequent encounter: Secondary | ICD-10-CM | POA: Diagnosis not present

## 2015-12-06 DIAGNOSIS — Z9181 History of falling: Secondary | ICD-10-CM | POA: Diagnosis not present

## 2015-12-06 DIAGNOSIS — G4733 Obstructive sleep apnea (adult) (pediatric): Secondary | ICD-10-CM | POA: Diagnosis not present

## 2015-12-06 DIAGNOSIS — S01511D Laceration without foreign body of lip, subsequent encounter: Secondary | ICD-10-CM | POA: Diagnosis not present

## 2015-12-06 DIAGNOSIS — K5900 Constipation, unspecified: Secondary | ICD-10-CM | POA: Diagnosis not present

## 2015-12-06 DIAGNOSIS — S72001D Fracture of unspecified part of neck of right femur, subsequent encounter for closed fracture with routine healing: Secondary | ICD-10-CM | POA: Diagnosis not present

## 2015-12-06 NOTE — Patient Outreach (Signed)
[place] [facility]) Care Management  12/06/2015  Kathleen Haynes [birth year] [id number]  Assessment: 82 year old with recent admission due to fall, hip fracture. History of parkisone, dementia. No answer. HIPPA compliant message left.  Plan: follow up call tomorrow.  Thea Silversmith, RN, MSN, [place] Coordinator Cell: [phone]

## 2015-12-07 ENCOUNTER — Ambulatory Visit: Payer: Self-pay

## 2015-12-07 ENCOUNTER — Other Ambulatory Visit: Payer: Self-pay | Admitting: *Deleted

## 2015-12-07 NOTE — Patient Outreach (Addendum)
Second attempt made to contact member/family [phone]) to initiate transition of care program.  Unable to leave a voice message as phone continues to ring.  Will notify assigned care manager of attempt.  Valente David, BSN, [place] Management  [facility] Care Manager [phone]

## 2015-12-08 ENCOUNTER — Other Ambulatory Visit: Payer: Self-pay

## 2015-12-08 DIAGNOSIS — E785 Hyperlipidemia, unspecified: Secondary | ICD-10-CM | POA: Diagnosis not present

## 2015-12-08 DIAGNOSIS — R32 Unspecified urinary incontinence: Secondary | ICD-10-CM | POA: Diagnosis not present

## 2015-12-08 DIAGNOSIS — G20 Parkinson's disease: Secondary | ICD-10-CM | POA: Diagnosis not present

## 2015-12-08 DIAGNOSIS — H9190 Unspecified hearing loss, unspecified ear: Secondary | ICD-10-CM | POA: Diagnosis not present

## 2015-12-08 DIAGNOSIS — S01511D Laceration without foreign body of lip, subsequent encounter: Secondary | ICD-10-CM | POA: Diagnosis not present

## 2015-12-08 DIAGNOSIS — S72001D Fracture of unspecified part of neck of right femur, subsequent encounter for closed fracture with routine healing: Secondary | ICD-10-CM | POA: Diagnosis not present

## 2015-12-08 DIAGNOSIS — S0003XD Contusion of scalp, subsequent encounter: Secondary | ICD-10-CM | POA: Diagnosis not present

## 2015-12-08 DIAGNOSIS — I10 Essential (primary) hypertension: Secondary | ICD-10-CM | POA: Diagnosis not present

## 2015-12-08 DIAGNOSIS — G4733 Obstructive sleep apnea (adult) (pediatric): Secondary | ICD-10-CM | POA: Diagnosis not present

## 2015-12-08 DIAGNOSIS — F0280 Dementia in other diseases classified elsewhere without behavioral disturbance: Secondary | ICD-10-CM | POA: Diagnosis not present

## 2015-12-08 DIAGNOSIS — K5900 Constipation, unspecified: Secondary | ICD-10-CM | POA: Diagnosis not present

## 2015-12-08 DIAGNOSIS — Z9181 History of falling: Secondary | ICD-10-CM | POA: Diagnosis not present

## 2015-12-08 NOTE — Patient Outreach (Signed)
[place] [facility]) Care Management  12/08/2015  ROSYLN RAPOZA Apr 09, 1927 [id number]  Assessment: 82 year old with recent admission for fall/hip fracture. Member with history of dementia, parkinsons. RNCM attempted to speak with member, but she is extremely hard of hearing. RNCM unable to communicate with member telephonically. RNCM spoke with member's husband(caregiver). Member is active with Home health services. She has her follow up schedule d with primary care on tomorrow. Mr. Sluyter states he is going to call today to schedule her orthopedic follow up visit. Mr. Molock reports member is not having any issues with pain, stating the Tramadol is managing member's pain.   Member is in the transition of care program.  RNCM provided contact number.  RNCM provided number to 24 hour nurse advice line.  Plan: continue to follow, home visit in one to two weeks.  Thea Silversmith, RN, MSN, [place] Coordinator Cell: [phone]

## 2015-12-09 DIAGNOSIS — S72009A Fracture of unspecified part of neck of unspecified femur, initial encounter for closed fracture: Secondary | ICD-10-CM | POA: Diagnosis not present

## 2015-12-13 DIAGNOSIS — T148 Other injury of unspecified body region: Secondary | ICD-10-CM | POA: Diagnosis not present

## 2015-12-15 ENCOUNTER — Other Ambulatory Visit: Payer: Self-pay

## 2015-12-15 DIAGNOSIS — I10 Essential (primary) hypertension: Secondary | ICD-10-CM | POA: Diagnosis not present

## 2015-12-15 DIAGNOSIS — Z9181 History of falling: Secondary | ICD-10-CM | POA: Diagnosis not present

## 2015-12-15 DIAGNOSIS — S01511D Laceration without foreign body of lip, subsequent encounter: Secondary | ICD-10-CM | POA: Diagnosis not present

## 2015-12-15 DIAGNOSIS — E785 Hyperlipidemia, unspecified: Secondary | ICD-10-CM | POA: Diagnosis not present

## 2015-12-15 DIAGNOSIS — G4733 Obstructive sleep apnea (adult) (pediatric): Secondary | ICD-10-CM | POA: Diagnosis not present

## 2015-12-15 DIAGNOSIS — G20 Parkinson's disease: Secondary | ICD-10-CM | POA: Diagnosis not present

## 2015-12-15 DIAGNOSIS — S0003XD Contusion of scalp, subsequent encounter: Secondary | ICD-10-CM | POA: Diagnosis not present

## 2015-12-15 DIAGNOSIS — F0280 Dementia in other diseases classified elsewhere without behavioral disturbance: Secondary | ICD-10-CM | POA: Diagnosis not present

## 2015-12-15 DIAGNOSIS — R32 Unspecified urinary incontinence: Secondary | ICD-10-CM | POA: Diagnosis not present

## 2015-12-15 DIAGNOSIS — S72001D Fracture of unspecified part of neck of right femur, subsequent encounter for closed fracture with routine healing: Secondary | ICD-10-CM | POA: Diagnosis not present

## 2015-12-15 DIAGNOSIS — K5900 Constipation, unspecified: Secondary | ICD-10-CM | POA: Diagnosis not present

## 2015-12-15 DIAGNOSIS — H9190 Unspecified hearing loss, unspecified ear: Secondary | ICD-10-CM | POA: Diagnosis not present

## 2015-12-15 NOTE — Patient Outreach (Signed)
[facility] [facility]) Care Management  [facility] Care Manager  12/15/2015   BRANDICE BUSSER 06-Oct-1926 [id number]  Subjective: husband caregiver reports member is back to where she was. Denies pain,  Objective: BP 104/70 mmHg  Pulse 84  Resp 20  Wt 89 lb (40.37 kg)  SpO2 97%, lungs clear, heart rate regular,   Encounter Medications:  Outpatient Encounter Prescriptions as of 12/15/2015  Medication Sig Note  . atenolol-chlorthalidone (TENORETIC) 50-25 MG per tablet Take 1 tablet by mouth daily.   . carbidopa-levodopa (SINEMET IR) 25-100 MG tablet Take 1 tablet by mouth 2 (two) times daily.   Marland Kitchen donepezil (ARICEPT) 5 MG tablet Take 1 tablet (5 mg total) by mouth at bedtime.   . memantine (NAMENDA XR) 14 MG CP24 24 hr capsule Take 1 capsule (14 mg total) by mouth daily.   . polyethylene glycol (MIRALAX / GLYCOLAX) packet Take 17 g by mouth 2 (two) times daily.   . potassium chloride SA (K-DUR,KLOR-CON) 20 MEQ tablet Take 20 mEq by mouth daily.   . pravastatin (PRAVACHOL) 80 MG tablet Take 80 mg by mouth daily.   . traMADol (ULTRAM) 50 MG tablet Take 1 tablet (50 mg total) by mouth every 6 (six) hours as needed for moderate pain.   . VESICARE 5 MG tablet  05/19/2015: Received from: External Pharmacy   No facility-administered encounter medications on file as of 12/15/2015.    Functional Status:  In your present state of health, do you have any difficulty performing the following activities: 12/15/2015 04/80/2017  Hearing? Tempie Donning  Vision? Y Y  Difficulty concentrating or making decisions? Tempie Donning  Walking or climbing stairs? Y Y  Dressing or bathing? Y Y  Doing errands, shopping? Tempie Donning  Preparing Food and eating ? Y -  Using the Toilet? Y -  In the past six months, have you accidently leaked urine? Y -  Do you have problems with loss of bowel control? N -  Managing your Medications? Y -  Managing your Finances? Y -  Housekeeping or managing your Housekeeping? Y -    Fall/Depression  Screening: PHQ 2/9 Scores 12/15/2015  Exception Documentation Medical reason    Assessment: 82 year old hospitalized for fall/right hip fracture. History of advanced dementia, parkinsons. Member is also very hard of hearing. She has hearing aides, but does not wear them. Member was sitting up in chair. voicing no complaints. Member husband(primary caregiver) reports member is doing well. Mr. Shreeve reports they have a  supportive family. Denies resource needs. Remains active with home health for nursing, physical therapy and bath aid.   RNCM discussed community resources to support Mr. Crownover so he can have some time for himself. Mr. Heaton reports he has a supportive family, children and her sister who come to stay with her when he needs assistance.  RNCM discussed the importance of fall prevention.   Plan: continue weekly engagement for transition of care.  THN CM Care Plan Problem One        Most Recent Value   Care Plan Problem One  at risk for readmissioin   Role Documenting the Problem One  Care Management [place] for Problem One  Active   THN Long Term Goal (31-90 days)  Member will not be rehospitalized within the next 31 days.   THN Long Term Goal Start Date  80/12/17   Interventions for Problem One Long Term Goal  home visit completed, medication reviewed.   THN  CM Short Term Goal #1 (0-30 days)  Member/caregiver will attend all scheduled appoointments within the next 30 days.   THN CM Short Term Goal #1 Start Date  80/12/17   Interventions for Short Term Goal #1  discussed follow up appointments.   THN CM Short Term Goal #2 (0-30 days)  member/care giver will call to schedule orhtopedic appointment within the next 1-2 weeks.   THN CM Short Term Goal #2 Start Date  80/12/17   [facility] CM Short Term Goal #2 Met Date  12/15/15   Interventions for Short Term Goal #2  has been following up with orthopedic physician    [facility] CM Care Plan Problem Two        Most Recent Value    Care Plan Problem Two  at risk for fall   Role Documenting the Problem Two  Care Management [place] for Problem Two  Active   THN CM Short Term Goal #1 (0-30 days)  caregiver will voice fall prevention strategies within the next 2-3 weeks.   THN CM Short Term Goal #1 Start Date  12/15/15   Interventions for Short Term Goal #2   discussed fall prevention strategies.     Thea Silversmith, RN, MSN, [place] Coordinator Cell: [phone]

## 2015-12-16 DIAGNOSIS — S72114D Nondisplaced fracture of greater trochanter of right femur, subsequent encounter for closed fracture with routine healing: Secondary | ICD-10-CM | POA: Diagnosis not present

## 2015-12-17 DIAGNOSIS — G20 Parkinson's disease: Secondary | ICD-10-CM | POA: Diagnosis not present

## 2015-12-17 DIAGNOSIS — R32 Unspecified urinary incontinence: Secondary | ICD-10-CM | POA: Diagnosis not present

## 2015-12-17 DIAGNOSIS — S01511D Laceration without foreign body of lip, subsequent encounter: Secondary | ICD-10-CM | POA: Diagnosis not present

## 2015-12-17 DIAGNOSIS — E785 Hyperlipidemia, unspecified: Secondary | ICD-10-CM | POA: Diagnosis not present

## 2015-12-17 DIAGNOSIS — I10 Essential (primary) hypertension: Secondary | ICD-10-CM | POA: Diagnosis not present

## 2015-12-17 DIAGNOSIS — Z9181 History of falling: Secondary | ICD-10-CM | POA: Diagnosis not present

## 2015-12-17 DIAGNOSIS — S0003XD Contusion of scalp, subsequent encounter: Secondary | ICD-10-CM | POA: Diagnosis not present

## 2015-12-17 DIAGNOSIS — G4733 Obstructive sleep apnea (adult) (pediatric): Secondary | ICD-10-CM | POA: Diagnosis not present

## 2015-12-17 DIAGNOSIS — K5900 Constipation, unspecified: Secondary | ICD-10-CM | POA: Diagnosis not present

## 2015-12-17 DIAGNOSIS — H9190 Unspecified hearing loss, unspecified ear: Secondary | ICD-10-CM | POA: Diagnosis not present

## 2015-12-17 DIAGNOSIS — S72001D Fracture of unspecified part of neck of right femur, subsequent encounter for closed fracture with routine healing: Secondary | ICD-10-CM | POA: Diagnosis not present

## 2015-12-17 DIAGNOSIS — F0280 Dementia in other diseases classified elsewhere without behavioral disturbance: Secondary | ICD-10-CM | POA: Diagnosis not present

## 2015-12-22 ENCOUNTER — Other Ambulatory Visit: Payer: Self-pay

## 2015-12-22 NOTE — Patient Outreach (Signed)
[facility]) Care Management  12/22/2015  Kathleen Haynes 03/27/1927 [id number]  Transition of care call. Due to member's dementia and hard of hearing, RNCM spoke with member's husband/caregiver. Mr. Abdullah reports member has seen both primary care and orthopedic physician. Member is improving. Member continues to remain active with home health services. RNCM encouraged Mr. Sinquefield to work with member to perform the exercises assigned by physical therapist. Reviewed fall prevention strategies.  Plan: continue weekly transition of care engagements.  Thea Silversmith, RN, MSN, [place] Coordinator Cell: [phone]

## 2015-12-29 ENCOUNTER — Other Ambulatory Visit: Payer: Self-pay

## 2015-12-29 DIAGNOSIS — G20 Parkinson's disease: Secondary | ICD-10-CM | POA: Diagnosis not present

## 2015-12-29 DIAGNOSIS — S72001D Fracture of unspecified part of neck of right femur, subsequent encounter for closed fracture with routine healing: Secondary | ICD-10-CM | POA: Diagnosis not present

## 2015-12-29 DIAGNOSIS — Z9181 History of falling: Secondary | ICD-10-CM | POA: Diagnosis not present

## 2015-12-29 DIAGNOSIS — K5900 Constipation, unspecified: Secondary | ICD-10-CM | POA: Diagnosis not present

## 2015-12-29 DIAGNOSIS — G4733 Obstructive sleep apnea (adult) (pediatric): Secondary | ICD-10-CM | POA: Diagnosis not present

## 2015-12-29 DIAGNOSIS — S01511D Laceration without foreign body of lip, subsequent encounter: Secondary | ICD-10-CM | POA: Diagnosis not present

## 2015-12-29 DIAGNOSIS — F0280 Dementia in other diseases classified elsewhere without behavioral disturbance: Secondary | ICD-10-CM | POA: Diagnosis not present

## 2015-12-29 DIAGNOSIS — H9190 Unspecified hearing loss, unspecified ear: Secondary | ICD-10-CM | POA: Diagnosis not present

## 2015-12-29 DIAGNOSIS — S0003XD Contusion of scalp, subsequent encounter: Secondary | ICD-10-CM | POA: Diagnosis not present

## 2015-12-29 DIAGNOSIS — R32 Unspecified urinary incontinence: Secondary | ICD-10-CM | POA: Diagnosis not present

## 2015-12-29 DIAGNOSIS — I10 Essential (primary) hypertension: Secondary | ICD-10-CM | POA: Diagnosis not present

## 2015-12-29 DIAGNOSIS — E785 Hyperlipidemia, unspecified: Secondary | ICD-10-CM | POA: Diagnosis not present

## 2015-12-29 NOTE — Patient Outreach (Signed)
[facility]) Care Management  12/29/2015  Kathleen Haynes Mar 10, 1927 [id number]  82 year old admitted 4/4-4/6 following fall, hip fracture, history of parkinson's, advanced dementia. RNCM called for transition of care. No answer. HIPPA compliant message left.  Plan: continue weekly transition of care calls.   Thea Silversmith, RN, MSN, [place] Coordinator Cell: [phone]

## 2016-01-05 DIAGNOSIS — H9190 Unspecified hearing loss, unspecified ear: Secondary | ICD-10-CM | POA: Diagnosis not present

## 2016-01-05 DIAGNOSIS — S01511D Laceration without foreign body of lip, subsequent encounter: Secondary | ICD-10-CM | POA: Diagnosis not present

## 2016-01-05 DIAGNOSIS — G4733 Obstructive sleep apnea (adult) (pediatric): Secondary | ICD-10-CM | POA: Diagnosis not present

## 2016-01-05 DIAGNOSIS — S72001D Fracture of unspecified part of neck of right femur, subsequent encounter for closed fracture with routine healing: Secondary | ICD-10-CM | POA: Diagnosis not present

## 2016-01-05 DIAGNOSIS — K5900 Constipation, unspecified: Secondary | ICD-10-CM | POA: Diagnosis not present

## 2016-01-05 DIAGNOSIS — F0280 Dementia in other diseases classified elsewhere without behavioral disturbance: Secondary | ICD-10-CM | POA: Diagnosis not present

## 2016-01-05 DIAGNOSIS — R32 Unspecified urinary incontinence: Secondary | ICD-10-CM | POA: Diagnosis not present

## 2016-01-05 DIAGNOSIS — S0003XD Contusion of scalp, subsequent encounter: Secondary | ICD-10-CM | POA: Diagnosis not present

## 2016-01-05 DIAGNOSIS — E785 Hyperlipidemia, unspecified: Secondary | ICD-10-CM | POA: Diagnosis not present

## 2016-01-05 DIAGNOSIS — I10 Essential (primary) hypertension: Secondary | ICD-10-CM | POA: Diagnosis not present

## 2016-01-05 DIAGNOSIS — G20 Parkinson's disease: Secondary | ICD-10-CM | POA: Diagnosis not present

## 2016-01-05 DIAGNOSIS — Z9181 History of falling: Secondary | ICD-10-CM | POA: Diagnosis not present

## 2016-01-06 ENCOUNTER — Other Ambulatory Visit: Payer: Self-pay

## 2016-01-06 NOTE — Patient Outreach (Signed)
[facility]) Care Management  01/06/2016  Kathleen Haynes 23-Mar-1927 [id number]  RNCM called for transition of care. Member recently admitted with fall/hip fracture. RNCM spoke with member's husband as member has advanced dementia. Mr. Kincaid reports that member is having a flare of pain in her neck adding she slept on her neck wrong a few nights ago and member has been complaining of her neck. Mr. Delgardo states member has history of having neck pain due to fracuture of her neck in the past. Mr. Hase reports he gives member Tramadol for pain and he thinks it is helping, but states it is hard to tell with member. RNCM also reinforced sometimes ice packs will help and he could also try warm pack to see if that will help. RNCM encouraged Mr. Secora to contact primary care for further direction if member does not seem to be better by tomorrow. Mr. Pirie reported he would call if member does not improve.  Plan: follow up next week.  Thea Silversmith, RN, MSN, [place] Coordinator Cell: [phone]

## 2016-01-14 ENCOUNTER — Other Ambulatory Visit: Payer: Self-pay

## 2016-01-14 NOTE — Patient Outreach (Signed)
[place] [facility]) Care Management  01/14/2016  ROSANGELA GARRETTE 07-26-1977 [id number]  Assessment-82 year old with admission 4/4-4/6 due to fall/hip fracture. RNCM called for transition of care call (week 4). No answer. HIPPA compliant message left.  Plan: follow up call within 1-2 weeks if no return call.  Thea Silversmith, RN, MSN, [place] Coordinator Cell: [phone]

## 2016-01-21 ENCOUNTER — Other Ambulatory Visit: Payer: Self-pay

## 2016-01-21 NOTE — Patient Outreach (Signed)
[place] [facility]) Care Management  01/21/2016  Kathleen Haynes April 30, 1927 [id number]  RNCM called to follow up to see if member has any additional care management needs. No answer. HIPPA compliant message left.  Plan: follow up in 1-2 weeks.  Thea Silversmith, RN, MSN, [place] Coordinator Cell: [phone]

## 2016-02-03 ENCOUNTER — Other Ambulatory Visit: Payer: Self-pay

## 2016-02-03 NOTE — Patient Outreach (Signed)
[name] [name]) Care Management  02/03/2016  Kathleen Haynes 1926/08/30 [id number]  Call to complete transition of care call/assess additional care management needs. No answer. HIPPA compliant message left. 3rd unsuccessful outreach.  Plan: Geophysicist/field seismologist.  Thea Silversmith, RN, MSN, [place] Coordinator Cell: [phone]

## 2016-02-17 NOTE — Patient Outreach (Signed)
[facility]) Care Management  02/17/2016  TYEISHA PAUTSCH 1926-09-02 [id number]   82 year old with history of hip fracture,Parkinson, dementia, falls. RNCM has been following for transition of care. Calls x3 to reach primary caregiver regarding if any additonal care management needs. No answer. Outreach letter sent with no response.   Plan: close case.  Thea Silversmith, RN, MSN, [place] Coordinator Cell: [phone]

## 2016-03-29 ENCOUNTER — Encounter: Payer: Self-pay | Admitting: Neurology

## 2016-03-29 ENCOUNTER — Ambulatory Visit (INDEPENDENT_AMBULATORY_CARE_PROVIDER_SITE_OTHER): Payer: Medicare Other | Admitting: Neurology

## 2016-03-29 VITALS — BP 128/70 | HR 78 | Resp 14

## 2016-03-29 DIAGNOSIS — F0280 Dementia in other diseases classified elsewhere without behavioral disturbance: Secondary | ICD-10-CM | POA: Diagnosis not present

## 2016-03-29 DIAGNOSIS — T1490 Injury, unspecified: Secondary | ICD-10-CM

## 2016-03-29 DIAGNOSIS — G20 Parkinson's disease: Secondary | ICD-10-CM

## 2016-03-29 DIAGNOSIS — W19XXXA Unspecified fall, initial encounter: Secondary | ICD-10-CM | POA: Diagnosis not present

## 2016-03-29 DIAGNOSIS — Z9181 History of falling: Secondary | ICD-10-CM

## 2016-03-29 DIAGNOSIS — R443 Hallucinations, unspecified: Secondary | ICD-10-CM

## 2016-03-29 NOTE — Progress Notes (Signed)
Subjective:    Patient ID: Kathleen Haynes is a 82 y.o. female.  HPI     Interim history:   [name] is a very pleasant 82 year old right-handed woman with an underlying medical history of hyperlipidemia, hypertension, OSA, chronic back pain, and C2 fracture from a fall, who presents for followup consultation of her left-sided predominant Parkinson's disease, complicated by severe hearing loss, urinary incontinence, chronic constipation, recurrent falls, memory loss, RBD, hallucinations, frailty, advancing age, neck pain, s/p neck surgery under Dr. Vertell Limber, motor fluctuations, motor complications, and complex sleep apnea (Hx of CPAP intolerance). She is accompanied by her husband again today. I last saw her on 09/30/2015, at which time her husband reported a recent fall. We talked about fall risk and gait safety at length at the time. I kept her medications the same, she was advised never to get out of bed or get up by herself.   Today, 03/29/2016: She reports. Her husband reports most of her history. Unfortunately, she had another fall. She presented to the emergency room after a fall at the house. She got out of bed and sustained injuries to her face. She unfortunately also sustained a right hip fracture. This was managed conservatively. She was in the hospital from 11/30/2015 through 12/02/2015 and I reviewed the hospital records including discharge summary, hip x-ray and pelvis x-ray from 11/30/2015 which showed: IMPRESSION: Acute traumatic intertrochanteric right femoral neck fracture. Osseous demineralization. Mild to moderate osteoarthritis.   She was seen by orthopedics. Surgery was not recommended. She had a head CT without contrast and I reviewed the results: IMPRESSION: No acute intracranial pathology. Soft tissue swelling containing gas over the right frontal bone is noted.  Husband reports that she complains of neck pain, sometimes headache, constipation is under reasonable control  with MiraLAX. She does not sleep well at night and sleep is disrupted by multiple awakenings and wanting to get out of bed, often without an apparent reason. This disturbs his sleep as well. He has installed a bed alarm, uses a baby monitor as well and he can hear her stirring throughout the night.    Previously:   I saw her on 05/19/2015 at which time she was essentially nonverbal. She had residual severe neck pain. She was on tramadol for this. She had seen Dr. Vertell Limber in follow-up for her neck pain but there was really nothing else he could do for her. She was on Robaxin up to twice daily and was sleepy during the day. I suggested we continue with her long-acting Namenda 14 mg daily, Aricept 5 mg daily, generic, and Sinemet 1 tablet twice daily. They were advised to try a microwavable heat pad for her neck pain.   I saw her on 01/15/2015, at which time her husband provided her entire history. She had improvement in her pain. She had finished home health therapy. She was sleep talking. She was not always compliant with her walker and had a tendency to get up and hold onto things for support. She was not always asking for help or waiting for help. I suggested that we continue her memory medications and Sinemet at the current doses. She was reminded to drink more water and never to get up by herself and to use a walker only with additional assistance.   I saw her on 09/15/2014, at which time she reported improved constipation. She was advised to drink more water and less Pepsi. She was having vivid dreams. She was talking in her sleep. She  had no significant issues with REM behavior disorder. She was using her walker or her wheelchair is a walker and sometimes was holding onto things at home. Her memory was stable. She was on Aricept 5 mg and Namenda long-acting 14 mg daily. She was on Sinemet twice daily. She was on Linzess once daily in the morning on an empty stomach. She was having ongoing visual  hallucinations but they were stable. Their daughter lives next door and their son lives in the area as well. I suggested we continue with the same medication regimen.   Unfortunately, in the interim, she fell at home in the bathroom and injured herself. She was hospitalized in February. I reviewed the hospital records including discharge summary on test results. She was admitted on 10/15/2014 and discharged on 10/22/2014. She sustained a traumatic pneumothorax and rib fractures. She required left tube thoracostomy on 10/17/2014. Workup also included CT head and cervical spine without contrast on 10/15/2014: 1. No signs of significant acute traumatic injury to the skull, brain or cervical spine. 2. Chronic type 2 odontoid fracture redemonstrated with progressive degenerative changes at C1 and C2, as described above. In addition, there is multilevel degenerative disc disease and cervical spondylosis throughout other portions of the cervical spine as well. 3. 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 I personally reviewed the images through the PACS system and agree with the findings.   CT abdomen and pelvis with and without contrast on 10/15/2014 showed: Multiple left-sided segmental rib fractures with moderate-sized left pneumothorax. No acute intra-abdominal or pelvic pathology.   She was discharged to home with home health services.    I saw her on 03/12/2014, at which time her husband reported that she does not like to wear her soft neck collar. She was using the wheelchair for a walker. He reported no recent falls. She was no longer on Aricept. She was having REM behavior disorder and nearly daily hallucinations. Her memory was deemed stable per him. She was on Namenda long-acting 14 mg once daily, carbidopa levodopa 1 pill twice daily, and I  restarted her on Aricept 5 mg once daily. In the interim, she was seen by our nurse practitioner, Ms. Lam, on 06/16/2014, at which time she was continued on the current medication regimen and she was on Linzess for chronic constipation.   I saw her on 10/20/13, at which time I talked to them at length about her complex situation and limitation in what we can do with medical management. She had been on low-dose levodopa and needed adjustment in the past for hallucinations. We talked about her fall risk and she was essentially wheelchair dependent at the time. I did not make any changes to her medications with the exception of changing her to long-acting Namenda once daily 14 mg.   She no longer sees Dr. Vertell Limber in [place]. Memory loss is stable. She has been tolerating the Namenda XR 14 mg. She is on C/L 1 pill twice daily. He could split the pills without crushing. He gives her Ensure as well. Their daughter helps out and their son lives a mile away and the 3 GC come and visit.    I first met her on 04/18/2013, which time I talked to her husband a long time about the complexity of her issues. I was reluctant to increase her levodopa for fear of side effects including  exacerbation of her hallucinations.   She previously followed with Dr. Morene Antu and was last seen by Dr. Erling Cruz on 09/20/2012, at which time he started donepezil for dementia and did some blood work. He decreased her Sinemet to one tablet, alternating with half a tablet 4 times a day for a total of 300 mg of levodopa to help with dyskinesias and hallucinations. He did not start her on amantadine because of her dementia. She was also advised to start CPAP for obstructive sleep apnea. She has been on donepezil 5 mg daily, tramadol, meloxicam, multivitamin, stool softener, Detrol LA, pilocarpine, pravastatin, atenolol-chlorthalidone, Klor-Con, Sinemet 1 tablet 8, half a tablet at noon, 1 tablet at 4 PM and half a tablet at bedtime. She had B12, TSH, RPR  tested in January 2014, which were normal.   She was diagnosed with left-sided Parkinson's disease in 1999 and started on Sinemet at the time. She started having dyskinesias and started using a rolling walker with a seat and a wheelchair. She has had recurrent falls and freezing spells and festination and did not improve with selegiline which was stopped. She developed constipation and urinary incontinence and does not exercise regularly. She had hallucinations and delusions. She fell and hit her head in May 2012 and was seen in the emergency room. She was found to have a C2 vertebral fracture and CT head without contrast showed generalized atrophy. She had surgery by Dr. Vertell Limber in July 2012 and August 2012 she started having memory loss in 2012. In December 2012 her MMSE was 24, clock drawing was 4, and normal fluency was 9. She does not sleep well at night and takes multiple naps during the day. She was diagnosed with complex sleep apnea in March 2012 without REM onset behavior disorder. She had a repeat sleep study in April 2012 for CPAP titration but then never started CPAP. She had frequent PLMS. She takes meloxicam and tramadol for her residual head and neck pain.   She saw Dr. Brett Fairy in April 2014, and was encouraged to use her CPAP. Dr. Brett Fairy reviewed her sleep test results and compliance data with the patient and her husband at the time.   Her high cervical fracture was treated with a brace. She has a good support system with daughter living next door. She has hallucinations especially at night. She has not been using her CPAP and it was taken back. She continually pulled off the mask in the middle of the night and the husband would wake up with a leaking air noise. He has tried but has not been able to make her use the CPAP. She used it 19/30 days and mostly for 1 to 2 hours at a time and she still had a high residual AHI. She has been on Namenda and Aricept. She has dyskinesias on the LLE. Of note,  she has severe hearing loss and threw away 2 pairs of hearing aids.    Her Past Medical History Is Significant For: Past Medical History:  Diagnosis Date  . Back pain    lower back pain , C2 fracture from fall  . Dementia   . High cholesterol    since 2000  . Hypertension    since 1985  . Obstructive apnea   . Parkinson disease [facility])     Her Past Surgical History Is Significant For: Past Surgical History:  Procedure Laterality Date  . CERVICAL SPINE SURGERY     02/2011, 03/2011  . LUMBAR SPINE SURGERY    .  rotary cuff Right   . SHOULDER SURGERY Left    [phone]    Her Family History Is Significant For: Family History  Problem Relation Age of Onset  . Cancer Mother   . Congestive Heart Failure Father   . Diabetes Other   . Hypertension Other   . Hyperlipidemia Other   . Heart disease Other     Her Social History Is Significant For: Social History   Social History  . Marital status: Married    Spouse name: [name]  . Number of children: 2  . Years of education: HS   Occupational History  .  Retired   Social History Main Topics  . Smoking status: Never Smoker  . Smokeless tobacco: Never Used  . Alcohol use No  . Drug use: No  . Sexual activity: Not Asked   Other Topics Concern  . None   Social History Narrative   Patient lives at home with spouse.   Caffeine Use: 2 cups daily    Her Allergies Are:  No Known Allergies:   Her Current Medications Are:  Outpatient Encounter Prescriptions as of 03/29/2016  Medication Sig  . atenolol-chlorthalidone (TENORETIC) 50-25 MG per tablet Take 1 tablet by mouth daily.  . carbidopa-levodopa (SINEMET IR) 25-100 MG tablet Take 1 tablet by mouth 2 (two) times daily.  Marland Kitchen donepezil (ARICEPT) 5 MG tablet Take 1 tablet (5 mg total) by mouth at bedtime.  . memantine (NAMENDA XR) 14 MG CP24 24 hr capsule Take 1 capsule (14 mg total) by mouth daily.  . polyethylene glycol (MIRALAX / GLYCOLAX) packet Take 17 g by mouth 2  (two) times daily.  . potassium chloride SA (K-DUR,KLOR-CON) 20 MEQ tablet Take 20 mEq by mouth daily.  . pravastatin (PRAVACHOL) 80 MG tablet Take 80 mg by mouth daily.  . traMADol (ULTRAM) 50 MG tablet Take 1 tablet (50 mg total) by mouth every 6 (six) hours as needed for moderate pain.  . VESICARE 5 MG tablet    No facility-administered encounter medications on file as of 03/29/2016.   :  Review of Systems:  Out of a complete 14 point review of systems, all are reviewed and negative with the exception of these symptoms as listed below: Review of Systems  Neurological:       No new concerns per husband.  States that patient has a broken neck, physicians have been unable to repair. Patient is wearing a neck brace.     Objective:  Neurologic Exam  Physical Exam Physical Examination:   Vitals:   03/29/16 1258  BP: 128/70  Pulse: 78  Resp: 14    General Examination: The patient is a very pleasant 82 y.o. female in no acute distress. She is situated in her WC. She is frail and deconditioned appearing.    HEENT: Normocephalic, atraumatic, soft brace around the neck, pupils are equal, round and reactive to light and accommodation. Extraocular tracking shows moderate to severe saccadic breakdown without nystagmus noted. S/p cataract repairs. There is limitation to entire gaze. There is moderate decrease in eye blink rate. Hearing is impaired, has b/l hearing aids in place. Face is symmetric with mild facial masking and normal facial sensation. There is a mild lower jaw tremor. Neck Is moderately rigid, but limited mobility, c/o neck pain. Oropharynx exam reveals mild mouth dryness, tongue protrudes midline. There is minimal pooling of saliva in the floor of her mouth.    Chest: is clear to auscultation without wheezing, rhonchi or crackles noted.  Heart: sounds are regular and normal without murmurs, rubs or gallops noted.   Abdomen: is soft, non-tender and non-distended with normal  bowel sounds appreciated on auscultation.  Extremities: There is no pitting edema in the distal lower extremities bilaterally. Pedal pulses are intact. There are no varicose veins.  Skin: is warm and dry with no trophic changes noted. Age-related changes are noted on the skin.   Musculoskeletal: exam reveals no obvious joint deformities, tenderness, joint swelling or erythema.  Neurologically:   Mental status: The patient is paying fair attention and is able to follow simple commands, and answers simple questions. She is not able to follow any complex commands but is able to do one-step commands. She has impaired memory and comprehension. Affect is better.    Cranial nerves are as described above under HEENT exam. In addition, shoulder shrug is normal with equal shoulder height noted.  Motor exam: thin bulk, and strength globally 4/5. There are mild intermittent dyskinesias noted. These are primarily noted in the Left Lower Limb and there is L foot inversion and dystonic posturing, all unchanged.  Tone is mildly rigid with absence of cogwheeling. There is overall moderate bradykinesia. There is no drift or rebound.  There is no tremor. Romberg is not testable.   Reflexes are trace in the upper extremities and trace in the lower extremities.   Fine motor skills exam: are moderate to severely impaired globally. She has minimal intermittent dyskinesias in the left lower extremity.  Cerebellar testing shows no dysmetria or intention tremor on finger to nose testing.  Sensory exam is intact to light touch.   Gait, station and balance: she cannot stand or walk. Leans to L in the WC.   Assessment and Plan:   In summary, JASHIYA BASSETT is a very pleasant 82 year old female with an underlying medical history of hyperlipidemia, hypertension, OSA, chronic back pain, and C2 fracture from a fall, with residual neck pain, who presents for followup consultation of her advanced, left-sided predominant  Parkinson's disease, complicated by severe hearing loss, memory loss, RBD, chronic constipation, advancing age with frailty and overall deconditioning, motor fluctuations and motor complications, and complex sleep apnea with intolerance to CPAP in the past and recurrent falls with injuries and residual neck pain. Hearing improved with the hearing aids. She has a tendency to get up at night and walk without the walker. This is how she tends to fall and she has repeatedly fallen, with injuries, most recently sustained a closed R hip Fx. She continues to be at great fall risk.  She is again advised strongly not to get out and walk without her walker or with her husband's assistance or the wheelchair. Unfortunately, medication options are limited because of advanced age, and medication intolerance in the past. I again discussed this with her husband in particular. She had suffered hemopneumothorax and several rib fractures in the past. She is on low-dose Sinemet. She is on low-dose dual memory medication. She is not safe to get up by herself. I suggested we continue with her current medications. He is giving her ensure once daily. He is encouraged to try to push that to twice daily. He has a baby monitor and also a bed alarm, refills on her bed and usually can hear her when she tries to get out of bed as the alarm goes off. Unfortunately, she has not been sleeping well. She is restless at night and every 1-1/2-2 hours she wants to get out of bed but  not necessarily for pain or to go to the bathroom, often without an apparent reason. This has of course impacted his sleep. I suggested we try her on low-dose melatonin. I provided written instructions and suggestions regarding the melatonin. She did not need refills on her medications today. I suggested a 6 month follow-up, sooner as needed. I answered all his questions today and the patient and particularly her husband were in agreement. I spent 25 minutes in total  face-to-face time with the patient, more than 50% of which was spent in counseling and coordination of care, reviewing test results, reviewing medication and discussing or reviewing the diagnosis of PD and dementia, the prognosis and treatment options.

## 2016-03-29 NOTE — Patient Instructions (Signed)
We will keep your parkinson's medication the same and your memory medication also, which is the donepezil 5 mg and Namenda XR 14 mg daily.    You can try Melatonin at night for sleep: take 1 mg to 3 mg, one to 2 hours before your bedtime. You can go up to 5 mg if needed. It is over the counter and comes in pill form, chewable form and spray, if you prefer.    You are at great fall risk and have unfortunately fallen.    You should try to increase the Ensure to 2 bottles a day if you can.

## 2016-05-30 DIAGNOSIS — E789 Disorder of lipoprotein metabolism, unspecified: Secondary | ICD-10-CM | POA: Diagnosis not present

## 2016-05-30 DIAGNOSIS — N3281 Overactive bladder: Secondary | ICD-10-CM | POA: Diagnosis not present

## 2016-05-30 DIAGNOSIS — Z0000 Encounter for general adult medical examination without abnormal findings: Secondary | ICD-10-CM | POA: Diagnosis not present

## 2016-05-30 DIAGNOSIS — I10 Essential (primary) hypertension: Secondary | ICD-10-CM | POA: Diagnosis not present

## 2016-05-30 DIAGNOSIS — Z23 Encounter for immunization: Secondary | ICD-10-CM | POA: Diagnosis not present

## 2016-06-06 DIAGNOSIS — E789 Disorder of lipoprotein metabolism, unspecified: Secondary | ICD-10-CM | POA: Diagnosis not present

## 2016-06-06 DIAGNOSIS — M542 Cervicalgia: Secondary | ICD-10-CM | POA: Diagnosis not present

## 2016-06-06 DIAGNOSIS — G20 Parkinson's disease: Secondary | ICD-10-CM | POA: Diagnosis not present

## 2016-06-06 DIAGNOSIS — R51 Headache: Secondary | ICD-10-CM | POA: Diagnosis not present

## 2016-07-07 ENCOUNTER — Other Ambulatory Visit: Payer: Self-pay | Admitting: Neurology

## 2016-07-07 DIAGNOSIS — F0280 Dementia in other diseases classified elsewhere without behavioral disturbance: Secondary | ICD-10-CM

## 2016-07-07 DIAGNOSIS — G20A1 Parkinson's disease without dyskinesia, without mention of fluctuations: Secondary | ICD-10-CM

## 2016-07-07 DIAGNOSIS — G4752 REM sleep behavior disorder: Secondary | ICD-10-CM

## 2016-07-07 DIAGNOSIS — R413 Other amnesia: Secondary | ICD-10-CM

## 2016-07-07 DIAGNOSIS — R54 Age-related physical debility: Secondary | ICD-10-CM

## 2016-07-07 DIAGNOSIS — R443 Hallucinations, unspecified: Secondary | ICD-10-CM

## 2016-07-07 DIAGNOSIS — K5909 Other constipation: Secondary | ICD-10-CM

## 2016-07-07 DIAGNOSIS — G20 Parkinson's disease: Secondary | ICD-10-CM

## 2016-08-18 IMAGING — CT CT ABD-PELV W/ CM
2 of 5 series · 16 of 46 positions shown, 18 images · IV contrast (omnipaque)
Comparison: Chest radiograph same date

CLINICAL DATA: Fall, left chest pain

EXAM:
CT ABDOMEN AND PELVIS WITH CONTRAST
TECHNIQUE: Multidetector CT imaging of the abdomen and pelvis was performed
using the standard protocol following bolus administration of
intravenous contrast.
CONTRAST:  80mL OMNIPAQUE IOHEXOL 300 MG/ML  SOLN

[Series 2: abd/ pelvis 5.0 i30f 1 · axial · 0.74mm/px · z∈[-790,-434]mm · 13 of 79 slices shown, 15 images]
[im 4/79  soft-tissue]
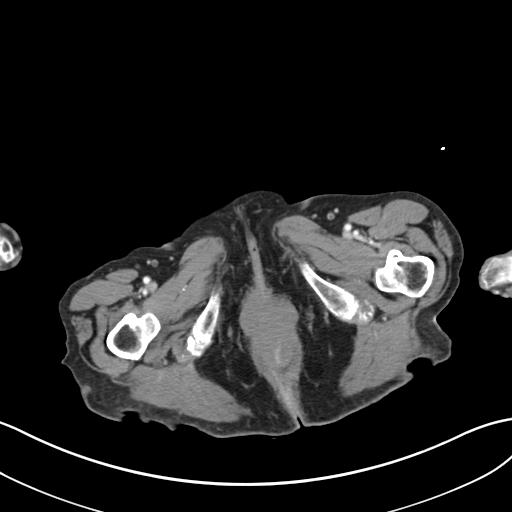
[im 4/79  bone]
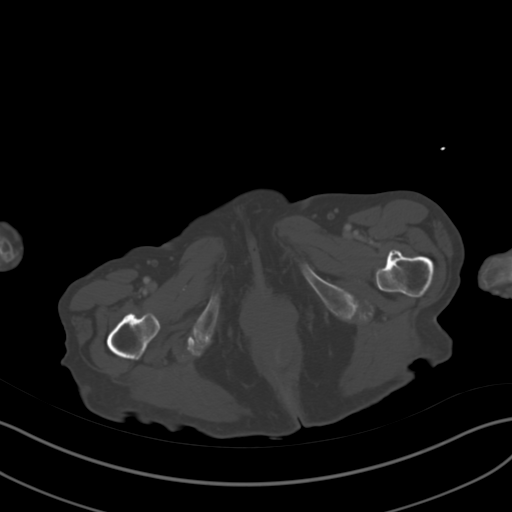
[im 12/79  soft-tissue]
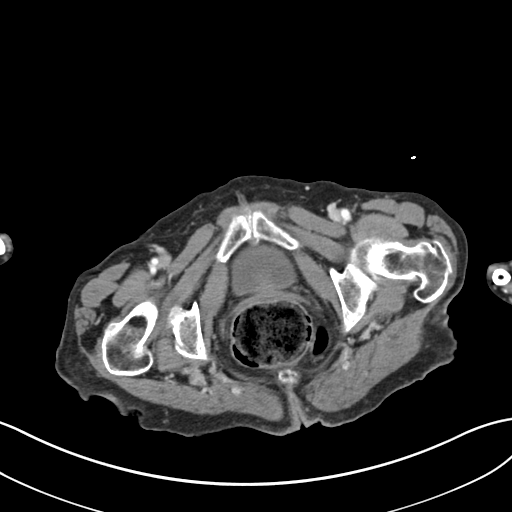
[im 15/79  soft-tissue]
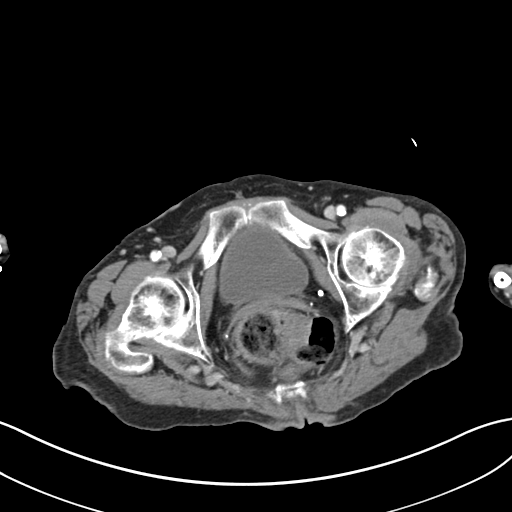
[im 23/79  soft-tissue]
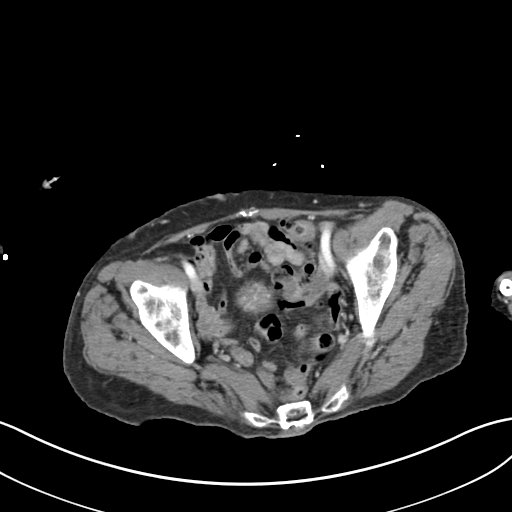
[im 27/79  soft-tissue]
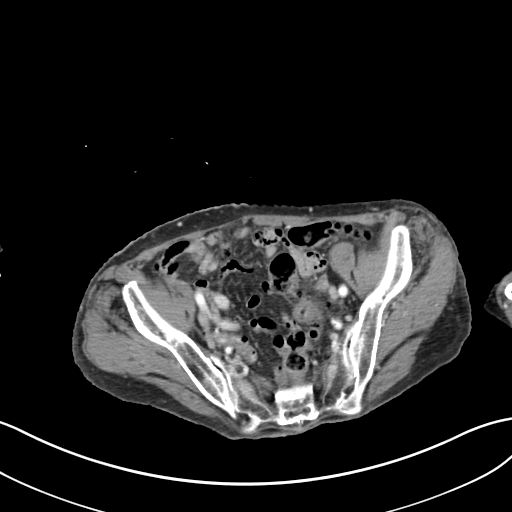
[im 34/79  soft-tissue]
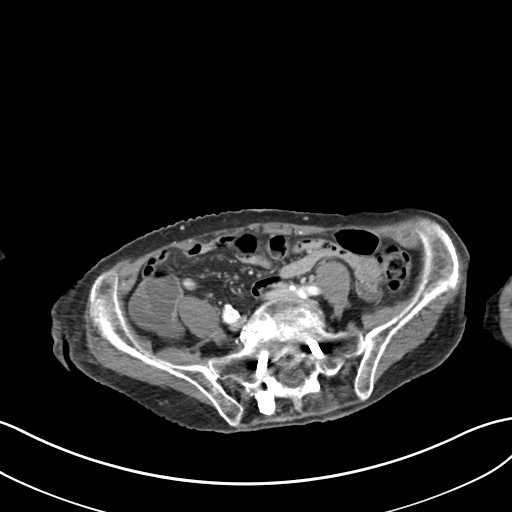
[im 41/79  soft-tissue]
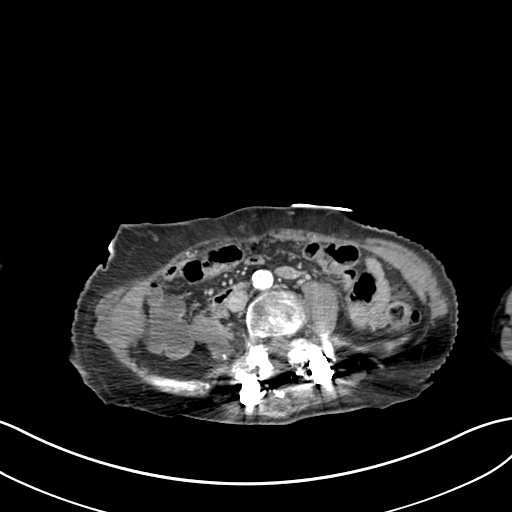
[im 45/79  soft-tissue]
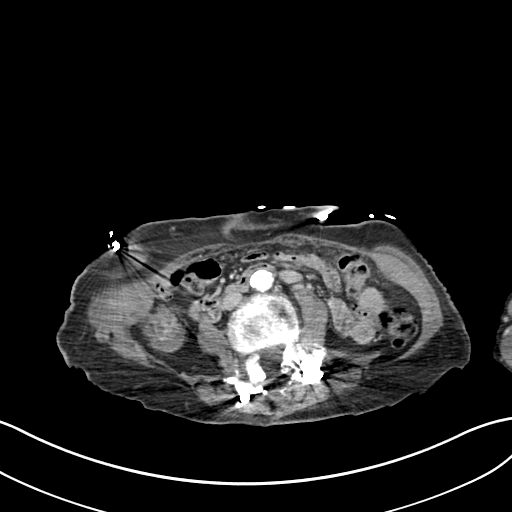
[im 53/79  soft-tissue]
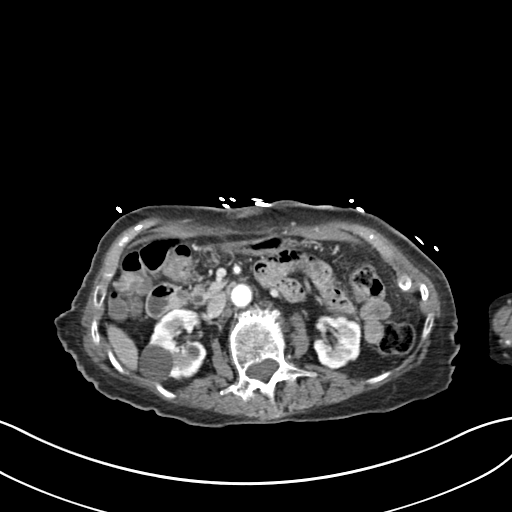
[im 53/79  bone]
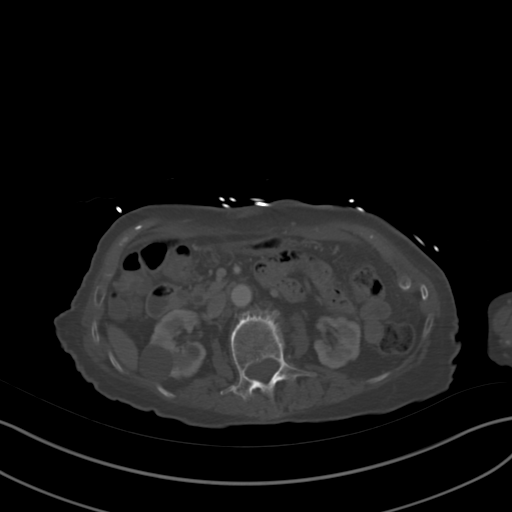
[im 56/79  soft-tissue]
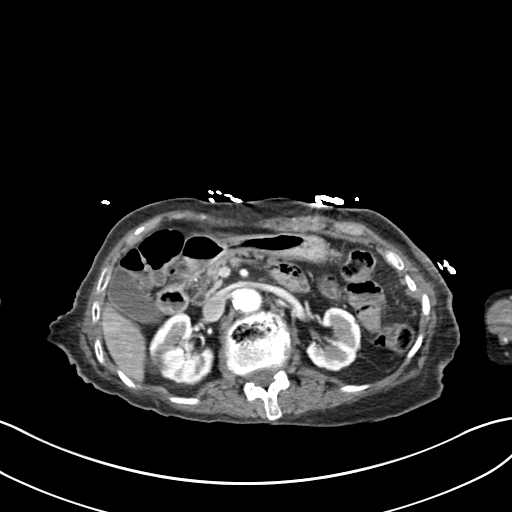
[im 64/79  soft-tissue]
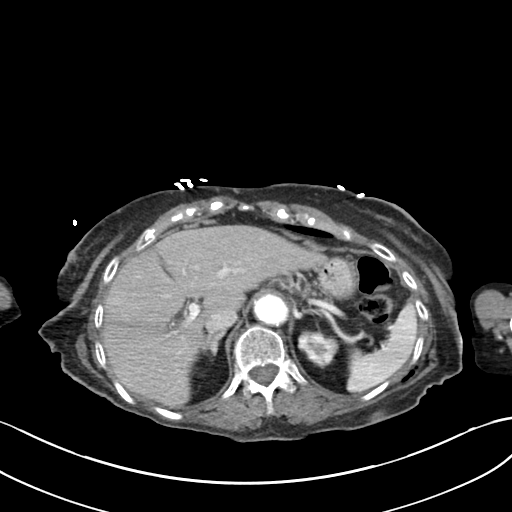
[im 67/79  soft-tissue]
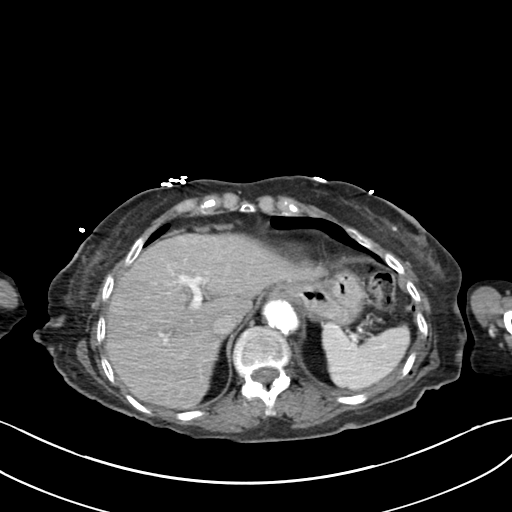
[im 75/79  soft-tissue]
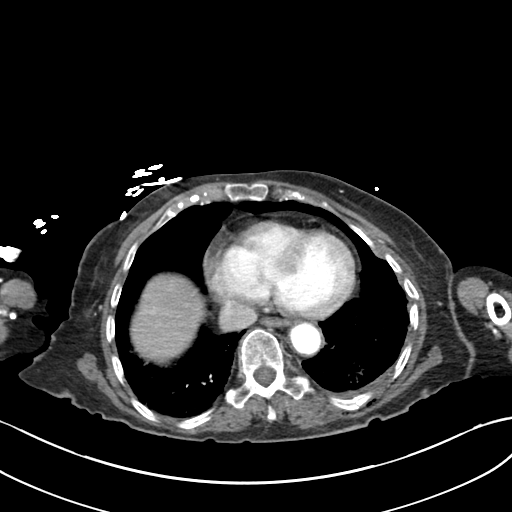

[Series 6: coronals · coronal · 0.65mm/px · 3 of 126 slices shown]
[im 42/126  soft-tissue]
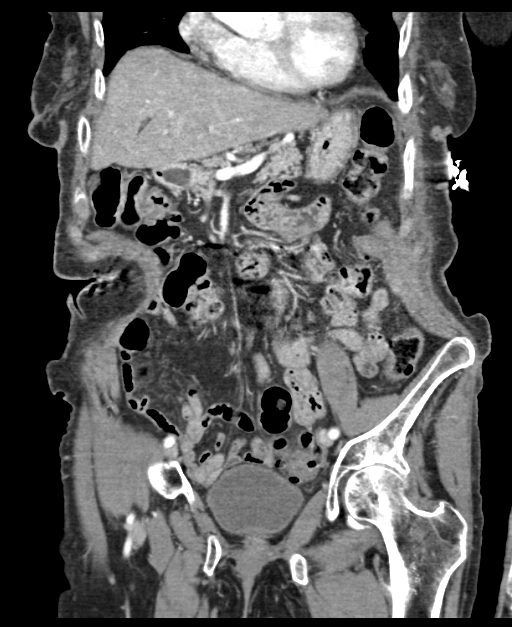
[im 56/126  soft-tissue]
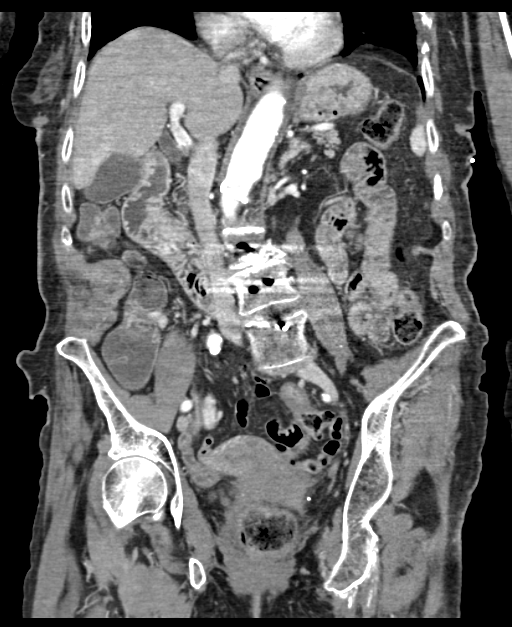
[im 70/126  soft-tissue]
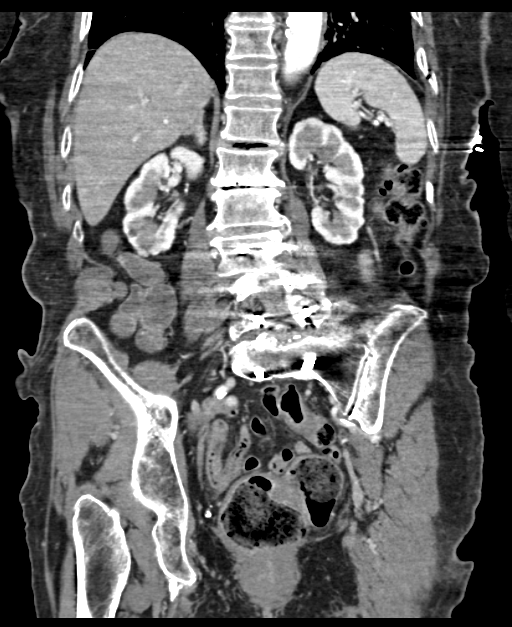

[16 of 46 positions shown; findings below may reference images not displayed]

FINDINGS: Lower chest: Multiple segmental rib fractures are identified
involving at least the left posterior eighth, ninth, and tenth ribs,
incompletely visualized. Moderate-sized left pneumothorax is
identified. No mediastinal shift. Contusion or compressive
atelectasis is identified at the left lung base as well as bullous
change or potentially No meta sealed.

Hepatobiliary: Streak artifact from the patient's arms and support
apparatus obscures detail. Too small to characterize dome of right
hepatic lobe 0.4 cm hypodense lesion is identified image 8. Probable
5 mm calcification or flash filling hemangioma noted at the dome of
the right hepatic lobe. Gallbladder unremarkable.

Pancreas: Partly fatty infiltrated, with fusiform prominence of the
pancreatic duct at the level of the head and body, measuring up to 3
mm maximally. No pancreatic mass identified.

Spleen: Normal

Adrenals/Urinary Tract: Adrenal glands appear normal. Lobulated
renal contour bilaterally, with numerous too small to characterize
hypodense lesions, likely cysts. Dominant right mid renal cortical
cyst measures 2.8 cm image 27. No hydroureteronephrosis.

Stomach/Bowel: Moderate stool burden. No bowel wall thickening or
focal segmental dilatation is identified.

Vascular/Lymphatic: Moderate atheromatous aortic calcification
without aneurysm. No lymphadenopathy.

Other: Uterus and ovaries appear normal.  No free air or fluid.

Musculoskeletal: Lumbar fusion hardware spanning L3-S1 is noted. No
evidence for hardware failure.
IMPRESSION: Multiple left-sided segmental rib fractures with moderate-sized left
pneumothorax.

No acute intra-abdominal or pelvic pathology.

Critical Value/emergent results were called by telephone at the time
of interpretation on 10/15/2014 at [DATE] to Aiichiro RN who verbally
acknowledged these results.

## 2016-08-25 ENCOUNTER — Other Ambulatory Visit: Payer: Self-pay | Admitting: Neurology

## 2016-08-25 DIAGNOSIS — R443 Hallucinations, unspecified: Secondary | ICD-10-CM

## 2016-08-25 DIAGNOSIS — G20 Parkinson's disease: Secondary | ICD-10-CM

## 2016-08-25 DIAGNOSIS — G4752 REM sleep behavior disorder: Secondary | ICD-10-CM

## 2016-08-25 DIAGNOSIS — R413 Other amnesia: Secondary | ICD-10-CM

## 2016-08-25 DIAGNOSIS — K5909 Other constipation: Secondary | ICD-10-CM

## 2016-08-25 DIAGNOSIS — R54 Age-related physical debility: Secondary | ICD-10-CM

## 2016-08-25 DIAGNOSIS — F0280 Dementia in other diseases classified elsewhere without behavioral disturbance: Secondary | ICD-10-CM

## 2016-10-02 ENCOUNTER — Ambulatory Visit (INDEPENDENT_AMBULATORY_CARE_PROVIDER_SITE_OTHER): Payer: Medicare Other | Admitting: Neurology

## 2016-10-02 ENCOUNTER — Encounter: Payer: Self-pay | Admitting: Neurology

## 2016-10-02 DIAGNOSIS — G20 Parkinson's disease: Secondary | ICD-10-CM

## 2016-10-02 DIAGNOSIS — R413 Other amnesia: Secondary | ICD-10-CM | POA: Diagnosis not present

## 2016-10-02 DIAGNOSIS — R443 Hallucinations, unspecified: Secondary | ICD-10-CM

## 2016-10-02 DIAGNOSIS — F0280 Dementia in other diseases classified elsewhere without behavioral disturbance: Secondary | ICD-10-CM

## 2016-10-02 DIAGNOSIS — R54 Age-related physical debility: Secondary | ICD-10-CM

## 2016-10-02 DIAGNOSIS — K5909 Other constipation: Secondary | ICD-10-CM | POA: Diagnosis not present

## 2016-10-02 DIAGNOSIS — G4752 REM sleep behavior disorder: Secondary | ICD-10-CM | POA: Diagnosis not present

## 2016-10-02 MED ORDER — CARBIDOPA-LEVODOPA 25-100 MG PO TABS: 1.0000 | ORAL_TABLET | Freq: Two times a day (BID) | ORAL | 3 refills | Status: DC

## 2016-10-02 NOTE — Patient Instructions (Signed)
You are doing the best you can. I would like to leave the medications for Parkinson's and her memory the same.  We can see you in 6 months, you can see one of our nurse practitioner and I will see you after that.

## 2016-10-02 NOTE — Progress Notes (Signed)
Subjective:    Patient ID: Kathleen Haynes is a 83 y.o. female.  HPI     Interim history:   Kathleen Haynes is a very pleasant 83 year old right-handed woman with an underlying medical history of hyperlipidemia, hypertension, OSA, chronic back pain, and C2 fracture from a fall, who presents for followup consultation of her left-sided predominant Parkinson's disease, complicated by severe hearing loss, urinary incontinence, chronic constipation, recurrent falls, memory loss, RBD, hallucinations, frailty, advancing age, neck pain, s/p neck surgery under Dr. Vertell Limber, motor fluctuations, motor complications, and complex sleep apnea (Hx of CPAP intolerance). Kathleen Haynes is accompanied by her husband again today. I last saw her on 03/29/2016 at which time Kathleen Haynes provided her little history, her husband reported that Kathleen Haynes had a fall. Kathleen Haynes went to the emergency room after a fall at the house. I reviewed the records. Kathleen Haynes had fallen in the bedroom and sustained injuries to her face but also unfortunately right hip fracture. This was managed conservatively, was seen by orthopedics. Kathleen Haynes had a head CT without contrast which showed no intracranial pathology. Kathleen Haynes was hospitalized from 11/30/2015 2 12/02/2015. I reviewed the hospital records and x-rays: IMPRESSION: Acute traumatic intertrochanteric right femoral neck fracture. Her husband reported that Kathleen Haynes was having ongoing issues with neck pain and headache, constipation was under control. Kathleen Haynes was not sleeping very well, Kathleen Haynes was restless and this was challenging for his sleep as well. He had installed a bed alarm as well as a baby monitor and was able to hear her throughout the night. I suggested we keep her Parkinson's medication the same. I suggested we try her on melatonin. I asked him to try to encourage her to drink ensure twice daily.  Today, 10/02/2016 (all dictated new, as well as above notes, some dictation done in note pad or Word, outside of chart, may appear as copied):  Kathleen Haynes  reports very little, but denies any pain. Her husband provides all her history. Continues to c/o head and neck pain, does not help very much with transfer and hardly walks at all. Takes tramadol as needed. Kathleen Haynes whines, but denies any specific pain typically, he says. Kathleen Haynes has VH, sees babies at night. Kathleen Haynes has not fallen. Uses a potty chair. Drinks ensure once daily. He has no other help, daughter helps, son comes by at times, GS age 106 lives close by.  Not much R hip pain.   The patient's allergies, current medications, family history, past medical history, past social history, past surgical history and problem list were reviewed and updated as appropriate.   Previously (copied from previous notes for reference):    I saw her on 09/30/2015, at which time her husband reported a recent fall. We talked about fall risk and gait safety at length at the time. I kept her medications the same, Kathleen Haynes was advised never to get out of bed or get up by herself.    I saw her on 05/19/2015 at which time Kathleen Haynes was essentially nonverbal. Kathleen Haynes had residual severe neck pain. Kathleen Haynes was on tramadol for this. Kathleen Haynes had seen Dr. Vertell Limber in follow-up for her neck pain but there was really nothing else he could do for her. Kathleen Haynes was on Robaxin up to twice daily and was sleepy during the day. I suggested we continue with her long-acting Namenda 14 mg daily, Aricept 5 mg daily, generic, and Sinemet 1 tablet twice daily. They were advised to try a microwavable heat pad for her neck pain.   I saw her on  01/15/2015, at which time her husband provided her entire history. Kathleen Haynes had improvement in her pain. Kathleen Haynes had finished home health therapy. Kathleen Haynes was sleep talking. Kathleen Haynes was not always compliant with her walker and had a tendency to get up and hold onto things for support. Kathleen Haynes was not always asking for help or waiting for help. I suggested that we continue her memory medications and Sinemet at the current doses. Kathleen Haynes was reminded to drink more water and  never to get up by herself and to use a walker only with additional assistance.   I saw her on 09/15/2014, at which time Kathleen Haynes reported improved constipation. Kathleen Haynes was advised to drink more water and less Pepsi. Kathleen Haynes was having vivid dreams. Kathleen Haynes was talking in her sleep. Kathleen Haynes had no significant issues with REM behavior disorder. Kathleen Haynes was using her walker or her wheelchair is a walker and sometimes was holding onto things at home. Her memory was stable. Kathleen Haynes was on Aricept 5 mg and Namenda long-acting 14 mg daily. Kathleen Haynes was on Sinemet twice daily. Kathleen Haynes was on Linzess once daily in the morning on an empty stomach. Kathleen Haynes was having ongoing visual hallucinations but they were stable. Their daughter lives next door and their son lives in the area as well. I suggested we continue with the same medication regimen.   Unfortunately, in the interim, Kathleen Haynes fell at home in the bathroom and injured herself. Kathleen Haynes was hospitalized in February. I reviewed the hospital records including discharge summary on test results. Kathleen Haynes was admitted on 10/15/2014 and discharged on 10/22/2014. Kathleen Haynes sustained a traumatic pneumothorax and rib fractures. Kathleen Haynes required left tube thoracostomy on 10/17/2014. Workup also included CT head and cervical spine without contrast on 10/15/2014: 1. No signs of significant acute traumatic injury to the skull, brain or cervical spine. 2. Chronic type 2 odontoid fracture redemonstrated with progressive degenerative changes at C1 and C2, as described above. In addition, there is multilevel degenerative disc disease and cervical spondylosis throughout other portions of the cervical spine as well. 3. 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 I personally reviewed the images through the PACS system and agree with the findings.   CT abdomen and pelvis with and without  contrast on 10/15/2014 showed: Multiple left-sided segmental rib fractures with moderate-sized left pneumothorax. No acute intra-abdominal or pelvic pathology.   Kathleen Haynes was discharged to home with home health services.    I saw her on 03/12/2014, at which time her husband reported that Kathleen Haynes does not like to wear her soft neck collar. Kathleen Haynes was using the wheelchair for a walker. He reported no recent falls. Kathleen Haynes was no longer on Aricept. Kathleen Haynes was having REM behavior disorder and nearly daily hallucinations. Her memory was deemed stable per him. Kathleen Haynes was on Namenda long-acting 14 mg once daily, carbidopa levodopa 1 pill twice daily, and I restarted her on Aricept 5 mg once daily. In the interim, Kathleen Haynes was seen by our nurse practitioner, Ms. Lam, on 06/16/2014, at which time Kathleen Haynes was continued on the current medication regimen and Kathleen Haynes was on Linzess for chronic constipation.   I saw her on 10/20/13, at which time I talked to them at length about her complex situation and limitation in what we can do with medical management. Kathleen Haynes had been on low-dose levodopa and needed adjustment in the past for hallucinations. We talked about her fall risk  and Kathleen Haynes was essentially wheelchair dependent at the time. I did not make any changes to her medications with the exception of changing her to long-acting Namenda once daily 14 mg.   Kathleen Haynes no longer sees Dr. Vertell Limber in [place]. Memory loss is stable. Kathleen Haynes has been tolerating the Namenda XR 14 mg. Kathleen Haynes is on C/L 1 pill twice daily. He could split the pills without crushing. He gives her Ensure as well. Their daughter helps out and their son lives a mile away and the 3 GC come and visit.    I first met her on 04/18/2013, which time I talked to her husband a long time about the complexity of her issues. I was reluctant to increase her levodopa for fear of side effects including exacerbation of her hallucinations.   Kathleen Haynes previously followed with Dr. Morene Antu and was last seen by Dr. Erling Cruz on  09/20/2012, at which time he started donepezil for dementia and did some blood work. He decreased her Sinemet to one tablet, alternating with half a tablet 4 times a day for a total of 300 mg of levodopa to help with dyskinesias and hallucinations. He did not start her on amantadine because of her dementia. Kathleen Haynes was also advised to start CPAP for obstructive sleep apnea. Kathleen Haynes has been on donepezil 5 mg daily, tramadol, meloxicam, multivitamin, stool softener, Detrol LA, pilocarpine, pravastatin, atenolol-chlorthalidone, Klor-Con, Sinemet 1 tablet 8, half a tablet at noon, 1 tablet at 4 PM and half a tablet at bedtime. Kathleen Haynes had B12, TSH, RPR tested in January 2014, which were normal.   Kathleen Haynes was diagnosed with left-sided Parkinson's disease in 1999 and started on Sinemet at the time. Kathleen Haynes started having dyskinesias and started using a rolling walker with a seat and a wheelchair. Kathleen Haynes has had recurrent falls and freezing spells and festination and did not improve with selegiline which was stopped. Kathleen Haynes developed constipation and urinary incontinence and does not exercise regularly. Kathleen Haynes had hallucinations and delusions. Kathleen Haynes fell and hit her head in May 2012 and was seen in the emergency room. Kathleen Haynes was found to have a C2 vertebral fracture and CT head without contrast showed generalized atrophy. Kathleen Haynes had surgery by Dr. Vertell Limber in July 2012 and August 2012 Kathleen Haynes started having memory loss in 2012. In December 2012 her MMSE was 24, clock drawing was 4, and normal fluency was 9. Kathleen Haynes does not sleep well at night and takes multiple naps during the day. Kathleen Haynes was diagnosed with complex sleep apnea in March 2012 without REM onset behavior disorder. Kathleen Haynes had a repeat sleep study in April 2012 for CPAP titration but then never started CPAP. Kathleen Haynes had frequent PLMS. Kathleen Haynes takes meloxicam and tramadol for her residual head and neck pain.   Kathleen Haynes saw Dr. Brett Fairy in April 2014, and was encouraged to use her CPAP. Dr. Brett Fairy reviewed her sleep test  results and compliance data with the patient and her husband at the time.   Her high cervical fracture was treated with a brace. Kathleen Haynes has a good support system with daughter living next door. Kathleen Haynes has hallucinations especially at night. Kathleen Haynes has not been using her CPAP and it was taken back. Kathleen Haynes continually pulled off the mask in the middle of the night and the husband would wake up with a leaking air noise. He has tried but has not been able to make her use the CPAP. Kathleen Haynes used it 19/30 days and mostly for 1 to 2 hours at a time and Kathleen Haynes still  had a high residual AHI. Kathleen Haynes has been on Namenda and Aricept. Kathleen Haynes has dyskinesias on the LLE. Of note, Kathleen Haynes has severe hearing loss and threw away 2 pairs of hearing aids.    Her Past Medical History Is Significant For: Past Medical History:  Diagnosis Date  . Back pain    lower back pain , C2 fracture from fall  . Dementia   . High cholesterol    since 2000  . Hypertension    since 1985  . Obstructive apnea   . Parkinson disease [facility])     Her Past Surgical History Is Significant For: Past Surgical History:  Procedure Laterality Date  . CERVICAL SPINE SURGERY     02/2011, 03/2011  . LUMBAR SPINE SURGERY    . rotary cuff Right   . SHOULDER SURGERY Left    [phone]    Her Family History Is Significant For: Family History  Problem Relation Age of Onset  . Cancer Mother   . Congestive Heart Failure Father   . Diabetes Other   . Hypertension Other   . Hyperlipidemia Other   . Heart disease Other     Her Social History Is Significant For: Social History   Social History  . Marital status: Married    Spouse name: [name]  . Number of children: 2  . Years of education: HS   Occupational History  .  Retired   Social History Main Topics  . Smoking status: Never Smoker  . Smokeless tobacco: Never Used  . Alcohol use No  . Drug use: No  . Sexual activity: Not Asked   Other Topics Concern  . None   Social History Narrative   Patient  lives at home with spouse.   Caffeine Use: 2 cups daily    Her Allergies Are:  No Known Allergies:   Her Current Medications Are:  Outpatient Encounter Prescriptions as of 10/02/2016  Medication Sig  . atenolol-chlorthalidone (TENORETIC) 50-25 MG per tablet Take 1 tablet by mouth daily.  . carbidopa-levodopa (SINEMET IR) 25-100 MG tablet Take 1 tablet by mouth 2 (two) times daily.  Marland Kitchen donepezil (ARICEPT) 5 MG tablet TAKE 1 TABLET BY MOUTH AT  BEDTIME  . NAMENDA XR 14 MG CP24 24 hr capsule TAKE 1 CAPSULE BY MOUTH  DAILY  . polyethylene glycol (MIRALAX / GLYCOLAX) packet Take 17 g by mouth 2 (two) times daily.  . potassium chloride SA (K-DUR,KLOR-CON) 20 MEQ tablet Take 20 mEq by mouth daily.  . pravastatin (PRAVACHOL) 80 MG tablet Take 80 mg by mouth daily.  . traMADol (ULTRAM) 50 MG tablet Take 1 tablet (50 mg total) by mouth every 6 (six) hours as needed for moderate pain.  . VESICARE 5 MG tablet    No facility-administered encounter medications on file as of 10/02/2016.   : Review of Systems:  Out of a complete 14 point review of systems, all are reviewed and negative with the exception of these symptoms as listed below:  Review of Systems  Neurological:       Pt presents today to discuss her memory. Pt is non verbal and in a wheelchair. Pt's husband reports that pt walks very infrequently at this point. Pt's husband reports that pt is experiencing neck pain and therefore, he uses a soft collar for her.    Objective:  Neurologic Exam  Physical Exam Physical Examination:   Vitals:   10/02/16 1309  BP: 120/82  Pulse: 60   General Examination: The patient is a  very pleasant 83 y.o. female in no acute distress. Kathleen Haynes appears frail and sits in her WC.   HEENT: Normocephalic, in soft brace. B/l cataract repairs. Funduscopic exam is normal with sharp disc margins noted. Extraocular tracking is impaired and hasDifficulty tracking. Kathleen Haynes is minimally tender on verbal. Hearing is highly  impaired. Neck mobility was not passively tested. Kathleen Haynes has minimal active movements in her neck. Oropharynx exam reveals mild mouth dryness, adequate dental hygiene. Tongue protrudes midline, Kathleen Haynes has no significant drooling. Kathleen Haynes has no oral facial dyskinesias. Kathleen Haynes has moderate facial masking and decrease in eye blink rate.  Chest: Clear to auscultation without wheezing, rhonchi or crackles noted.  Heart: S1+S2+0, regular and normal without murmurs, rubs or gallops noted.   Abdomen: Soft, non-tender and non-distended with normal bowel sounds appreciated on auscultation.  Extremities: There is no pitting edema in the distal lower extremities bilaterally. Pedal pulses are intact.  Skin: Warm and dry without trophic changes noted.   Musculoskeletal: exam reveals no obvious joint deformities, tenderness or joint swelling or erythema. Kathleen Haynes has a tendency to keep her left hand in a fist but can open it with verbal cues.  Neurologically:  Mental status: The patient is awake, paying spheres attention, is minimally to nonverbal. Kathleen Haynes is able to follow simple commands and answers a few questions with one-word sentences. Kathleen Haynes has otherwise impaired memory, language skills and fund of knowledge. Cranial nerves II - XII are as described above under HEENT exam. Kathleen Haynes has a tendency to lean to the right. Kathleen Haynes has minimal intermittent dyskinesias in the lower extremities. Tone is increased.  Motor exam: Thin bulk with increased tone and global strength of 4 out of 5. Reflexes are 1+ in the upper extremities and trace in the lower extremities. Fine motor skills are moderate to severely impaired globally, no significant lateralization noted. Sensory exam is intact to light touch.  Gait, station and balance: Kathleen Haynes is unable to stand or walk for me. Kathleen Haynes has a tendency to lean to the right in her wheelchair.  Assessment and Plan:   In summary, GRATIA DISLA is a very pleasant 83 y.o.-year old female with An underlying  complex medical history of chronic pain, C2 fracture from a fall with residual head and neck pain, abnormal neck posture, a histo hyperlipidemia, history of sleep apnea, hypertension, who presents for follow-up consultation of her left-sided predominant Parkinson's disease, complicated by multiple factors including advancing age, frailty, deconditioning, severe pain, constipation, hearing loss, REM behavior disorder, hallucinations and motor fluctuations. Kathleen Haynes has had multiple falls with injuries. Kathleen Haynes is practically wheelchair bound. Unfortunately, there is not a whole lot we can do at this time. Medication options are limited because of age and medication side effects including dyskinesias and hallucinations. Kathleen Haynes continues to take low-dose Sinemet which I renewed today. Constipation is under reasonable control with MiraLAX on a daily basis. Kathleen Haynes still is restless at night and tends to talk in her sleep and stirs a lot and he can hear her all night long on the baby monitor. He gives her tramadol as needed for pain but tries to limited until he can tell that Kathleen Haynes really is in pain. Otherwise, Kathleen Haynes has a tendency to moan and groan and does not seem to be in distress at times. We will continue with low-dose donepezil and Namenda long-acting once daily. I suggested a 6 month follow-up, they can see one of our nurse practitioners at the time and I will see her back after that. I  asked him to continue to do the best he can. Unfortunately, he is her only pain caretaker, however, daughter lives nearby and grandson lives nearby as well, son checks in with him as well.  I answered all his questions today and the patient's husband was in agreement.  I spent 20 minutes in total face-to-face time with the patient, more than 50% of which was spent in counseling and coordination of care, reviewing test results, reviewing medication and discussing or reviewing the diagnosis of PD, and the complications of adv PD, the prognosis and  treatment options. Pertinent laboratory and imaging test results that were available during this visit with the patient were reviewed by me and considered in my medical decision making (see chart for details).

## 2017-04-04 ENCOUNTER — Ambulatory Visit (INDEPENDENT_AMBULATORY_CARE_PROVIDER_SITE_OTHER): Payer: Medicare Other | Admitting: Adult Health

## 2017-04-04 ENCOUNTER — Encounter: Payer: Self-pay | Admitting: Adult Health

## 2017-04-04 VITALS — BP 128/75 | HR 58 | Wt 105.6 lb

## 2017-04-04 DIAGNOSIS — G20 Parkinson's disease: Secondary | ICD-10-CM

## 2017-04-04 DIAGNOSIS — R413 Other amnesia: Secondary | ICD-10-CM

## 2017-04-04 NOTE — Progress Notes (Deleted)
PATIENT: Kathleen Haynes DOB: 09-03-1926  REASON FOR VISIT: follow up- parkinson's disease HISTORY FROM: patient  HISTORY OF PRESENT ILLNESS: HISTORY Copied from Dr. Guadelupe Sabin notes:Kathleen Haynes is a very pleasant 83 year old right-handed woman with an underlying medical history of hyperlipidemia, hypertension, OSA, chronic back pain, and C2 fracture from a fall, who presents for followup consultation of her left-sided predominant Parkinson's disease, complicated by severe hearing loss, urinary incontinence, chronic constipation, recurrent falls, memory loss, RBD, hallucinations, frailty, advancing age, neck pain, s/p neck surgery under Dr. Vertell Limber, motor fluctuations, motor complications, and complex sleep apnea (Hx of CPAP intolerance). She is accompanied by her husband again today. I last saw her on 03/29/2016 at which time she provided her little history, her husband reported that she had a fall. She went to the emergency room after a fall at the house. I reviewed the records. She had fallen in the bedroom and sustained injuries to her face but also unfortunately right hip fracture. This was managed conservatively, was seen by orthopedics. She had a head CT without contrast which showed no intracranial pathology. She was hospitalized from 11/30/2015 2 12/02/2015. I reviewed the hospital records and x-rays: IMPRESSION: Acute traumatic intertrochanteric right femoral neck fracture. Her husband reported that she was having ongoing issues with neck pain and headache, constipation was under control. She was not sleeping very well, she was restless and this was challenging for his sleep as well. He had installed a bed alarm as well as a baby monitor and was able to hear her throughout the night. I suggested we keep her Parkinson's medication the same. I suggested we try her on melatonin. I asked him to try to encourage her to drink ensure twice daily.   10/02/2016: She reports very little, but denies any  pain. Her husband provides all her history. Continues to c/o head and neck pain, does not help very much with transfer and hardly walks at all. Takes tramadol as needed. She whines, but denies any specific pain typically, he says. She has VH, sees babies at night. She has not fallen. Uses a potty chair. Drinks ensure once daily. He has no other help, daughter helps, son comes by at times, GS age 48 lives close by.  Not much R hip pain.   The patient's allergies, current medications, family history, past medical history, past social history, past surgical history and problem list were reviewed and updated as appropriate.    REVIEW OF SYSTEMS: Out of a complete 14 system review of symptoms, the patient complains only of the following symptoms, and all other reviewed systems are negative.  ALLERGIES: No Known Allergies  HOME MEDICATIONS: Outpatient Medications Prior to Visit  Medication Sig Dispense Refill  . atenolol-chlorthalidone (TENORETIC) 50-25 MG per tablet Take 1 tablet by mouth daily.    . carbidopa-levodopa (SINEMET IR) 25-100 MG tablet Take 1 tablet by mouth 2 (two) times daily. 180 tablet 3  . donepezil (ARICEPT) 5 MG tablet TAKE 1 TABLET BY MOUTH AT  BEDTIME 90 tablet 3  . NAMENDA XR 14 MG CP24 24 hr capsule TAKE 1 CAPSULE BY MOUTH  DAILY 90 capsule 3  . polyethylene glycol (MIRALAX / GLYCOLAX) packet Take 17 g by mouth 2 (two) times daily. 14 each 0  . potassium chloride SA (K-DUR,KLOR-CON) 20 MEQ tablet Take 20 mEq by mouth daily.    . pravastatin (PRAVACHOL) 80 MG tablet Take 80 mg by mouth daily.    . traMADol (ULTRAM) 50 MG tablet Take  1 tablet (50 mg total) by mouth every 6 (six) hours as needed for moderate pain. 30 tablet 0  . VESICARE 5 MG tablet      No facility-administered medications prior to visit.     PAST MEDICAL HISTORY: Past Medical History:  Diagnosis Date  . Back pain    lower back pain , C2 fracture from fall  . Dementia   . High cholesterol    since  2000  . Hypertension    since 1985  . Obstructive apnea   . Parkinson disease ([place])     PAST SURGICAL HISTORY: Past Surgical History:  Procedure Laterality Date  . CERVICAL SPINE SURGERY     02/2011, 03/2011  . LUMBAR SPINE SURGERY    . rotary cuff Right   . SHOULDER SURGERY Left    [phone]    FAMILY HISTORY: Family History  Problem Relation Age of Onset  . Cancer Mother   . Congestive Heart Failure Father   . Diabetes Other   . Hypertension Other   . Hyperlipidemia Other   . Heart disease Other     SOCIAL HISTORY: Social History   Social History  . Marital status: Married    Spouse name: [name]  . Number of children: 2  . Years of education: HS   Occupational History  .  Retired   Social History Main Topics  . Smoking status: Never Smoker  . Smokeless tobacco: Never Used  . Alcohol use No  . Drug use: No  . Sexual activity: Not on file   Other Topics Concern  . Not on file   Social History Narrative   Patient lives at home with spouse.   Caffeine Use: 2 cups daily      PHYSICAL EXAM  There were no vitals filed for this visit. There is no height or weight on file to calculate BMI.  Generalized: Well developed, in no acute distress   Neurological examination  Mentation: Alert oriented to time, place, history taking. Follows all commands speech and language fluent Cranial nerve II-XII: Pupils were equal round reactive to light. Extraocular movements were full, visual field were full on confrontational test. Facial sensation and strength were normal. Uvula tongue midline. Head turning and shoulder shrug  were normal and symmetric. Motor: The motor testing reveals 5 over 5 strength of all 4 extremities. Good symmetric motor tone is noted throughout.  Sensory: Sensory testing is intact to soft touch on all 4 extremities. No evidence of extinction is noted.  Coordination: Cerebellar testing reveals good finger-nose-finger and heel-to-shin bilaterally.   Gait and station: Gait is normal. Tandem gait is normal. Romberg is negative. No drift is seen.  Reflexes: Deep tendon reflexes are symmetric and normal bilaterally.   DIAGNOSTIC DATA (LABS, IMAGING, TESTING) - I reviewed patient records, labs, notes, testing and imaging myself where available.  Lab Results  Component Value Date   WBC 5.9 12/02/2015   HGB 12.3 12/02/2015   HCT 36.5 12/02/2015   MCV 88.2 12/02/2015   PLT 294 12/02/2015      Component Value Date/Time   NA 139 12/02/2015 0621   K 3.6 12/02/2015 0621   CL 98 (L) 12/02/2015 0621   CO2 24 12/02/2015 0621   GLUCOSE 57 (L) 12/02/2015 0621   BUN 21 (H) 12/02/2015 0621   CREATININE 1.16 (H) 12/02/2015 0621   CALCIUM 9.6 12/02/2015 0621   PROT 6.8 12/02/2015 0621   ALBUMIN 3.1 (L) 12/02/2015 0621   AST 29 12/02/2015 1660  ALT 8 (L) 12/02/2015 0621   ALKPHOS 211 (H) 12/02/2015 0621   BILITOT 0.9 12/02/2015 0621   GFRNONAA 41 (L) 12/02/2015 0621   GFRAA 48 (L) 12/02/2015 3244   No results found for: CHOL, HDL, LDLCALC, LDLDIRECT, TRIG, CHOLHDL No results found for: HGBA1C No results found for: VITAMINB12 No results found for: TSH    ASSESSMENT AND PLAN 83 y.o. year old female  has a past medical history of Back pain; Dementia; High cholesterol; Hypertension; Obstructive apnea; and Parkinson disease ([place]). here with ***     Ward Givens, MSN, NP-C 04/04/2017, 11:42 AM [facility] [address], [place], [place] [address] [phone]

## 2017-04-04 NOTE — Patient Instructions (Signed)
Continue Low-dose Sinemet Continue Aricept and Namenda If your symptoms worsen or you develop new symptoms please let us know.

## 2017-04-04 NOTE — Progress Notes (Addendum)
PATIENT: Kathleen Haynes DOB: 10/11/1926  REASON FOR VISIT: follow up- parkinson's disease HISTORY FROM: patient  HISTORY OF PRESENT ILLNESS: Today 04/04/17 Kathleen Haynes is an 83 year old female with a history of Parkinson's disease and memory disturbance. She returns today for follow-up. The patient is here with her husband. He provides most of the history. He states overall things have remained stable. He states that she has not gotten any worse. She does require assistance with most ADLs. He reports that she is able to feed herself. She is able to put her clothes on and wash herself off. She does need help with transfers. He states that she is nonambulatory. She primarily uses a wheelchair. He states that she has not had any recent falls however she has slid off the bed. Fortunately she did not sustain any injuries. He continues to give her tramadol for neck pain. He states that his daughter lives next door and is able to help some.  HISTORY Copied from Dr. Guadelupe Sabin notes:Kathleen Haynes is a very pleasant 83 year old right-handed woman with an underlying medical history of hyperlipidemia, hypertension, OSA, chronic back pain, and C2 fracture from a fall, who presents for followup consultation of her left-sided predominant Parkinson's disease, complicated by severe hearing loss, urinary incontinence, chronic constipation, recurrent falls, memory loss, RBD, hallucinations, frailty, advancing age, neck pain, s/p neck surgery under Dr. Vertell Limber, motor fluctuations, motor complications, and complex sleep apnea (Hx of CPAP intolerance). She is accompanied by her husband again today. I last saw her on 03/29/2016 at which time she provided her little history, her husband reported that she had a fall. She went to the emergency room after a fall at the house. I reviewed the records. She had fallen in the bedroom and sustained injuries to her face but also unfortunately right hip fracture. This was managed  conservatively, was seen by orthopedics. She had a head CT without contrast which showed no intracranial pathology. She was hospitalized from 11/30/2015 2 12/02/2015. I reviewed the hospital records and x-rays: IMPRESSION: Acute traumatic intertrochanteric right femoral neck fracture. Her husband reported that she was having ongoing issues with neck pain and headache, constipation was under control. She was not sleeping very well, she was restless and this was challenging for his sleep as well. He had installed a bed alarm as well as a baby monitor and was able to hear her throughout the night. I suggested we keep her Parkinson's medication the same. I suggested we try her on melatonin. I asked him to try to encourage her to drink ensure twice daily.   10/02/2016: She reports very little, but denies any pain. Her husband provides all her history. Continues to c/o head and neck pain, does not help very much with transfer and hardly walks at all. Takes tramadol as needed. She whines, but denies any specific pain typically, he says. She has VH, sees babies at night. She has not fallen. Uses a potty chair. Drinks ensure once daily. He has no other help, daughter helps, son comes by at times, GS age 106 lives close by.  Not much R hip pain.   The patient's allergies, current medications, family history, past medical history, past social history, past surgical history and problem list were reviewed and updated as appropriate.    REVIEW OF SYSTEMS: Out of a complete 14 system review of symptoms, the patient complains only of the following symptoms, and all other reviewed systems are negative.  Memory loss, headache, speech difficulty, daytime sleepiness,  sleep talking, moles, shortness of breath, hearing loss  ALLERGIES: No Known Allergies  HOME MEDICATIONS: Outpatient Medications Prior to Visit  Medication Sig Dispense Refill  . atenolol-chlorthalidone (TENORETIC) 50-25 MG per tablet Take 1 tablet by  mouth daily.    . carbidopa-levodopa (SINEMET IR) 25-100 MG tablet Take 1 tablet by mouth 2 (two) times daily. 180 tablet 3  . donepezil (ARICEPT) 5 MG tablet TAKE 1 TABLET BY MOUTH AT  BEDTIME 90 tablet 3  . NAMENDA XR 14 MG CP24 24 hr capsule TAKE 1 CAPSULE BY MOUTH  DAILY 90 capsule 3  . polyethylene glycol (MIRALAX / GLYCOLAX) packet Take 17 g by mouth 2 (two) times daily. 14 each 0  . potassium chloride SA (K-DUR,KLOR-CON) 20 MEQ tablet Take 20 mEq by mouth daily.    . pravastatin (PRAVACHOL) 80 MG tablet Take 80 mg by mouth daily.    . traMADol (ULTRAM) 50 MG tablet Take 1 tablet (50 mg total) by mouth every 6 (six) hours as needed for moderate pain. 30 tablet 0  . VESICARE 5 MG tablet      No facility-administered medications prior to visit.     PAST MEDICAL HISTORY: Past Medical History:  Diagnosis Date  . Back pain    lower back pain , C2 fracture from fall  . Dementia   . High cholesterol    since 2000  . Hypertension    since 1985  . Obstructive apnea   . Parkinson disease ([place])     PAST SURGICAL HISTORY: Past Surgical History:  Procedure Laterality Date  . CERVICAL SPINE SURGERY     02/2011, 03/2011  . LUMBAR SPINE SURGERY    . rotary cuff Right   . SHOULDER SURGERY Left    [phone]    FAMILY HISTORY: Family History  Problem Relation Age of Onset  . Cancer Mother   . Congestive Heart Failure Father   . Diabetes Other   . Hypertension Other   . Hyperlipidemia Other   . Heart disease Other     SOCIAL HISTORY: Social History   Social History  . Marital status: Married    Spouse name: [name]  . Number of children: 2  . Years of education: HS   Occupational History  .  Retired   Social History Main Topics  . Smoking status: Never Smoker  . Smokeless tobacco: Never Used  . Alcohol use No  . Drug use: No  . Sexual activity: Not on file   Other Topics Concern  . Not on file   Social History Narrative   Patient lives at home with spouse.    Caffeine Use: 2 cups daily      PHYSICAL EXAM  Vitals:   04/04/17 1252  BP: 128/75  Pulse: (!) 58  Weight: 105 lb 9.6 oz (47.9 kg)   Body mass index is 18.71 kg/m.  Generalized: Well developed, in no acute distress   Neurological examination  Mentation: Alert. Oriented to person and place Follows all commands. Communication is limited. Cranial nerve II-XII: Pupils were equal round reactive to light. Extraocular movements were full, visual field were full on confrontational test. Facial sensation and strength were normal. Uvula tongue midline. Head turning and shoulder shrug  were normal and symmetric. Hearing deficit bilaterally Motor: The motor testing reveals 5 over 5 strength of all 4 extremities. Good symmetric motor tone is noted throughout.  Sensory: Sensory testing is intact to soft touch on all 4 extremities. No evidence of extinction is  noted.  Coordination: Cerebellar testing reveals good finger-nose-finger Gait and station: Patient in a wheelchair.   DIAGNOSTIC DATA (LABS, IMAGING, TESTING) - I reviewed patient records, labs, notes, testing and imaging myself where available.  Lab Results  Component Value Date   WBC 5.9 12/02/2015   HGB 12.3 12/02/2015   HCT 36.5 12/02/2015   MCV 88.2 12/02/2015   PLT 294 12/02/2015      Component Value Date/Time   NA 139 12/02/2015 0621   K 3.6 12/02/2015 0621   CL 98 (L) 12/02/2015 0621   CO2 24 12/02/2015 0621   GLUCOSE 57 (L) 12/02/2015 0621   BUN 21 (H) 12/02/2015 0621   CREATININE 1.16 (H) 12/02/2015 0621   CALCIUM 9.6 12/02/2015 0621   PROT 6.8 12/02/2015 0621   ALBUMIN 3.1 (L) 12/02/2015 0621   AST 29 12/02/2015 0621   ALT 8 (L) 12/02/2015 0621   ALKPHOS 211 (H) 12/02/2015 0621   BILITOT 0.9 12/02/2015 0621   GFRNONAA 41 (L) 12/02/2015 0621   GFRAA 48 (L) 12/02/2015 6378      ASSESSMENT AND PLAN 83 y.o. year old female  has a past medical history of Back pain; Dementia; High cholesterol; Hypertension;  Obstructive apnea; and Parkinson disease ([place]). here with:  1. Parkinson's disease  2. Memory disturbance  The patient overall has remained stable. She will continue on Sinemet 25-100 milligrams 1 tablet twice a day. She will also remain on Aricept and Namenda. We were unable to complete memory testing today. I gave the patient's husband the number to [employer] as a potential resource for more assistance in the home. Patient and her husband advised that if her symptoms worsen or she develops new symptoms he should let us know. She will follow-up in 6 months or sooner if needed.    Ward Givens, MSN, NP-C 04/04/2017, 11:42 AM [facility] [address] [place], [place] [address] [phone]  I reviewed the above note and documentation by the Nurse Practitioner and agree with the history, physical exam, assessment and plan as outlined above. I was immediately available for face-to-face consultation. Star Age, MD, PhD [facility])

## 2017-04-19 DIAGNOSIS — G20 Parkinson's disease: Secondary | ICD-10-CM | POA: Diagnosis not present

## 2017-04-19 DIAGNOSIS — N3281 Overactive bladder: Secondary | ICD-10-CM | POA: Diagnosis not present

## 2017-04-19 DIAGNOSIS — I10 Essential (primary) hypertension: Secondary | ICD-10-CM | POA: Diagnosis not present

## 2017-04-19 DIAGNOSIS — G309 Alzheimer's disease, unspecified: Secondary | ICD-10-CM | POA: Diagnosis not present

## 2017-04-27 DIAGNOSIS — H35033 Hypertensive retinopathy, bilateral: Secondary | ICD-10-CM | POA: Diagnosis not present

## 2017-04-27 DIAGNOSIS — Z961 Presence of intraocular lens: Secondary | ICD-10-CM | POA: Diagnosis not present

## 2017-04-27 DIAGNOSIS — H02105 Unspecified ectropion of left lower eyelid: Secondary | ICD-10-CM | POA: Diagnosis not present

## 2017-06-21 DIAGNOSIS — Z23 Encounter for immunization: Secondary | ICD-10-CM | POA: Diagnosis not present

## 2017-06-21 DIAGNOSIS — I10 Essential (primary) hypertension: Secondary | ICD-10-CM | POA: Diagnosis not present

## 2017-06-21 DIAGNOSIS — Z0000 Encounter for general adult medical examination without abnormal findings: Secondary | ICD-10-CM | POA: Diagnosis not present

## 2017-06-21 DIAGNOSIS — E559 Vitamin D deficiency, unspecified: Secondary | ICD-10-CM | POA: Diagnosis not present

## 2017-06-28 DIAGNOSIS — Z0000 Encounter for general adult medical examination without abnormal findings: Secondary | ICD-10-CM | POA: Diagnosis not present

## 2017-06-28 DIAGNOSIS — M542 Cervicalgia: Secondary | ICD-10-CM | POA: Diagnosis not present

## 2017-06-29 ENCOUNTER — Other Ambulatory Visit: Payer: Self-pay | Admitting: Neurology

## 2017-06-29 DIAGNOSIS — R54 Age-related physical debility: Secondary | ICD-10-CM

## 2017-06-29 DIAGNOSIS — K5909 Other constipation: Secondary | ICD-10-CM

## 2017-06-29 DIAGNOSIS — G4752 REM sleep behavior disorder: Secondary | ICD-10-CM

## 2017-06-29 DIAGNOSIS — G20 Parkinson's disease: Secondary | ICD-10-CM

## 2017-06-29 DIAGNOSIS — R443 Hallucinations, unspecified: Secondary | ICD-10-CM

## 2017-06-29 DIAGNOSIS — F0280 Dementia in other diseases classified elsewhere without behavioral disturbance: Secondary | ICD-10-CM

## 2017-06-29 DIAGNOSIS — G20A1 Parkinson's disease without dyskinesia, without mention of fluctuations: Secondary | ICD-10-CM

## 2017-06-29 DIAGNOSIS — R413 Other amnesia: Secondary | ICD-10-CM

## 2017-09-03 DIAGNOSIS — L570 Actinic keratosis: Secondary | ICD-10-CM | POA: Diagnosis not present

## 2017-09-03 DIAGNOSIS — D485 Neoplasm of uncertain behavior of skin: Secondary | ICD-10-CM | POA: Diagnosis not present

## 2017-09-03 DIAGNOSIS — L820 Inflamed seborrheic keratosis: Secondary | ICD-10-CM | POA: Diagnosis not present

## 2017-09-07 ENCOUNTER — Other Ambulatory Visit: Payer: Self-pay | Admitting: Neurology

## 2017-09-07 DIAGNOSIS — R443 Hallucinations, unspecified: Secondary | ICD-10-CM

## 2017-09-07 DIAGNOSIS — R413 Other amnesia: Secondary | ICD-10-CM

## 2017-09-07 DIAGNOSIS — R54 Age-related physical debility: Secondary | ICD-10-CM

## 2017-09-07 DIAGNOSIS — F0280 Dementia in other diseases classified elsewhere without behavioral disturbance: Secondary | ICD-10-CM

## 2017-09-07 DIAGNOSIS — K5909 Other constipation: Secondary | ICD-10-CM

## 2017-09-07 DIAGNOSIS — G4752 REM sleep behavior disorder: Secondary | ICD-10-CM

## 2017-09-07 DIAGNOSIS — G20 Parkinson's disease: Secondary | ICD-10-CM

## 2017-09-17 ENCOUNTER — Other Ambulatory Visit: Payer: Self-pay | Admitting: Neurology

## 2017-09-17 DIAGNOSIS — R54 Age-related physical debility: Secondary | ICD-10-CM

## 2017-09-17 DIAGNOSIS — F0280 Dementia in other diseases classified elsewhere without behavioral disturbance: Secondary | ICD-10-CM

## 2017-09-17 DIAGNOSIS — R443 Hallucinations, unspecified: Secondary | ICD-10-CM

## 2017-09-17 DIAGNOSIS — G20 Parkinson's disease: Secondary | ICD-10-CM

## 2017-09-17 DIAGNOSIS — R413 Other amnesia: Secondary | ICD-10-CM

## 2017-09-17 DIAGNOSIS — K5909 Other constipation: Secondary | ICD-10-CM

## 2017-09-17 DIAGNOSIS — G4752 REM sleep behavior disorder: Secondary | ICD-10-CM

## 2017-10-01 ENCOUNTER — Other Ambulatory Visit: Payer: Self-pay

## 2017-10-01 DIAGNOSIS — G4752 REM sleep behavior disorder: Secondary | ICD-10-CM

## 2017-10-01 DIAGNOSIS — F0280 Dementia in other diseases classified elsewhere without behavioral disturbance: Secondary | ICD-10-CM

## 2017-10-01 DIAGNOSIS — K5909 Other constipation: Secondary | ICD-10-CM

## 2017-10-01 DIAGNOSIS — R443 Hallucinations, unspecified: Secondary | ICD-10-CM

## 2017-10-01 DIAGNOSIS — R54 Age-related physical debility: Secondary | ICD-10-CM

## 2017-10-01 DIAGNOSIS — G20 Parkinson's disease: Secondary | ICD-10-CM

## 2017-10-01 DIAGNOSIS — R413 Other amnesia: Secondary | ICD-10-CM

## 2017-10-01 MED ORDER — CARBIDOPA-LEVODOPA 25-100 MG PO TABS: 1.0000 | ORAL_TABLET | Freq: Two times a day (BID) | ORAL | 0 refills | Status: DC

## 2017-10-08 ENCOUNTER — Encounter: Payer: Self-pay | Admitting: Neurology

## 2017-10-08 ENCOUNTER — Encounter (INDEPENDENT_AMBULATORY_CARE_PROVIDER_SITE_OTHER): Payer: Self-pay

## 2017-10-08 ENCOUNTER — Ambulatory Visit: Payer: Medicare Other | Admitting: Neurology

## 2017-10-08 VITALS — BP 120/68 | HR 88

## 2017-10-08 DIAGNOSIS — R5381 Other malaise: Secondary | ICD-10-CM

## 2017-10-08 DIAGNOSIS — R54 Age-related physical debility: Secondary | ICD-10-CM

## 2017-10-08 DIAGNOSIS — F0280 Dementia in other diseases classified elsewhere without behavioral disturbance: Secondary | ICD-10-CM | POA: Diagnosis not present

## 2017-10-08 DIAGNOSIS — G20 Parkinson's disease: Secondary | ICD-10-CM | POA: Diagnosis not present

## 2017-10-08 NOTE — Progress Notes (Signed)
Subjective:    Patient ID: Kathleen Haynes is a 84 y.o. female.  HPI     Interim history:   Kathleen Haynes is a very pleasant 84 year old right-handed woman with an underlying medical history of hyperlipidemia, hypertension, OSA, chronic back pain, and C2 fracture from a fall, who presents for followup consultation of her left-sided predominant Parkinson's disease, complicated by severe hearing loss (no more hearing aids), urinary incontinence, chronic constipation, recurrent falls, memory loss, RBD, hallucinations, frailty, advancing age, neck pain, s/p neck surgery under Dr. Vertell Limber, motor fluctuations, motor complications, and complex sleep apnea (Hx of CPAP intolerance). She is accompanied by her husband again today. I last saw her on 10/02/2016, at which time her husband reported that she still had ongoing head and neck pain. He was not always sure if she was in severe pain or not. She had no recent falls.  She saw Ward Givens, nurse practitioner in the interim on 04/04/2017, at which time she was advised to continue with her current doses of Sinemet, Aricept and Namenda.  Today, 10/02/2016 (all dictated new, as well as above notes, some dictation done in note pad or Word, outside of chart, may appear as copied):   She reports very little, essentially nonverbal. Brief episode of crying, husband reports that she starts crying sometimes. He has no new issues to report other than it is difficult for him to help her on a day-to-day basis. He does not have any home health agency. His daughter lives next or and helps out. There is some helps out as well. He does not leave her at home alone and has someone with her when he needs to go somewhere. It is difficult for him to stand and transfer. She does seem to complain about neck pain but it is hard to tell for him. Sometimes she starts crying and cannot tell them why. No recent acute illness, no recent falls. Constipation is under decent control.     The  patient's allergies, current medications, family history, past medical history, past social history, past surgical history and problem list were reviewed and updated as appropriate.    Previously (copied from previous notes for reference):    I saw her on 03/29/2016 at which time she provided her little history, her husband reported that she had a fall. She went to the emergency room after a fall at the house. I reviewed the records. She had fallen in the bedroom and sustained injuries to her face but also unfortunately right hip fracture. This was managed conservatively, was seen by orthopedics. She had a head CT without contrast which showed no intracranial pathology. She was hospitalized from 11/30/2015 2 12/02/2015. I reviewed the hospital records and x-rays: IMPRESSION: Acute traumatic intertrochanteric right femoral neck fracture. Her husband reported that she was having ongoing issues with neck pain and headache, constipation was under control. She was not sleeping very well, she was restless and this was challenging for his sleep as well. He had installed a bed alarm as well as a baby monitor and was able to hear her throughout the night. I suggested we keep her Parkinson's medication the same. I suggested we try her on melatonin. I asked him to try to encourage her to drink ensure twice daily.    I saw her on 09/30/2015, at which time her husband reported a recent fall. We talked about fall risk and gait safety at length at the time. I kept her medications the same, she was advised never  to get out of bed or get up by herself.    I saw her on 05/19/2015 at which time she was essentially nonverbal. She had residual severe neck pain. She was on tramadol for this. She had seen Dr. Vertell Limber in follow-up for her neck pain but there was really nothing else he could do for her. She was on Robaxin up to twice daily and was sleepy during the day. I suggested we continue with her long-acting Namenda 14 mg  daily, Aricept 5 mg daily, generic, and Sinemet 1 tablet twice daily. They were advised to try a microwavable heat pad for her neck pain.   I saw her on 01/15/2015, at which time her husband provided her entire history. She had improvement in her pain. She had finished home health therapy. She was sleep talking. She was not always compliant with her walker and had a tendency to get up and hold onto things for support. She was not always asking for help or waiting for help. I suggested that we continue her memory medications and Sinemet at the current doses. She was reminded to drink more water and never to get up by herself and to use a walker only with additional assistance.   I saw her on 09/15/2014, at which time she reported improved constipation. She was advised to drink more water and less Pepsi. She was having vivid dreams. She was talking in her sleep. She had no significant issues with REM behavior disorder. She was using her walker or her wheelchair is a walker and sometimes was holding onto things at home. Her memory was stable. She was on Aricept 5 mg and Namenda long-acting 14 mg daily. She was on Sinemet twice daily. She was on Linzess once daily in the morning on an empty stomach. She was having ongoing visual hallucinations but they were stable. Their daughter lives next door and their son lives in the area as well. I suggested we continue with the same medication regimen.   Unfortunately, in the interim, she fell at home in the bathroom and injured herself. She was hospitalized in February. I reviewed the hospital records including discharge summary on test results. She was admitted on 10/15/2014 and discharged on 10/22/2014. She sustained a traumatic pneumothorax and rib fractures. She required left tube thoracostomy on 10/17/2014. Workup also included CT head and cervical spine without contrast on 10/15/2014: 1. No signs of significant acute traumatic injury to the skull, brain or cervical  spine. 2. Chronic type 2 odontoid fracture redemonstrated with progressive degenerative changes at C1 and C2, as described above. In addition, there is multilevel degenerative disc disease and cervical spondylosis throughout other portions of the cervical spine as well. 3. 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 I personally reviewed the images through the PACS system and agree with the findings.   CT abdomen and pelvis with and without contrast on 10/15/2014 showed: Multiple left-sided segmental rib fractures with moderate-sized left pneumothorax. No acute intra-abdominal or pelvic pathology.   She was discharged to home with home health services.    I saw her on 03/12/2014, at which time her husband reported that she does not like to wear her soft neck collar. She was using the wheelchair for a walker. He reported no recent falls. She was no longer on Aricept. She was having REM behavior disorder and nearly daily hallucinations. Her  memory was deemed stable per him. She was on Namenda long-acting 14 mg once daily, carbidopa levodopa 1 pill twice daily, and I restarted her on Aricept 5 mg once daily. In the interim, she was seen by our nurse practitioner, Ms. Lam, on 06/16/2014, at which time she was continued on the current medication regimen and she was on Linzess for chronic constipation.   I saw her on 10/20/13, at which time I talked to them at length about her complex situation and limitation in what we can do with medical management. She had been on low-dose levodopa and needed adjustment in the past for hallucinations. We talked about her fall risk and she was essentially wheelchair dependent at the time. I did not make any changes to her medications with the exception of changing her to long-acting Namenda once daily 14 mg.   She no  longer sees Dr. Vertell Limber in [place]. Memory loss is stable. She has been tolerating the Namenda XR 14 mg. She is on C/L 1 pill twice daily. He could split the pills without crushing. He gives her Ensure as well. Their daughter helps out and their son lives a mile away and the 3 GC come and visit.    I first met her on 04/18/2013, which time I talked to her husband a long time about the complexity of her issues. I was reluctant to increase her levodopa for fear of side effects including exacerbation of her hallucinations.   She previously followed with Dr. Morene Antu and was last seen by Dr. Erling Cruz on 09/20/2012, at which time he started donepezil for dementia and did some blood work. He decreased her Sinemet to one tablet, alternating with half a tablet 4 times a day for a total of 300 mg of levodopa to help with dyskinesias and hallucinations. He did not start her on amantadine because of her dementia. She was also advised to start CPAP for obstructive sleep apnea. She has been on donepezil 5 mg daily, tramadol, meloxicam, multivitamin, stool softener, Detrol LA, pilocarpine, pravastatin, atenolol-chlorthalidone, Klor-Con, Sinemet 1 tablet 8, half a tablet at noon, 1 tablet at 4 PM and half a tablet at bedtime. She had B12, TSH, RPR tested in January 2014, which were normal.   She was diagnosed with left-sided Parkinson's disease in 1999 and started on Sinemet at the time. She started having dyskinesias and started using a rolling walker with a seat and a wheelchair. She has had recurrent falls and freezing spells and festination and did not improve with selegiline which was stopped. She developed constipation and urinary incontinence and does not exercise regularly. She had hallucinations and delusions. She fell and hit her head in May 2012 and was seen in the emergency room. She was found to have a C2 vertebral fracture and CT head without contrast showed generalized atrophy. She had surgery by Dr. Vertell Limber in July 2012  and August 2012 she started having memory loss in 2012. In December 2012 her MMSE was 24, clock drawing was 4, and normal fluency was 9. She does not sleep well at night and takes multiple naps during the day. She was diagnosed with complex sleep apnea in March 2012 without REM onset behavior disorder. She had a repeat sleep study in April 2012 for CPAP titration but then never started CPAP. She had frequent PLMS. She takes meloxicam and tramadol for her residual head and neck pain.   She saw Dr. Brett Fairy in April 2014, and was encouraged to use her CPAP.  Dr. Brett Fairy reviewed her sleep test results and compliance data with the patient and her husband at the time.   Her high cervical fracture was treated with a brace. She has a good support system with daughter living next door. She has hallucinations especially at night. She has not been using her CPAP and it was taken back. She continually pulled off the mask in the middle of the night and the husband would wake up with a leaking air noise. He has tried but has not been able to make her use the CPAP. She used it 19/30 days and mostly for 1 to 2 hours at a time and she still had a high residual AHI. She has been on Namenda and Aricept. She has dyskinesias on the LLE. Of note, she has severe hearing loss and threw away 2 pairs of hearing aids.     Her Past Medical History Is Significant For: Past Medical History:  Diagnosis Date  . Back pain    lower back pain , C2 fracture from fall  . Dementia   . High cholesterol    since 2000  . Hypertension    since 1985  . Obstructive apnea   . Parkinson disease [facility])     Her Past Surgical History Is Significant For: Past Surgical History:  Procedure Laterality Date  . CERVICAL SPINE SURGERY     02/2011, 03/2011  . LUMBAR SPINE SURGERY    . rotary cuff Right   . SHOULDER SURGERY Left    [id number]    Her Family History Is Significant For: Family History  Problem Relation Age of Onset  . Cancer  Mother   . Congestive Heart Failure Father   . Diabetes Other   . Hypertension Other   . Hyperlipidemia Other   . Heart disease Other     Her Social History Is Significant For: Social History   Socioeconomic History  . Marital status: Married    Spouse name: [name]  . Number of children: 2  . Years of education: HS  . Highest education level: None  Social Needs  . Financial resource strain: None  . Food insecurity - worry: None  . Food insecurity - inability: None  . Transportation needs - medical: None  . Transportation needs - non-medical: None  Occupational History    Employer: RETIRED  Tobacco Use  . Smoking status: Never Smoker  . Smokeless tobacco: Never Used  Substance and Sexual Activity  . Alcohol use: No  . Drug use: No  . Sexual activity: None  Other Topics Concern  . None  Social History Narrative   Patient lives at home with spouse.   Caffeine Use: 2 cups daily    Her Allergies Are:  No Known Allergies:   Her Current Medications Are:  Outpatient Encounter Medications as of 10/08/2017  Medication Sig  . atenolol-chlorthalidone (TENORETIC) 50-25 MG per tablet Take 1 tablet by mouth daily.  . carbidopa-levodopa (SINEMET IR) 25-100 MG tablet Take 1 tablet by mouth 2 (two) times daily.  Marland Kitchen donepezil (ARICEPT) 5 MG tablet TAKE 1 TABLET BY MOUTH AT  BEDTIME  . memantine (NAMENDA XR) 14 MG CP24 24 hr capsule TAKE 1 CAPSULE BY MOUTH  DAILY  . polyethylene glycol (MIRALAX / GLYCOLAX) packet Take 17 g by mouth 2 (two) times daily.  . potassium chloride SA (K-DUR,KLOR-CON) 20 MEQ tablet Take 20 mEq by mouth daily.  . pravastatin (PRAVACHOL) 80 MG tablet Take 80 mg by mouth daily.  . traMADol (  ULTRAM) 50 MG tablet Take 1 tablet (50 mg total) by mouth every 6 (six) hours as needed for moderate pain.  . VESICARE 5 MG tablet    No facility-administered encounter medications on file as of 10/08/2017.   :  Review of Systems:  Out of a complete 14 point review of  systems, all are reviewed and negative with the exception of these symptoms as listed below:  Review of Systems  Neurological:       Pt presents today to follow up. Pt is non-verbal today with me, unable to complete MMSE. Pt's husband reports that he is caring for pt at home by himself but he may need some help in the future.    Objective:  Neurological Exam  Physical Exam Physical Examination:   Vitals:   10/08/17 1122  BP: 120/68  Pulse: 88   General Examination: The patient is a very pleasant 84 y.o. female in no acute distress. She is situated in her wheelchair. She is frail and deconditioned appearing, essentially nonverbal. She is well groomed.   HEENT: Normocephalic, in soft brace. Status post bilateral cataract appears. She has more decline in her neck posture. Funduscopic exam is not possible, extraocular tracking is impaired and difficult to assess. Hearing appears to be impaired. Neck mobility is passively tested but she does have active movements in her neck. Airway examination reveals mild mouth dryness, adequate dental hygiene, no oral facial dyskinesias, no drooling, mild to moderate facial masking and mild to moderate decrease in eye blink rate.   Chest: Clear to auscultation without wheezing, rhonchi or crackles noted.  Heart: S1+S2+0, regular and normal without murmurs, rubs or gallops noted.   Abdomen: Soft, non-tender and non-distended with normal bowel sounds appreciated on auscultation.  Extremities: There is no pitting edema in the distal lower extremities bilaterally. Pedal pulses are intact.  Skin: Warm and dry without trophic changes noted.   Musculoskeletal: exam reveals no obvious joint deformities, tenderness or joint swelling or erythema with the exception of more stooped posture, she does keep her hands fisted bilaterally but is able to actively open both hands.   Neurologically:  Mental status: The patient is awake, paying some attention, is  minimally to nonverbal. She is able to follow simple commands and answers essentially no questions. She is not able to participate and MMSE.   Cranial nerves II - XII are as described above under HEENT exam. She has a tendency to lean to the right. She has no dyskinesias. Tone is increased.  Motor exam: Thin bulk with increased tone and global strength of 4- out of 5. Reflexes are 1+ in the upper extremities and trace in the lower extremities. Fine motor skills are moderate to severely impaired globally, no significant lateralization noted. Sensory exam is intact to light touch.  Gait, station and balance: She is unable to stand or walk for me. She has a tendency to lean to the right in her wheelchair.  Assessment and Plan:   In summary, Kathleen Haynes is a very pleasant 84 year old female with an underlying complex medical history of chronic pain, C2 fracture from a fall with residual head and neck pain, abnormal neck posture, hyperlipidemia, history of sleep apnea, hypertension, who presents for follow-up consultation of her left-sided predominant Parkinson's disease and dementia, complicated by multiple factors including advancing age, frailty, deconditioning, severe pain, constipation, hearing loss, history of REM behavior disorder, history of dyskinesias and hallucinations, multiple falls with injuries in the past. She has had  multiple falls with injuries. She is practically wheelchair bound. Unfortunately, there is not a whole lot we can do at this time. Medication options are limited because of age and medication side effects including dyskinesias and hallucinations. She continues to take low-dose Sinemet. Constipation is under reasonable control at this time per husband. He gives her tramadol as needed for pain. We will continue with low-dose donepezil and Namenda long-acting once daily.  I suggested we proceed with a home health referral. He was agreeable to this. I will request home health  social worker evaluation and also evaluation for aid at the house. Would like for her to return in 6 months, she can see Jinny Blossom next time, I answered all his questions today and the patient's husband was in agreement. I spent 20 minutes in total face-to-face time with the patient, more than 50% of which was spent in counseling and coordination of care, reviewing test results, reviewing medication and discussing or reviewing the diagnosis of PD and dementia, its prognosis and treatment options. Pertinent laboratory and imaging test results that were available during this visit with the patient were reviewed by me and considered in my medical decision making (see chart for details).

## 2017-10-08 NOTE — Patient Instructions (Addendum)
We will do a home health referral to see if you can get some help at the house.  Please follow up with Megan in 6 months, we will continue with your 2 medications for memory loss and the Sinemet for the Parkinson's.

## 2017-10-09 ENCOUNTER — Telehealth: Payer: Self-pay | Admitting: Neurology

## 2017-10-09 DIAGNOSIS — F0280 Dementia in other diseases classified elsewhere without behavioral disturbance: Secondary | ICD-10-CM

## 2017-10-09 DIAGNOSIS — R5381 Other malaise: Secondary | ICD-10-CM

## 2017-10-09 DIAGNOSIS — G20 Parkinson's disease: Secondary | ICD-10-CM

## 2017-10-09 DIAGNOSIS — R54 Age-related physical debility: Secondary | ICD-10-CM

## 2017-10-09 NOTE — Addendum Note (Signed)
Addended by: Lester George A on: 10/09/2017 05:27 PM   Modules accepted: Orders

## 2017-10-09 NOTE — Telephone Encounter (Signed)
VO from Dr. Rexene Alberts to add PT. Order placed.

## 2017-10-09 NOTE — Telephone Encounter (Signed)
Because of Patient's insurance Physical Therapy will need to be added to [facility]. Thanks Hinton Dyer.

## 2017-10-10 DIAGNOSIS — G20 Parkinson's disease: Secondary | ICD-10-CM | POA: Diagnosis not present

## 2017-10-10 DIAGNOSIS — M519 Unspecified thoracic, thoracolumbar and lumbosacral intervertebral disc disorder: Secondary | ICD-10-CM | POA: Diagnosis not present

## 2017-10-10 DIAGNOSIS — M47812 Spondylosis without myelopathy or radiculopathy, cervical region: Secondary | ICD-10-CM | POA: Diagnosis not present

## 2017-10-10 DIAGNOSIS — I10 Essential (primary) hypertension: Secondary | ICD-10-CM | POA: Diagnosis not present

## 2017-10-10 NOTE — Telephone Encounter (Signed)
Roselie Awkward physical therapist with [facility] home [facility] calling stating he did not see the pt today. Pt will be for twice a week for 4 weeks then once a week for 1 week. Also that a home health aid will come for once a week for 4 weeks. Any question reach him at [phone]

## 2017-10-11 DIAGNOSIS — G20 Parkinson's disease: Secondary | ICD-10-CM | POA: Diagnosis not present

## 2017-10-11 DIAGNOSIS — M519 Unspecified thoracic, thoracolumbar and lumbosacral intervertebral disc disorder: Secondary | ICD-10-CM | POA: Diagnosis not present

## 2017-10-11 DIAGNOSIS — M47812 Spondylosis without myelopathy or radiculopathy, cervical region: Secondary | ICD-10-CM | POA: Diagnosis not present

## 2017-10-11 DIAGNOSIS — I10 Essential (primary) hypertension: Secondary | ICD-10-CM | POA: Diagnosis not present

## 2017-10-12 DIAGNOSIS — M519 Unspecified thoracic, thoracolumbar and lumbosacral intervertebral disc disorder: Secondary | ICD-10-CM | POA: Diagnosis not present

## 2017-10-12 DIAGNOSIS — I10 Essential (primary) hypertension: Secondary | ICD-10-CM | POA: Diagnosis not present

## 2017-10-12 DIAGNOSIS — G20 Parkinson's disease: Secondary | ICD-10-CM | POA: Diagnosis not present

## 2017-10-12 DIAGNOSIS — M47812 Spondylosis without myelopathy or radiculopathy, cervical region: Secondary | ICD-10-CM | POA: Diagnosis not present

## 2017-10-15 DIAGNOSIS — G20 Parkinson's disease: Secondary | ICD-10-CM | POA: Diagnosis not present

## 2017-10-15 DIAGNOSIS — M519 Unspecified thoracic, thoracolumbar and lumbosacral intervertebral disc disorder: Secondary | ICD-10-CM | POA: Diagnosis not present

## 2017-10-15 DIAGNOSIS — M47812 Spondylosis without myelopathy or radiculopathy, cervical region: Secondary | ICD-10-CM | POA: Diagnosis not present

## 2017-10-15 DIAGNOSIS — I10 Essential (primary) hypertension: Secondary | ICD-10-CM | POA: Diagnosis not present

## 2017-10-18 DIAGNOSIS — I10 Essential (primary) hypertension: Secondary | ICD-10-CM | POA: Diagnosis not present

## 2017-10-18 DIAGNOSIS — G20 Parkinson's disease: Secondary | ICD-10-CM | POA: Diagnosis not present

## 2017-10-18 DIAGNOSIS — M519 Unspecified thoracic, thoracolumbar and lumbosacral intervertebral disc disorder: Secondary | ICD-10-CM | POA: Diagnosis not present

## 2017-10-18 DIAGNOSIS — M47812 Spondylosis without myelopathy or radiculopathy, cervical region: Secondary | ICD-10-CM | POA: Diagnosis not present

## 2017-10-23 DIAGNOSIS — M47812 Spondylosis without myelopathy or radiculopathy, cervical region: Secondary | ICD-10-CM | POA: Diagnosis not present

## 2017-10-23 DIAGNOSIS — M519 Unspecified thoracic, thoracolumbar and lumbosacral intervertebral disc disorder: Secondary | ICD-10-CM | POA: Diagnosis not present

## 2017-10-23 DIAGNOSIS — G20 Parkinson's disease: Secondary | ICD-10-CM | POA: Diagnosis not present

## 2017-10-23 DIAGNOSIS — I10 Essential (primary) hypertension: Secondary | ICD-10-CM | POA: Diagnosis not present

## 2017-10-25 DIAGNOSIS — M519 Unspecified thoracic, thoracolumbar and lumbosacral intervertebral disc disorder: Secondary | ICD-10-CM | POA: Diagnosis not present

## 2017-10-25 DIAGNOSIS — I10 Essential (primary) hypertension: Secondary | ICD-10-CM | POA: Diagnosis not present

## 2017-10-25 DIAGNOSIS — M47812 Spondylosis without myelopathy or radiculopathy, cervical region: Secondary | ICD-10-CM | POA: Diagnosis not present

## 2017-10-25 DIAGNOSIS — G20 Parkinson's disease: Secondary | ICD-10-CM | POA: Diagnosis not present

## 2017-10-30 DIAGNOSIS — G20 Parkinson's disease: Secondary | ICD-10-CM | POA: Diagnosis not present

## 2017-10-30 DIAGNOSIS — M519 Unspecified thoracic, thoracolumbar and lumbosacral intervertebral disc disorder: Secondary | ICD-10-CM | POA: Diagnosis not present

## 2017-10-30 DIAGNOSIS — I10 Essential (primary) hypertension: Secondary | ICD-10-CM | POA: Diagnosis not present

## 2017-10-30 DIAGNOSIS — M47812 Spondylosis without myelopathy or radiculopathy, cervical region: Secondary | ICD-10-CM | POA: Diagnosis not present

## 2017-11-01 DIAGNOSIS — I10 Essential (primary) hypertension: Secondary | ICD-10-CM | POA: Diagnosis not present

## 2017-11-01 DIAGNOSIS — M519 Unspecified thoracic, thoracolumbar and lumbosacral intervertebral disc disorder: Secondary | ICD-10-CM | POA: Diagnosis not present

## 2017-11-01 DIAGNOSIS — G20 Parkinson's disease: Secondary | ICD-10-CM | POA: Diagnosis not present

## 2017-11-01 DIAGNOSIS — M47812 Spondylosis without myelopathy or radiculopathy, cervical region: Secondary | ICD-10-CM | POA: Diagnosis not present

## 2017-11-06 DIAGNOSIS — M519 Unspecified thoracic, thoracolumbar and lumbosacral intervertebral disc disorder: Secondary | ICD-10-CM | POA: Diagnosis not present

## 2017-11-06 DIAGNOSIS — G20 Parkinson's disease: Secondary | ICD-10-CM | POA: Diagnosis not present

## 2017-11-06 DIAGNOSIS — M47812 Spondylosis without myelopathy or radiculopathy, cervical region: Secondary | ICD-10-CM | POA: Diagnosis not present

## 2017-11-06 DIAGNOSIS — I10 Essential (primary) hypertension: Secondary | ICD-10-CM | POA: Diagnosis not present

## 2017-11-13 DIAGNOSIS — M47812 Spondylosis without myelopathy or radiculopathy, cervical region: Secondary | ICD-10-CM | POA: Diagnosis not present

## 2017-11-13 DIAGNOSIS — I10 Essential (primary) hypertension: Secondary | ICD-10-CM | POA: Diagnosis not present

## 2017-11-13 DIAGNOSIS — G20 Parkinson's disease: Secondary | ICD-10-CM | POA: Diagnosis not present

## 2017-11-13 DIAGNOSIS — M519 Unspecified thoracic, thoracolumbar and lumbosacral intervertebral disc disorder: Secondary | ICD-10-CM | POA: Diagnosis not present

## 2017-12-19 DIAGNOSIS — I10 Essential (primary) hypertension: Secondary | ICD-10-CM | POA: Diagnosis not present

## 2017-12-19 DIAGNOSIS — Z0000 Encounter for general adult medical examination without abnormal findings: Secondary | ICD-10-CM | POA: Diagnosis not present

## 2017-12-19 DIAGNOSIS — Z79899 Other long term (current) drug therapy: Secondary | ICD-10-CM | POA: Diagnosis not present

## 2017-12-26 DIAGNOSIS — I10 Essential (primary) hypertension: Secondary | ICD-10-CM | POA: Diagnosis not present

## 2017-12-26 DIAGNOSIS — Z79899 Other long term (current) drug therapy: Secondary | ICD-10-CM | POA: Diagnosis not present

## 2017-12-26 DIAGNOSIS — Z0000 Encounter for general adult medical examination without abnormal findings: Secondary | ICD-10-CM | POA: Diagnosis not present

## 2017-12-26 DIAGNOSIS — E756 Lipid storage disorder, unspecified: Secondary | ICD-10-CM | POA: Diagnosis not present

## 2018-02-22 ENCOUNTER — Other Ambulatory Visit: Payer: Self-pay | Admitting: Neurology

## 2018-02-22 DIAGNOSIS — R413 Other amnesia: Secondary | ICD-10-CM

## 2018-02-22 DIAGNOSIS — K5909 Other constipation: Secondary | ICD-10-CM

## 2018-02-22 DIAGNOSIS — G20 Parkinson's disease: Secondary | ICD-10-CM

## 2018-02-22 DIAGNOSIS — G4752 REM sleep behavior disorder: Secondary | ICD-10-CM

## 2018-02-22 DIAGNOSIS — R54 Age-related physical debility: Secondary | ICD-10-CM

## 2018-02-22 DIAGNOSIS — R443 Hallucinations, unspecified: Secondary | ICD-10-CM

## 2018-02-22 DIAGNOSIS — F0280 Dementia in other diseases classified elsewhere without behavioral disturbance: Secondary | ICD-10-CM

## 2018-04-10 ENCOUNTER — Encounter: Payer: Self-pay | Admitting: Adult Health

## 2018-04-10 ENCOUNTER — Ambulatory Visit: Payer: Medicare Other | Admitting: Adult Health

## 2018-04-10 DIAGNOSIS — F0280 Dementia in other diseases classified elsewhere without behavioral disturbance: Secondary | ICD-10-CM | POA: Diagnosis not present

## 2018-04-10 DIAGNOSIS — G20 Parkinson's disease: Secondary | ICD-10-CM | POA: Diagnosis not present

## 2018-04-10 MED ORDER — CARBIDOPA-LEVODOPA 25-100 MG PO TABS: 1.0000 | ORAL_TABLET | Freq: Two times a day (BID) | ORAL | 3 refills | Status: DC

## 2018-04-10 NOTE — Progress Notes (Addendum)
PATIENT: Kathleen Haynes DOB: 1926/09/22  REASON FOR VISIT: follow up HISTORY FROM: patient  HISTORY OF PRESENT ILLNESS: Today 04/10/18: Kathleen Haynes is an 84 year old female with a history of Parkinson's disease and memory disturbance.  She returns today for follow-up.  Her husband continues to be the primary caregiver.  They did have home health come in but that only lasted for several weeks.  He reports overall the patient has remained stable.  She does not ambulate.  She primarily uses a walker.  He has to assist her with transfers.  He reports that she does have a good appetite and she eats well.  Her sleep varies.  He reports some nights she sleeps better than others.  He states her speech is becoming more slurred and garbled.  He states that she continues to have crying episodes.  She continues on Sinemet, Namenda and Aricept.  She returns today for evaluation.   HISTORY 10/02/2016(Copied From Dr. Guadelupe Sabin note):  She reportsvery little, essentially nonverbal. Brief episode of crying, husband reports that she starts crying sometimes. He has no new issues to report other than it is difficult for him to help her on a day-to-day basis. He does not have any home health agency. His daughter lives next or and helps out. There is some helps out as well. He does not leave her at home alone and has someone with her when he needs to go somewhere. It is difficult for him to stand and transfer. She does seem to complain about neck pain but it is hard to tell for him. Sometimes she starts crying and cannot tell them why. No recent acute illness, no recent falls. Constipation is under decent control.   REVIEW OF SYSTEMS: Out of a complete 14 system review of symptoms, the patient complains only of the following symptoms, and all other reviewed systems are negative.  See HPI  ALLERGIES: No Known Allergies  HOME MEDICATIONS: Outpatient Medications Prior to Visit  Medication Sig Dispense Refill  .  atenolol-chlorthalidone (TENORETIC) 50-25 MG per tablet Take 1 tablet by mouth daily.    . carbidopa-levodopa (SINEMET IR) 25-100 MG tablet TAKE 1 TABLET BY MOUTH TWO  TIMES DAILY 180 tablet 0  . donepezil (ARICEPT) 5 MG tablet TAKE 1 TABLET BY MOUTH AT  BEDTIME 90 tablet 3  . memantine (NAMENDA XR) 14 MG CP24 24 hr capsule TAKE 1 CAPSULE BY MOUTH  DAILY 90 capsule 3  . polyethylene glycol (MIRALAX / GLYCOLAX) packet Take 17 g by mouth 2 (two) times daily. 14 each 0  . potassium chloride SA (K-DUR,KLOR-CON) 20 MEQ tablet Take 20 mEq by mouth daily.    . pravastatin (PRAVACHOL) 80 MG tablet Take 80 mg by mouth daily.    . traMADol (ULTRAM) 50 MG tablet Take 1 tablet (50 mg total) by mouth every 6 (six) hours as needed for moderate pain. 30 tablet 0  . VESICARE 5 MG tablet      No facility-administered medications prior to visit.     PAST MEDICAL HISTORY: Past Medical History:  Diagnosis Date  . Back pain    lower back pain , C2 fracture from fall  . Dementia   . High cholesterol    since 2000  . Hypertension    since 1985  . Obstructive apnea   . Parkinson disease ([place])     PAST SURGICAL HISTORY: Past Surgical History:  Procedure Laterality Date  . CERVICAL SPINE SURGERY     02/2011, 03/2011  .  LUMBAR SPINE SURGERY    . rotary cuff Right   . SHOULDER SURGERY Left    [phone]    FAMILY HISTORY: Family History  Problem Relation Age of Onset  . Cancer Mother   . Congestive Heart Failure Father   . Diabetes Other   . Hypertension Other   . Hyperlipidemia Other   . Heart disease Other     SOCIAL HISTORY: Social History   Socioeconomic History  . Marital status: Married    Spouse name: [name]  . Number of children: 2  . Years of education: HS  . Highest education level: Not on file  Occupational History    Employer: RETIRED  Social Needs  . Financial resource strain: Not on file  . Food insecurity:    Worry: Not on file    Inability: Not on file  .  Transportation needs:    Medical: Not on file    Non-medical: Not on file  Tobacco Use  . Smoking status: Never Smoker  . Smokeless tobacco: Never Used  Substance and Sexual Activity  . Alcohol use: No  . Drug use: No  . Sexual activity: Not on file  Lifestyle  . Physical activity:    Days per week: Not on file    Minutes per session: Not on file  . Stress: Not on file  Relationships  . Social connections:    Talks on phone: Not on file    Gets together: Not on file    Attends religious service: Not on file    Active member of club or organization: Not on file    Attends meetings of clubs or organizations: Not on file    Relationship status: Not on file  . Intimate partner violence:    Fear of current or ex partner: Not on file    Emotionally abused: Not on file    Physically abused: Not on file    Forced sexual activity: Not on file  Other Topics Concern  . Not on file  Social History Narrative   Patient lives at home with spouse.   Caffeine Use: 2 cups daily      PHYSICAL EXAM  Vitals:   There is no height or weight on file to calculate BMI.  Generalized: Well developed, in no acute distress   Neurological examination  Mentation: Alert.. Follows all commands intermittently.  Speech is limited. Cranial nerve II-XII: Pupils were equal round reactive to light. Extraocular movements were full, visual field were full on confrontational test. Facial sensation and strength were normal. Uvula tongue midline. Head turning and shoulder shrug  were normal and symmetric. Motor: The motor testing reveals 4 over 5 strength of all 4 extremities. Sensory: Sensory testing is intact to soft touch on all 4 extremities. No evidence of extinction is noted.  Coordination: Cerebellar testing reveals good finger-nose-finge, difficulty with heel-to-shin bilaterally.  Gait and station: Patient is in a wheelchair.  DIAGNOSTIC DATA (LABS, IMAGING, TESTING) - I reviewed patient records,  labs, notes, testing and imaging myself where available.  Lab Results  Component Value Date   WBC 5.9 12/02/2015   HGB 12.3 12/02/2015   HCT 36.5 12/02/2015   MCV 88.2 12/02/2015   PLT 294 12/02/2015      Component Value Date/Time   NA 139 12/02/2015 0621   K 3.6 12/02/2015 0621   CL 98 (L) 12/02/2015 0621   CO2 24 12/02/2015 0621   GLUCOSE 57 (L) 12/02/2015 0621   BUN 21 (H) 12/02/2015 4599  CREATININE 1.16 (H) 12/02/2015 0621   CALCIUM 9.6 12/02/2015 0621   PROT 6.8 12/02/2015 0621   ALBUMIN 3.1 (L) 12/02/2015 0621   AST 29 12/02/2015 0621   ALT 8 (L) 12/02/2015 0621   ALKPHOS 211 (H) 12/02/2015 0621   BILITOT 0.9 12/02/2015 0621   GFRNONAA 41 (L) 12/02/2015 0621   GFRAA 48 (L) 12/02/2015 4270     ASSESSMENT AND PLAN 84 y.o. year old female  has a past medical history of Back pain, Dementia, High cholesterol, Hypertension, Obstructive apnea, and Parkinson disease ([place]). here with :  1.  Parkinson's disease 2.  Memory disturbance  Overall the patient has remained stable.  She will continue on Sinemet 25-100 mg twice a day.  The patient will remain on Aricept and Namenda.  I have advised the patient that if her symptoms worsen or she develops new symptoms he should let us know.  She will follow-up in 6 months or sooner if needed.  I did give the husband the contact information for care patrol to hopefully find assistance for her care in the home.     Ward Givens, MSN, NP-C 04/10/2018, 1:10 PM [facility] [address], [place], [place] [address] [phone]  I reviewed the above note and documentation by the Nurse Practitioner and agree with the history, physical exam, assessment and plan as outlined above. I was immediately available for face-to-face consultation. Star Age, MD, PhD [facility])

## 2018-04-10 NOTE — Patient Instructions (Signed)
Your Plan:  Continue Sinemet and Aricept Memory score is stable If your symptoms worsen or you develop new symptoms please let us know.    Thank you for coming to see [place] at [facility]. I hope we have been able to provide you high quality care today.  You may receive a patient satisfaction survey over the next few weeks. We would appreciate your feedback and comments so that we may continue to improve ourselves and the health of our patients.

## 2018-06-07 DIAGNOSIS — Z23 Encounter for immunization: Secondary | ICD-10-CM | POA: Diagnosis not present

## 2018-07-01 DIAGNOSIS — Z79899 Other long term (current) drug therapy: Secondary | ICD-10-CM | POA: Diagnosis not present

## 2018-07-01 DIAGNOSIS — I10 Essential (primary) hypertension: Secondary | ICD-10-CM | POA: Diagnosis not present

## 2018-07-01 DIAGNOSIS — Z0000 Encounter for general adult medical examination without abnormal findings: Secondary | ICD-10-CM | POA: Diagnosis not present

## 2018-07-10 ENCOUNTER — Emergency Department (HOSPITAL_COMMUNITY): Payer: Medicare Other

## 2018-07-10 ENCOUNTER — Inpatient Hospital Stay (HOSPITAL_COMMUNITY)
Admission: EM | Admit: 2018-07-10 | Discharge: 2018-07-15 | DRG: 064 | Disposition: A | Discharge status: Hospice | Payer: Medicare Other | Attending: Internal Medicine | Admitting: Internal Medicine

## 2018-07-10 ENCOUNTER — Other Ambulatory Visit: Payer: Self-pay

## 2018-07-10 ENCOUNTER — Encounter (HOSPITAL_COMMUNITY): Payer: Self-pay | Admitting: Emergency Medicine

## 2018-07-10 DIAGNOSIS — Z7401 Bed confinement status: Secondary | ICD-10-CM | POA: Diagnosis not present

## 2018-07-10 DIAGNOSIS — Z66 Do not resuscitate: Secondary | ICD-10-CM | POA: Diagnosis not present

## 2018-07-10 DIAGNOSIS — R410 Disorientation, unspecified: Secondary | ICD-10-CM | POA: Diagnosis not present

## 2018-07-10 DIAGNOSIS — R4020 Unspecified coma: Secondary | ICD-10-CM | POA: Diagnosis not present

## 2018-07-10 DIAGNOSIS — Z515 Encounter for palliative care: Secondary | ICD-10-CM | POA: Diagnosis not present

## 2018-07-10 DIAGNOSIS — R296 Repeated falls: Secondary | ICD-10-CM | POA: Diagnosis not present

## 2018-07-10 DIAGNOSIS — R400 Somnolence: Secondary | ICD-10-CM

## 2018-07-10 DIAGNOSIS — G8191 Hemiplegia, unspecified affecting right dominant side: Secondary | ICD-10-CM | POA: Diagnosis not present

## 2018-07-10 DIAGNOSIS — F0280 Dementia in other diseases classified elsewhere without behavioral disturbance: Secondary | ICD-10-CM | POA: Diagnosis present

## 2018-07-10 DIAGNOSIS — E43 Unspecified severe protein-calorie malnutrition: Secondary | ICD-10-CM | POA: Diagnosis present

## 2018-07-10 DIAGNOSIS — R531 Weakness: Secondary | ICD-10-CM

## 2018-07-10 DIAGNOSIS — G9340 Encephalopathy, unspecified: Secondary | ICD-10-CM

## 2018-07-10 DIAGNOSIS — Z993 Dependence on wheelchair: Secondary | ICD-10-CM

## 2018-07-10 DIAGNOSIS — Z7189 Other specified counseling: Secondary | ICD-10-CM | POA: Diagnosis not present

## 2018-07-10 DIAGNOSIS — H9190 Unspecified hearing loss, unspecified ear: Secondary | ICD-10-CM | POA: Diagnosis not present

## 2018-07-10 DIAGNOSIS — M8448XA Pathological fracture, other site, initial encounter for fracture: Secondary | ICD-10-CM | POA: Diagnosis not present

## 2018-07-10 DIAGNOSIS — R29810 Facial weakness: Secondary | ICD-10-CM | POA: Diagnosis not present

## 2018-07-10 DIAGNOSIS — I351 Nonrheumatic aortic (valve) insufficiency: Secondary | ICD-10-CM | POA: Diagnosis not present

## 2018-07-10 DIAGNOSIS — N3000 Acute cystitis without hematuria: Secondary | ICD-10-CM | POA: Diagnosis present

## 2018-07-10 DIAGNOSIS — Z681 Body mass index (BMI) 19 or less, adult: Secondary | ICD-10-CM

## 2018-07-10 DIAGNOSIS — E785 Hyperlipidemia, unspecified: Secondary | ICD-10-CM | POA: Diagnosis not present

## 2018-07-10 DIAGNOSIS — R404 Transient alteration of awareness: Secondary | ICD-10-CM | POA: Diagnosis not present

## 2018-07-10 DIAGNOSIS — M2550 Pain in unspecified joint: Secondary | ICD-10-CM | POA: Diagnosis not present

## 2018-07-10 DIAGNOSIS — G9341 Metabolic encephalopathy: Secondary | ICD-10-CM | POA: Diagnosis present

## 2018-07-10 DIAGNOSIS — I639 Cerebral infarction, unspecified: Secondary | ICD-10-CM | POA: Diagnosis not present

## 2018-07-10 DIAGNOSIS — I10 Essential (primary) hypertension: Secondary | ICD-10-CM | POA: Diagnosis present

## 2018-07-10 DIAGNOSIS — N390 Urinary tract infection, site not specified: Secondary | ICD-10-CM | POA: Diagnosis not present

## 2018-07-10 DIAGNOSIS — Z79899 Other long term (current) drug therapy: Secondary | ICD-10-CM

## 2018-07-10 DIAGNOSIS — G20 Parkinson's disease: Secondary | ICD-10-CM | POA: Diagnosis present

## 2018-07-10 DIAGNOSIS — G20A1 Parkinson's disease without dyskinesia, without mention of fluctuations: Secondary | ICD-10-CM | POA: Diagnosis present

## 2018-07-10 DIAGNOSIS — E162 Hypoglycemia, unspecified: Secondary | ICD-10-CM | POA: Diagnosis not present

## 2018-07-10 DIAGNOSIS — I6521 Occlusion and stenosis of right carotid artery: Secondary | ICD-10-CM | POA: Diagnosis not present

## 2018-07-10 DIAGNOSIS — R29818 Other symptoms and signs involving the nervous system: Secondary | ICD-10-CM | POA: Diagnosis not present

## 2018-07-10 DIAGNOSIS — Z743 Need for continuous supervision: Secondary | ICD-10-CM | POA: Diagnosis not present

## 2018-07-10 DIAGNOSIS — R5381 Other malaise: Secondary | ICD-10-CM | POA: Diagnosis not present

## 2018-07-10 DIAGNOSIS — R52 Pain, unspecified: Secondary | ICD-10-CM | POA: Diagnosis not present

## 2018-07-10 DIAGNOSIS — R29717 NIHSS score 17: Secondary | ICD-10-CM | POA: Diagnosis not present

## 2018-07-10 LAB — I-STAT CHEM 8, ED
BUN: 25 mg/dL — ABNORMAL HIGH (ref 8–23)
CALCIUM ION: 1.12 mmol/L — AB (ref 1.15–1.40)
CHLORIDE: 107 mmol/L (ref 98–111)
Creatinine, Ser: 1 mg/dL (ref 0.44–1.00)
GLUCOSE: 127 mg/dL — AB (ref 70–99)
HCT: 42 % (ref 36.0–46.0)
Hemoglobin: 14.3 g/dL (ref 12.0–15.0)
Potassium: 3.9 mmol/L (ref 3.5–5.1)
SODIUM: 143 mmol/L (ref 135–145)
TCO2: 25 mmol/L (ref 22–32)

## 2018-07-10 LAB — URINALYSIS, ROUTINE W REFLEX MICROSCOPIC
Bilirubin Urine: NEGATIVE
GLUCOSE, UA: NEGATIVE mg/dL
Ketones, ur: NEGATIVE mg/dL
NITRITE: NEGATIVE
PH: 7 (ref 5.0–8.0)
Protein, ur: 30 mg/dL — AB
SPECIFIC GRAVITY, URINE: 1.017 (ref 1.005–1.030)

## 2018-07-10 LAB — COMPREHENSIVE METABOLIC PANEL
ALBUMIN: 3.9 g/dL (ref 3.5–5.0)
ALK PHOS: 72 U/L (ref 38–126)
ALT: 17 U/L (ref 0–44)
AST: 43 U/L — ABNORMAL HIGH (ref 15–41)
Anion gap: 12 (ref 5–15)
BILIRUBIN TOTAL: 0.8 mg/dL (ref 0.3–1.2)
BUN: 23 mg/dL (ref 8–23)
CALCIUM: 9.6 mg/dL (ref 8.9–10.3)
CO2: 22 mmol/L (ref 22–32)
CREATININE: 1.09 mg/dL — AB (ref 0.44–1.00)
Chloride: 108 mmol/L (ref 98–111)
GFR calc Af Amer: 51 mL/min — ABNORMAL LOW (ref >60)
GFR calc non Af Amer: 44 mL/min — ABNORMAL LOW (ref >60)
GLUCOSE: 130 mg/dL — AB (ref 70–99)
Potassium: 3.8 mmol/L (ref 3.5–5.1)
SODIUM: 142 mmol/L (ref 135–145)
Total Protein: 7.3 g/dL (ref 6.5–8.1)

## 2018-07-10 LAB — CBC
HEMATOCRIT: 44.5 % (ref 36.0–46.0)
HEMOGLOBIN: 14.2 g/dL (ref 12.0–15.0)
MCH: 30.1 pg (ref 26.0–34.0)
MCHC: 31.9 g/dL (ref 30.0–36.0)
MCV: 94.5 fL (ref 80.0–100.0)
NRBC: 0 % (ref 0.0–0.2)
Platelets: 241 10*3/uL (ref 150–400)
RBC: 4.71 MIL/uL (ref 3.87–5.11)
RDW: 13.2 % (ref 11.5–15.5)
WBC: 7.1 10*3/uL (ref 4.0–10.5)

## 2018-07-10 LAB — DIFFERENTIAL
Abs Immature Granulocytes: 0.03 10*3/uL (ref 0.00–0.07)
BASOS ABS: 0.1 10*3/uL (ref 0.0–0.1)
Basophils Relative: 1 %
Eosinophils Absolute: 0.2 10*3/uL (ref 0.0–0.5)
Eosinophils Relative: 3 %
IMMATURE GRANULOCYTES: 0 %
LYMPHS ABS: 2.9 10*3/uL (ref 0.7–4.0)
LYMPHS PCT: 41 %
Monocytes Absolute: 0.7 10*3/uL (ref 0.1–1.0)
Monocytes Relative: 10 %
NEUTROS ABS: 3.2 10*3/uL (ref 1.7–7.7)
Neutrophils Relative %: 45 %

## 2018-07-10 LAB — CBG MONITORING, ED: Glucose-Capillary: 80 mg/dL (ref 70–99)

## 2018-07-10 LAB — PROTIME-INR
INR: 1.02
PROTHROMBIN TIME: 13.3 s (ref 11.4–15.2)

## 2018-07-10 LAB — RAPID URINE DRUG SCREEN, HOSP PERFORMED
AMPHETAMINES: NOT DETECTED
BARBITURATES: NOT DETECTED
Benzodiazepines: NOT DETECTED
Cocaine: NOT DETECTED
Opiates: NOT DETECTED
Tetrahydrocannabinol: NOT DETECTED

## 2018-07-10 LAB — I-STAT TROPONIN, ED: Troponin i, poc: 0.01 ng/mL (ref 0.00–0.08)

## 2018-07-10 LAB — ETHANOL

## 2018-07-10 LAB — APTT: aPTT: 29 seconds (ref 24–36)

## 2018-07-10 MED ORDER — POLYETHYLENE GLYCOL 3350 17 G PO PACK: 17.0000 g | PACK | ORAL | Status: DC

## 2018-07-10 MED ORDER — SODIUM CHLORIDE 0.9 % IV SOLN
2.0000 g | Freq: Once | INTRAVENOUS | Status: AC
  Administered 2018-07-10: 2 g via INTRAVENOUS
  Filled 2018-07-10: qty 20

## 2018-07-10 MED ORDER — SODIUM CHLORIDE 0.9 % IV SOLN
1.0000 g | INTRAVENOUS | Status: DC
  Administered 2018-07-11 – 2018-07-14 (×4): 1 g via INTRAVENOUS
  Filled 2018-07-10 (×5): qty 10

## 2018-07-10 MED ORDER — ACETAMINOPHEN 160 MG/5ML PO SOLN: 650.0000 mg | ORAL | Status: DC | PRN

## 2018-07-10 MED ORDER — ASPIRIN 81 MG PO CHEW
81.0000 mg | CHEWABLE_TABLET | Freq: Every day | ORAL | Status: DC
  Administered 2018-07-12 – 2018-07-13 (×2): 81 mg via ORAL
  Filled 2018-07-10 (×3): qty 1

## 2018-07-10 MED ORDER — ACETAMINOPHEN 325 MG PO TABS
650.0000 mg | ORAL_TABLET | ORAL | Status: DC | PRN
  Administered 2018-07-14: 650 mg via ORAL
  Filled 2018-07-10: qty 2

## 2018-07-10 MED ORDER — SENNOSIDES-DOCUSATE SODIUM 8.6-50 MG PO TABS: 1.0000 | ORAL_TABLET | Freq: Every evening | ORAL | Status: DC | PRN

## 2018-07-10 MED ORDER — ACETAMINOPHEN 650 MG RE SUPP
650.0000 mg | RECTAL | Status: DC | PRN
  Administered 2018-07-15: 650 mg via RECTAL
  Filled 2018-07-10: qty 1

## 2018-07-10 MED ORDER — CARBIDOPA-LEVODOPA 25-100 MG PO TABS
1.0000 | ORAL_TABLET | Freq: Two times a day (BID) | ORAL | Status: DC
  Administered 2018-07-12 – 2018-07-15 (×6): 1 via ORAL
  Filled 2018-07-10 (×9): qty 1

## 2018-07-10 MED ORDER — SODIUM CHLORIDE 0.9 % IV SOLN
INTRAVENOUS | Status: DC
  Administered 2018-07-10 – 2018-07-13 (×5): via INTRAVENOUS

## 2018-07-10 MED ORDER — PRAVASTATIN SODIUM 40 MG PO TABS
80.0000 mg | ORAL_TABLET | ORAL | Status: DC
  Administered 2018-07-12 – 2018-07-15 (×3): 80 mg via ORAL
  Filled 2018-07-10 (×4): qty 2

## 2018-07-10 MED ORDER — MEMANTINE HCL ER 14 MG PO CP24
14.0000 mg | ORAL_CAPSULE | ORAL | Status: DC
  Administered 2018-07-12 – 2018-07-15 (×3): 14 mg via ORAL
  Filled 2018-07-10 (×6): qty 1

## 2018-07-10 MED ORDER — POTASSIUM CHLORIDE CRYS ER 20 MEQ PO TBCR
20.0000 meq | EXTENDED_RELEASE_TABLET | Freq: Every day | ORAL | Status: DC
  Administered 2018-07-12: 20 meq via ORAL
  Filled 2018-07-10 (×2): qty 1

## 2018-07-10 MED ORDER — STROKE: EARLY STAGES OF RECOVERY BOOK
Freq: Once | Status: DC
  Filled 2018-07-10 (×2): qty 1

## 2018-07-10 MED ORDER — DONEPEZIL HCL 5 MG PO TABS
5.0000 mg | ORAL_TABLET | Freq: Every day | ORAL | Status: DC
  Administered 2018-07-12 – 2018-07-14 (×3): 5 mg via ORAL
  Filled 2018-07-10 (×3): qty 1

## 2018-07-10 MED ORDER — SODIUM CHLORIDE 0.9 % IV SOLN: Freq: Once | INTRAVENOUS | Status: DC

## 2018-07-10 MED ORDER — DARIFENACIN HYDROBROMIDE ER 7.5 MG PO TB24
7.5000 mg | ORAL_TABLET | Freq: Every day | ORAL | Status: DC
  Administered 2018-07-12 – 2018-07-13 (×2): 7.5 mg via ORAL
  Filled 2018-07-10 (×5): qty 1

## 2018-07-10 MED ORDER — ENOXAPARIN SODIUM 30 MG/0.3ML ~~LOC~~ SOLN
30.0000 mg | SUBCUTANEOUS | Status: DC
  Administered 2018-07-11 – 2018-07-14 (×4): 30 mg via SUBCUTANEOUS
  Filled 2018-07-10 (×4): qty 0.3

## 2018-07-10 NOTE — Code Documentation (Signed)
84 yo Caucasian female coming from home where she lives with her husband. Hx of Parkinson's, dementia, and neck fracture. Family reports pt went to bed at baseline. Woke up this morning with right side weakness and leaning to the right. Pt does not speak at baseline, but is able to feed herself. EMS was called and activated a Code Stroke. Stroke Team met patient upon arrival. Initial NIHSS 17 due to right facial droop, inability to speak or follow commands, right arm flaccid, right leg decreased strength, left leg weak per baseline. CT Head negative for hemorrhage. Not a tPA candidate due to being outside the window. Not an IR candidate due to mRS 5 at baseline. Handoff given to [place], [place].

## 2018-07-10 NOTE — ED Provider Notes (Signed)
[place] EMERGENCY DEPARTMENT Provider Note   CSN: [id number] Arrival date & time: 07/10/18  1026   An emergency department physician performed an initial assessment on this suspected stroke patient at 1028.  History   Chief Complaint Chief Complaint  Patient presents with  . Code Stroke    HPI Kathleen Haynes is a 84 y.o. female.  HPI 84 year old female here with altered mental status.  Patient has history of Parkinson's and dementia.  However, she is normally mostly interactive and does not appear to be in distress.  However, patient awoke this morning and was minimally responsive.  She was also crying due to pain.  EMS was called.  Per EMS report, patient initially had a gaze preference and was not moving one side of her body.  She is moving all sides on arrival.  She was initially activated as a code stroke, with a negative CT head.  She was deemed not a candidate for any kind of intervention.  Patient has been persistently confused and drowsy per family, but has not had any recurrence of her crying or apparent pain.  Level 5 caveat invoked as remainder of history, ROS, and physical exam limited due to patient's AMS.   Past Medical History:  Diagnosis Date  . Back pain    lower back pain , C2 fracture from fall  . Dementia ([place])   . High cholesterol    since 2000  . Hypertension    since 1985  . Obstructive apnea   . Parkinson disease [facility])     Patient Active Problem List   Diagnosis Date Noted  . Weakness of right side of body 07/10/2018  . Essential hypertension 12/01/2015  . Hyperlipidemia 12/01/2015  . Hip fracture ([place]) 11/30/2015  . Closed right hip fracture ([place]) 11/30/2015  . Fall 10/22/2014  . Traumatic pneumothorax 10/22/2014  . Protein-calorie malnutrition, severe ([place]) 10/17/2014  . Multiple fractures of ribs of left side 10/15/2014  . Parkinson's disease ([place]) 11/27/2012  . Unspecified sleep apnea 11/27/2012  . RBD (REM  behavioral disorder) 11/27/2012  . Dementia due to Parkinson's disease without behavioral disturbance ([place]) 11/27/2012  . Hypersomnia with sleep apnea, unspecified 11/27/2012    Past Surgical History:  Procedure Laterality Date  . CERVICAL SPINE SURGERY     02/2011, 03/2011  . LUMBAR SPINE SURGERY    . rotary cuff Right   . SHOULDER SURGERY Left    1999,2001     OB History   None      Home Medications    Prior to Admission medications   Medication Sig Start Date End Date Taking? Authorizing Provider  atenolol-chlorthalidone (TENORETIC) 50-25 MG per tablet Take 1 tablet by mouth every morning.    Yes [provider]  carbidopa-levodopa (SINEMET IR) 25-100 MG tablet Take 1 tablet by mouth 2 (two) times daily. 04/10/18  Yes [place], Jinny Blossom, NP  donepezil (ARICEPT) 5 MG tablet TAKE 1 TABLET BY MOUTH AT  BEDTIME Patient taking differently: Take 5 mg by mouth at bedtime.  09/17/17  Yes Star Age, MD  memantine (NAMENDA XR) 14 MG CP24 24 hr capsule TAKE 1 CAPSULE BY MOUTH  DAILY Patient taking differently: Take 14 mg by mouth every morning.  09/10/17  Yes Star Age, MD  polyethylene glycol (MIRALAX / GLYCOLAX) packet Take 17 g by mouth 2 (two) times daily. Patient taking differently: Take 17 g by mouth 2 (two) times a week.  12/02/15  Yes Thurnell Lose, MD  potassium chloride SA (K-DUR,KLOR-CON) 20 MEQ tablet Take 20 mEq by mouth daily.   Yes [provider]  pravastatin (PRAVACHOL) 80 MG tablet Take 80 mg by mouth every morning.    Yes [provider]  VESICARE 5 MG tablet Take 5 mg by mouth every morning.  04/02/15  Yes [provider]  traMADol (ULTRAM) 50 MG tablet Take 1 tablet (50 mg total) by mouth every 6 (six) hours as needed for moderate pain. Patient not taking: Reported on 07/10/2018 12/02/15   Thurnell Lose, MD    Family History Family History  Problem Relation Age of Onset  . Cancer Mother   . Congestive Heart Failure Father    . Diabetes Other   . Hypertension Other   . Hyperlipidemia Other   . Heart disease Other     Social History Social History   Tobacco Use  . Smoking status: Never Smoker  . Smokeless tobacco: Never Used  Substance Use Topics  . Alcohol use: No  . Drug use: No     Allergies   Patient has no known allergies.   Review of Systems Review of Systems  Unable to perform ROS: Dementia     Physical Exam Updated Vital Signs BP (!) 122/56   Pulse 90   Temp 97.7 F (36.5 C) (Rectal)   Resp 17   Ht 5\' 2"  (1.575 m)   Wt 48 kg   SpO2 97%   BMI 19.35 kg/m   Physical Exam  Constitutional: She appears well-developed and well-nourished. No distress.  HENT:  Head: Normocephalic and atraumatic.  Eyes: Conjunctivae are normal.  Neck: Neck supple.  Cardiovascular: Normal rate, regular rhythm and normal heart sounds. Exam reveals no friction rub.  No murmur heard. Pulmonary/Chest: Effort normal and breath sounds normal. No respiratory distress. She has no wheezes. She has no rales.  Abdominal: She exhibits no distension.  Musculoskeletal: She exhibits no edema.  Neurological:  Drowsy, responds to painful stimuli b/l UE and LE. Face is symmetric. Non-verbal.  Skin: Skin is warm. Capillary refill takes less than 2 seconds.  Psychiatric: She has a normal mood and affect.  Nursing note and vitals reviewed.    ED Treatments / Results  Labs (all labs ordered are listed, but only abnormal results are displayed) Labs Reviewed  COMPREHENSIVE METABOLIC PANEL - Abnormal; Notable for the following components:      Result Value   Glucose, Bld 130 (*)    Creatinine, Ser 1.09 (*)    AST 43 (*)    GFR calc non Af Amer 44 (*)    GFR calc Af Amer 51 (*)    All other components within normal limits  URINALYSIS, ROUTINE W REFLEX MICROSCOPIC - Abnormal; Notable for the following components:   APPearance CLOUDY (*)    Hgb urine dipstick SMALL (*)    Protein, ur 30 (*)    Leukocytes, UA  MODERATE (*)    Bacteria, UA FEW (*)    All other components within normal limits  I-STAT CHEM 8, ED - Abnormal; Notable for the following components:   BUN 25 (*)    Glucose, Bld 127 (*)    Calcium, Ion 1.12 (*)    All other components within normal limits  URINE CULTURE  ETHANOL  PROTIME-INR  APTT  CBC  DIFFERENTIAL  RAPID URINE DRUG SCREEN, HOSP PERFORMED  I-STAT TROPONIN, ED  CBG MONITORING, ED    EKG EKG Interpretation  Date/Time:  Wednesday July 10 2018 10:50:36  EST Ventricular Rate:  82 PR Interval:    QRS Duration: 72 QT Interval:  484 QTC Calculation: 566 R Axis:   -6 Text Interpretation:  Sinus rhythm Abnormal R-wave progression, early transition Nonspecific T abnrm, anterolateral leads Prolonged QT interval Since last EKG, no significant change Confirmed by Duffy Bruce [phone]) on 07/10/2018 11:44:38 AM   Radiology Dg Chest 2 View  Result Date: 07/10/2018 CLINICAL DATA:  Pt arrives to ED from home with complaints of right sided weakness, R sided facial droop, and being mute since 1900 11/13. EMS reports that patient has history of parkinson's and dementia EXAM: CHEST - 2 VIEW COMPARISON:  11/30/2015 FINDINGS: Cardiac silhouette is mildly enlarged. No mediastinal or hilar masses. No convincing adenopathy. Lungs are clear.  No pleural effusion or pneumothorax. Skeletal structures are demineralized. There are multiple old healed rib fractures. IMPRESSION: No acute cardiopulmonary disease. Electronically Signed   By: Lajean Manes M.D.   On: 07/10/2018 13:18   Ct Head Code Stroke Wo Contrast  Result Date: 07/10/2018 CLINICAL DATA:  Code stroke. Right-sided weakness. Altered level of consciousness. EXAM: CT HEAD WITHOUT CONTRAST TECHNIQUE: Contiguous axial images were obtained from the base of the skull through the vertex without intravenous contrast. COMPARISON:  CT head 11/30/2015 FINDINGS: Brain: Moderate atrophy. Chronic encephalomalacia inferior frontal lobe  bilaterally is unchanged. Patchy white matter hypodensity unchanged. Chronic infarct in the right cerebellum unchanged. Negative for acute infarct, hemorrhage, or mass. Vascular: Negative for hyperdense vessel Skull: No focal skull lesion. Chronic fracture of the dens. Progressive erosion of the tip of the dens with progressive basilar invagination. Progressive stenosis at the foramen magnum. Sinuses/Orbits: Paranasal sinuses clear. Bilateral cataract surgery. Other: None ASPECTS ([place] Stroke Program Early CT Score) - Ganglionic level infarction (caudate, lentiform nuclei, internal capsule, insula, M1-M3 cortex): 7 - Supraganglionic infarction (M4-M6 cortex): 3 Total score (0-10 with 10 being normal): 10 IMPRESSION: 1. No acute intracranial abnormality 2. Atrophy and chronic ischemic changes are stable from the prior CT. 3. Chronic fracture of the dens with progressive erosion of the dens and progressive basilar invagination. Moderate stenosis at the foramen magnum. 4. ASPECTS is 10 Electronically Signed   By: Franchot Gallo M.D.   On: 07/10/2018 10:47    Procedures Procedures (including critical care time)  Medications Ordered in ED Medications  0.9 %  sodium chloride infusion (has no administration in time range)  cefTRIAXone (ROCEPHIN) 2 g in sodium chloride 0.9 % 100 mL IVPB (2 g Intravenous New Bag/Given 07/10/18 1402)     Initial Impression / Assessment and Plan / ED Course  I have reviewed the triage vital signs and the nursing notes.  Pertinent labs & imaging results that were available during my care of the patient were reviewed by me and considered in my medical decision making (see chart for details).     84 year old female here with initially code stroke, although her presentation is most consistent with likely encephalopathy.  Patient does have history of UTIs with pyuria on cath UA sample.  Suspect encephalopathy due to UTI.  Neurology initially recommended MRI, but per family,  patient cannot obtain MRI due to metal implants in her ears.  This was not visualized on CT, but given positive UTI, feels reasonable to treat and monitor improvement in mental status.  Final Clinical Impressions(s) / ED Diagnoses   Final diagnoses:  Encephalopathy    ED Discharge Orders    None       Duffy Bruce, MD 07/10/18 1433

## 2018-07-10 NOTE — H&P (Signed)
History and Physical    Kathleen Haynes:[id number] DOB: [birth year] DOA: 07/10/2018  PCP: [name], MD Consultants:  Neurology Patient coming from: Home- lives with husband who is her primary caregiver  Chief Complaint: Weakness  HPI: Kathleen Haynes is a 84 y.o. female with medical history significant for Parkinson's disease, dementia, hypertension, hyperlipidemia, protein calorie malnutrition, who was brought to the ED today by EMS after her husband became concerned that patient was acting like she was in more pain than usual, moaning and crying more, and also noticed that she seemed to be exhibiting right sided upper extremity weakness and was having trouble sitting upright (per son leans to the left chronically and this was no different today except that she seemed to be leaning more drastically). She has chronic neck stiffness but she seemed to be acting like she was in more pain than usual and it didn't seem to be caused by her neck. She eats and drinks normally at baseline. She is often nonverbal but family states that on her good days she is able to speak coherently enough to be understood by them. She is not ambulatory but is either bedridden or sitting in her recliner.  Currently patient is unable to voice any complaints.  ED Course: Afebrile, heart rate 82-90, respiratory rate 14-25, BP 110/84-120 7/80, O2 sats 93 to 100% on room air.  Head CT showed nothing acute.  She did not meet criteria for TPA or interventional radiology.  Urinalysis was suggestive of possible UTI.  Review of Systems: As per HPI; otherwise review of systems reviewed and negative.   Ambulatory Status: Bedridden  Past Medical History:  Diagnosis Date  . Back pain    lower back pain , C2 fracture from fall  . Dementia ([place])   . High cholesterol    since 2000  . Hypertension    since 1985  . Obstructive apnea   . Parkinson disease [facility])     Past Surgical History:  Procedure Laterality Date  .  CERVICAL SPINE SURGERY     02/2011, 03/2011  . LUMBAR SPINE SURGERY    . rotary cuff Right   . SHOULDER SURGERY Left    [id number]    Social History   Socioeconomic History  . Marital status: Married    Spouse name: [name]  . Number of children: 2  . Years of education: HS  . Highest education level: Not on file  Occupational History    Employer: RETIRED  Social Needs  . Financial resource strain: Not on file  . Food insecurity:    Worry: Not on file    Inability: Not on file  . Transportation needs:    Medical: Not on file    Non-medical: Not on file  Tobacco Use  . Smoking status: Never Smoker  . Smokeless tobacco: Never Used  Substance and Sexual Activity  . Alcohol use: No  . Drug use: No  . Sexual activity: Not on file  Lifestyle  . Physical activity:    Days per week: Not on file    Minutes per session: Not on file  . Stress: Not on file  Relationships  . Social connections:    Talks on phone: Not on file    Gets together: Not on file    Attends religious service: Not on file    Active member of club or organization: Not on file    Attends meetings of clubs or organizations: Not on file    Relationship  status: Not on file  . Intimate partner violence:    Fear of current or ex partner: Not on file    Emotionally abused: Not on file    Physically abused: Not on file    Forced sexual activity: Not on file  Other Topics Concern  . Not on file  Social History Narrative   Patient lives at home with spouse.   Caffeine Use: 2 cups daily    No Known Allergies  Family History  Problem Relation Age of Onset  . Cancer Mother   . Congestive Heart Failure Father   . Diabetes Other   . Hypertension Other   . Hyperlipidemia Other   . Heart disease Other     Prior to Admission medications   Medication Sig Start Date End Date Taking? Authorizing Provider  atenolol-chlorthalidone (TENORETIC) 50-25 MG per tablet Take 1 tablet by mouth every morning.    Yes  [provider]  carbidopa-levodopa (SINEMET IR) 25-100 MG tablet Take 1 tablet by mouth 2 (two) times daily. 04/10/18  Yes [place], Jinny Blossom, NP  donepezil (ARICEPT) 5 MG tablet TAKE 1 TABLET BY MOUTH AT  BEDTIME Patient taking differently: Take 5 mg by mouth at bedtime.  09/17/17  Yes Star Age, MD  memantine (NAMENDA XR) 14 MG CP24 24 hr capsule TAKE 1 CAPSULE BY MOUTH  DAILY Patient taking differently: Take 14 mg by mouth every morning.  09/10/17  Yes Star Age, MD  polyethylene glycol (MIRALAX / GLYCOLAX) packet Take 17 g by mouth 2 (two) times daily. Patient taking differently: Take 17 g by mouth 2 (two) times a week.  12/02/15  Yes Thurnell Lose, MD  potassium chloride SA (K-DUR,KLOR-CON) 20 MEQ tablet Take 20 mEq by mouth daily.   Yes [provider]  pravastatin (PRAVACHOL) 80 MG tablet Take 80 mg by mouth every morning.    Yes [provider]  VESICARE 5 MG tablet Take 5 mg by mouth every morning.  04/02/15  Yes [provider]  traMADol (ULTRAM) 50 MG tablet Take 1 tablet (50 mg total) by mouth every 6 (six) hours as needed for moderate pain. Patient not taking: Reported on 07/10/2018 12/02/15   Thurnell Lose, MD    Physical Exam: Vitals:   07/10/18 1234 07/10/18 1245 07/10/18 1300 07/10/18 1330  BP:   (!) 122/56   Pulse:  88  90  Resp:  14  17  Temp: 97.7 F (36.5 C)     TempSrc: Rectal     SpO2:  97%  97%  Weight:      Height:         . General: Cachectic elderly female lying in bed, moaning and occasionally crying. Nonverbal. Lying on her L side in fetal position. . Eyes: PERRL, EOMI, normal lids, iris . ENT: grossly normal hearing, lips & tongue, mmm . Neck: no LAD, masses or thyromegaly; no carotid bruits . Cardiovascular:  normal rate, reg rhythm, no murmur. Marland Kitchen Respiratory:   CTA bilaterally with no wheezes/rales/rhonchi.  Normal respiratory effort. . Abdomen: thin, soft, NT, ND, NABS . Back:   grossly normal alignment, no  CVAT . Skin:  no rash or induration seen on limited exam . Musculoskeletal: diffuse muscle atrophy;  . Lower extremity:  No LE edema.  Limited foot exam with no ulcerations.  2+ distal pulses. Marland Kitchen Psychiatric: anxious appearing . Neurologic: Patient is drowsy, moaning, able to track me across the midline with repeated loud encouragement, was able to attempt to  squeeze my fingers with her L hand, not her R; otherwise not able to follow commands. CN 2-12 grossly intact. Sensation appears intact. 2+ symmetric DTRs (patellar)    Radiological Exams on Admission: Dg Chest 2 View  Result Date: 07/10/2018 CLINICAL DATA:  Pt arrives to ED from home with complaints of right sided weakness, R sided facial droop, and being mute since 1900 11/13. EMS reports that patient has history of parkinson's and dementia EXAM: CHEST - 2 VIEW COMPARISON:  11/30/2015 FINDINGS: Cardiac silhouette is mildly enlarged. No mediastinal or hilar masses. No convincing adenopathy. Lungs are clear.  No pleural effusion or pneumothorax. Skeletal structures are demineralized. There are multiple old healed rib fractures. IMPRESSION: No acute cardiopulmonary disease. Electronically Signed   By: Lajean Manes M.D.   On: 07/10/2018 13:18   Ct Head Code Stroke Wo Contrast  Result Date: 07/10/2018 CLINICAL DATA:  Code stroke. Right-sided weakness. Altered level of consciousness. EXAM: CT HEAD WITHOUT CONTRAST TECHNIQUE: Contiguous axial images were obtained from the base of the skull through the vertex without intravenous contrast. COMPARISON:  CT head 11/30/2015 FINDINGS: Brain: Moderate atrophy. Chronic encephalomalacia inferior frontal lobe bilaterally is unchanged. Patchy white matter hypodensity unchanged. Chronic infarct in the right cerebellum unchanged. Negative for acute infarct, hemorrhage, or mass. Vascular: Negative for hyperdense vessel Skull: No focal skull lesion. Chronic fracture of the dens. Progressive erosion of the tip of  the dens with progressive basilar invagination. Progressive stenosis at the foramen magnum. Sinuses/Orbits: Paranasal sinuses clear. Bilateral cataract surgery. Other: None ASPECTS ([place] Stroke Program Early CT Score) - Ganglionic level infarction (caudate, lentiform nuclei, internal capsule, insula, M1-M3 cortex): 7 - Supraganglionic infarction (M4-M6 cortex): 3 Total score (0-10 with 10 being normal): 10 IMPRESSION: 1. No acute intracranial abnormality 2. Atrophy and chronic ischemic changes are stable from the prior CT. 3. Chronic fracture of the dens with progressive erosion of the dens and progressive basilar invagination. Moderate stenosis at the foramen magnum. 4. ASPECTS is 10 Electronically Signed   By: Franchot Gallo M.D.   On: 07/10/2018 10:47    EKG: Independently reviewed.  NSR with rate 82 Abnormal R-wave progression, early transition Nonspecific T abnrm, anterolateral leads Prolonged QT interval (QTc 566)   Labs on Admission: I have personally reviewed the available labs and imaging studies at the time of the admission.  Pertinent labs:  Sodium 142 potassium 3.8 chloride 108 glucose 130 BUN 23 creatinine 1.09 (baseline), calcium 9.6 AST 43 ALT 17 total protein 7.3 albumin 3.9 alkaline phosphatase 72 total bilirubin 0.8 Troponin 0 0.01 White blood cells 7.1 hemoglobin 14.2 platelets 241 INR 1.02 PTT 29    Assessment/Plan Principal Problem:   Weakness Active Problems:   Parkinson's disease (HCC)   Dementia due to Parkinson's disease without behavioral disturbance (HCC)   Protein-calorie malnutrition, severe (HCC)   Essential hypertension   Hyperlipidemia   Right sided weakness   Acute lower UTI   Weakness, reported as unilateral but on further interviewing with family, appears to be more generalized. Possible TIA/CVA but not TPA or IR candidate and cannot get MRI as pt reportedly had metal implant in her ear in the 1970s (unable to confirm). Weakness and increased  moaning/crying could be due to UTI. -Will admit to observation status for CVA/TIA evaluation -Telemetry monitoring -Risk stratification with FLP, A1c; will also check TSH -ASA daily -Neurology consult appreciated -PT/OT/ST/Nutrition Consults -Have asked palliative care consult to see pt and discuss GOC; appreciate assistance  Acute Cystitis -cont Rocephin 1  g daily -f/u UCx, tailor abx accordingly  HTN -Allow permissive HTN for now -Treat BP only if >220/120, and then with goal of 15% reduction -Hold BP meds and plan to restart in 48-72 hours   HLD -Check FLP -cont pravastatin 80 mg  Parkinsons Disease with dementia -Cont Sinemet, Aricept, Namenda -frequent reorientation -avoid psychoactive meds -fall precautions / bedrest -palliative consult as above   DVT prophylaxis:  Lovenox GFR <30 Code Status:  DNR - confirmed with patient/family Family Communication: son Ray at bedside  Disposition Plan: TBD  Consults called: neurology, palliative  Admission status: Admit - It is my clinical opinion that admission to INPATIENT is reasonable and necessary because of the expectation that this patient will require hospital care that crosses at least 2 midnights to treat this condition based on the medical complexity of the problems presented.  Given the aforementioned information, the predictability of an adverse outcome is felt to be significant.    Janora Norlander MD [facility]  If note is complete, please contact covering daytime or nighttime physician. [email or web address] Password [email or web address]  07/10/2018, 2:07 PM

## 2018-07-10 NOTE — ED Triage Notes (Signed)
Pt arrives to ED from home with complaints of right sided weakness, R sided facial droop, and being mute since 1900 11/13. EMS reports that patient has history of parkinson's and dementia. Pts baseline is wheel chair bound with incomprehensible speech. Pt placed in position of comfort with bed locked and lowered, call bell in reach.

## 2018-07-10 NOTE — Consult Note (Addendum)
Neurology Consultation  Reason for Consult: Code stroke Referring Physician: ED MD  CC: Nonverbal and right-sided weakness  History is obtained from: EMS  HPI: KYILEE GREGG is a 84 y.o. female history of Parkinson's, hypertension, hypercholesterolemia, dementia,nonverbal and bedridden at baseline.  She sees Dr. Rexene Alberts as an outpatient.  Her last visit was on 10/08/2017.  Patient has left-sided Parkinson's disease complicated by severe hearing loss, recurrent falls, memory loss, hallucinations.  EMS was called due to family members noting that she is leaning to the right with right-sided arm weakness.  Her last known normal was approximately 1900 hrs. yesterday which would be 07/09/2018.  On arrival, patient was brought to CT.  CT did not show any acute intracranial abnormalities.  ED course: CT head and i-STAT  Chart review: As above with Dr. Rexene Alberts  LKW: 1900 hrs. on 07/09/2018 tpa given?: no, out of window Premorbid modified Rankin scale (mRS): 4 NIH stroke score: 17 ICH Score: No blood found  ZWC:HENIDP to obtain due to altered mental status.   Past Medical History:  Diagnosis Date  . Back pain    lower back pain , C2 fracture from fall  . Dementia   . High cholesterol    since 2000  . Hypertension    since 1985  . Obstructive apnea   . Parkinson disease ([place])      Family History  Problem Relation Age of Onset  . Cancer Mother   . Congestive Heart Failure Father   . Diabetes Other   . Hypertension Other   . Hyperlipidemia Other   . Heart disease Other      Social History:   reports that she has never smoked. She has never used smokeless tobacco. She reports that she does not drink alcohol or use drugs.  Medications No current facility-administered medications for this encounter.   Current Outpatient Medications:  .  atenolol-chlorthalidone (TENORETIC) 50-25 MG per tablet, Take 1 tablet by mouth daily., Disp: , Rfl:  .  carbidopa-levodopa (SINEMET IR) 25-100  MG tablet, Take 1 tablet by mouth 2 (two) times daily., Disp: 180 tablet, Rfl: 3 .  donepezil (ARICEPT) 5 MG tablet, TAKE 1 TABLET BY MOUTH AT  BEDTIME, Disp: 90 tablet, Rfl: 3 .  memantine (NAMENDA XR) 14 MG CP24 24 hr capsule, TAKE 1 CAPSULE BY MOUTH  DAILY, Disp: 90 capsule, Rfl: 3 .  polyethylene glycol (MIRALAX / GLYCOLAX) packet, Take 17 g by mouth 2 (two) times daily., Disp: 14 each, Rfl: 0 .  potassium chloride SA (K-DUR,KLOR-CON) 20 MEQ tablet, Take 20 mEq by mouth daily., Disp: , Rfl:  .  pravastatin (PRAVACHOL) 80 MG tablet, Take 80 mg by mouth daily., Disp: , Rfl:  .  traMADol (ULTRAM) 50 MG tablet, Take 1 tablet (50 mg total) by mouth every 6 (six) hours as needed for moderate pain., Disp: 30 tablet, Rfl: 0 .  VESICARE 5 MG tablet, , Disp: , Rfl:    Exam: Current vital signs: There were no vitals taken for this visit. Vital signs in last 24 hours:    Physical Exam  Constitutional: Cachectic Psych: Affect appropriate to situation Eyes: No scleral injection HENT: No OP obstrucion Head: Normocephalic.  Cardiovascular: Normal rate and regular rhythm.  Respiratory: Effort normal, non-labored breathing GI: Soft.  No distension. There is no tenderness.  Skin: WDI  Neuro: Mental Status: Patient is nonverbal at baseline, does not follow commands, withdraws from pain Cranial Nerves: II: Blinks to threat. pupils are equal, round, and  reactive to light.   III,IV, VI: No significant gaze deviation.  Doll's eyes intact V: Able to ascertain as she is nonverbal VII: Facial movement is symmetric.  VIII: hearing is intact to voice X: Uvula elevates symmetrically XII: tongue is midline without atrophy or fasciculations.  Motor: Did on the right arm, antigravity on the left arm and antigravity bilateral legs with contractions at both knees and also ankles with contraction of the right ankle causing inversion Sensory: Draws to noxious stimuli in all extremities Deep Tendon  Reflexes: 2+ and symmetric with no Achilles Plantars: Mute bilaterally Cerebellar: Able to ascertain  Labs I have reviewed labs in epic and the results pertinent to this consultation are:   CBC    Component Value Date/Time   WBC 7.1 07/10/2018 1031   RBC 4.71 07/10/2018 1031   HGB 14.3 07/10/2018 1035   HCT 42.0 07/10/2018 1035   PLT 241 07/10/2018 1031   MCV 94.5 07/10/2018 1031   MCH 30.1 07/10/2018 1031   MCHC 31.9 07/10/2018 1031   RDW 13.2 07/10/2018 1031   LYMPHSABS 2.9 07/10/2018 1031   MONOABS 0.7 07/10/2018 1031   EOSABS 0.2 07/10/2018 1031   BASOSABS 0.1 07/10/2018 1031    CMP     Component Value Date/Time   NA 143 07/10/2018 1035   K 3.9 07/10/2018 1035   CL 107 07/10/2018 1035   CO2 24 12/02/2015 0621   GLUCOSE 127 (H) 07/10/2018 1035   BUN 25 (H) 07/10/2018 1035   CREATININE 1.00 07/10/2018 1035   CALCIUM 9.6 12/02/2015 0621   PROT 6.8 12/02/2015 0621   ALBUMIN 3.1 (L) 12/02/2015 0621   AST 29 12/02/2015 0621   ALT 8 (L) 12/02/2015 0621   ALKPHOS 211 (H) 12/02/2015 0621   BILITOT 0.9 12/02/2015 0621   GFRNONAA 41 (L) 12/02/2015 0621   GFRAA 48 (L) 12/02/2015 0621    Lipid Panel  No results found for: CHOL, TRIG, HDL, CHOLHDL, VLDL, LDLCALC, LDLDIRECT   Imaging I have reviewed the images obtained:  CT-scan of the brain--no acute intracranial abnormality   Etta Quill PA-C [facility] [phone]  M-F  (9:00 am- 5:00 PM)  07/10/2018, 10:50 AM   Assessment:  84 year old female presenting to the hospital with new right-sided weakness.  Possible gaze deviation however on our exam there was no deviation.  Patient was not a candidate for TPA nor a candidate for interventional radiology.   Impression: Possible stroke however not a candidate for either TPA nor she candidate for IR due poor functional baseline.  Recommendations: -MRI brain  - Palliative care consult - ASA 325mg  daily - NPO until stroke swallow - Neurochecks     NEUROHOSPITALIST ADDENDUM Performed a face to face diagnostic evaluation.   I have reviewed the contents of history and physical exam as documented by PA/ARNP/Resident and agree with above documentation.  I have discussed and formulated the above plan as documented. Edits to the note have been made as needed.   84 year old female with past medical history of advanced dementia, Parkinson's-wheelchair-bound and mostly nonverbal at baseline.  Modified Rankin score is a 4-5.  She was brought as a stroke alert being found having right facial droop right-sided weakness by family.  Last seen normal last night. NIHSS  17.  CT head no showed no bleed.  Not a TPA candidate as she was outside window and not a IR candidate due to poor functional baseline.  Suspect that she had a left MCA stroke.  Consider  palliative care consult.  Can start aspirin for stroke prophylaxis.   Karena Addison Jeena Arnett MD [facility] [phone]   If 7pm to 7am, please call on call as listed on AMION.

## 2018-07-11 ENCOUNTER — Inpatient Hospital Stay (HOSPITAL_COMMUNITY): Payer: Medicare Other

## 2018-07-11 DIAGNOSIS — R531 Weakness: Secondary | ICD-10-CM

## 2018-07-11 DIAGNOSIS — F0280 Dementia in other diseases classified elsewhere without behavioral disturbance: Secondary | ICD-10-CM

## 2018-07-11 DIAGNOSIS — G20 Parkinson's disease: Secondary | ICD-10-CM

## 2018-07-11 DIAGNOSIS — Z515 Encounter for palliative care: Secondary | ICD-10-CM

## 2018-07-11 DIAGNOSIS — I10 Essential (primary) hypertension: Secondary | ICD-10-CM

## 2018-07-11 LAB — BASIC METABOLIC PANEL
Anion gap: 13 (ref 5–15)
BUN: 25 mg/dL — ABNORMAL HIGH (ref 8–23)
CO2: 22 mmol/L (ref 22–32)
Calcium: 9 mg/dL (ref 8.9–10.3)
Chloride: 108 mmol/L (ref 98–111)
Creatinine, Ser: 1.14 mg/dL — ABNORMAL HIGH (ref 0.44–1.00)
GFR calc Af Amer: 48 mL/min — ABNORMAL LOW (ref >60)
GFR calc non Af Amer: 42 mL/min — ABNORMAL LOW (ref >60)
Glucose, Bld: 73 mg/dL (ref 70–99)
Potassium: 3.4 mmol/L — ABNORMAL LOW (ref 3.5–5.1)
Sodium: 143 mmol/L (ref 135–145)

## 2018-07-11 LAB — CBC
HCT: 37.7 % (ref 36.0–46.0)
Hemoglobin: 11.9 g/dL — ABNORMAL LOW (ref 12.0–15.0)
MCH: 29.3 pg (ref 26.0–34.0)
MCHC: 31.6 g/dL (ref 30.0–36.0)
MCV: 92.9 fL (ref 80.0–100.0)
Platelets: 231 10*3/uL (ref 150–400)
RBC: 4.06 MIL/uL (ref 3.87–5.11)
RDW: 13.5 % (ref 11.5–15.5)
WBC: 6.4 10*3/uL (ref 4.0–10.5)
nRBC: 0 % (ref 0.0–0.2)

## 2018-07-11 LAB — LIPID PANEL
Cholesterol: 136 mg/dL (ref 0–200)
HDL: 49 mg/dL (ref >40)
LDL Cholesterol: 66 mg/dL (ref 0–99)
Total CHOL/HDL Ratio: 2.8 RATIO
Triglycerides: 107 mg/dL (ref <150)
VLDL: 21 mg/dL (ref 0–40)

## 2018-07-11 LAB — TSH: TSH: 2.693 u[IU]/mL (ref 0.350–4.500)

## 2018-07-11 LAB — HEMOGLOBIN A1C
Hgb A1c MFr Bld: 4.9 % (ref 4.8–5.6)
Mean Plasma Glucose: 93.93 mg/dL

## 2018-07-11 MED ORDER — IOPAMIDOL (ISOVUE-370) INJECTION 76%
100.0000 mL | Freq: Once | INTRAVENOUS | Status: AC | PRN
  Administered 2018-07-11: 100 mL via INTRAVENOUS

## 2018-07-11 MED ORDER — IOPAMIDOL (ISOVUE-370) INJECTION 76%
INTRAVENOUS | Status: AC
  Filled 2018-07-11: qty 100

## 2018-07-11 MED ORDER — POTASSIUM CHLORIDE 10 MEQ/100ML IV SOLN
10.0000 meq | INTRAVENOUS | Status: AC
  Administered 2018-07-11 (×3): 10 meq via INTRAVENOUS
  Filled 2018-07-11 (×3): qty 100

## 2018-07-11 NOTE — Progress Notes (Addendum)
STROKE TEAM PROGRESS NOTE   INTERVAL HISTORY Her family is at the bedside.  She is crying in bed. SLP at bedside for cognitive eval ordered.  Based on chronic dementia at baseline, will cancel cognitive and ask speech therapy to see her for swallowing.  Vitals:   07/10/18 1931 07/10/18 2007 07/10/18 2247 07/11/18 0400  BP: 122/76 (!) 152/64 (!) 122/59 (!) 121/59  Pulse: 94 91 92 97  Resp: 20 20 16 17   Temp:  98.2 F (36.8 C) 97.8 F (36.6 C) 98.5 F (36.9 C)  TempSrc:  Axillary Oral Axillary  SpO2: 96% 97% 96% 95%  Weight:  44.6 kg    Height:        CBC:  Recent Labs  Lab 07/10/18 1031 07/10/18 1035 07/11/18 0421  WBC 7.1  --  6.4  NEUTROABS 3.2  --   --   HGB 14.2 14.3 11.9*  HCT 44.5 42.0 37.7  MCV 94.5  --  92.9  PLT 241  --  224    Basic Metabolic Panel:  Recent Labs  Lab 07/10/18 1031 07/10/18 1035 07/11/18 0421  NA 142 143 143  K 3.8 3.9 3.4*  CL 108 107 108  CO2 22  --  22  GLUCOSE 130* 127* 73  BUN 23 25* 25*  CREATININE 1.09* 1.00 1.14*  CALCIUM 9.6  --  9.0   Lipid Panel:     Component Value Date/Time   CHOL 136 07/11/2018 0421   TRIG 107 07/11/2018 0421   HDL 49 07/11/2018 0421   CHOLHDL 2.8 07/11/2018 0421   VLDL 21 07/11/2018 0421   LDLCALC 66 07/11/2018 0421   HgbA1c:  Lab Results  Component Value Date   HGBA1C 4.9 07/11/2018   Urine Drug Screen:     Component Value Date/Time   LABOPIA NONE DETECTED 07/10/2018 1231   COCAINSCRNUR NONE DETECTED 07/10/2018 1231   LABBENZ NONE DETECTED 07/10/2018 1231   AMPHETMU NONE DETECTED 07/10/2018 1231   THCU NONE DETECTED 07/10/2018 1231   LABBARB NONE DETECTED 07/10/2018 1231    Alcohol Level     Component Value Date/Time   ETH <10 07/10/2018 1031    IMAGING Ct Angio Head W Or Wo Contrast  Result Date: 07/11/2018 CLINICAL DATA:  Followup code stroke. Dementia. Parkinson's disease. Right-sided upper extremity weakness. EXAM: CT ANGIOGRAPHY HEAD AND NECK TECHNIQUE: Multidetector CT  imaging of the head and neck was performed using the standard protocol during bolus administration of intravenous contrast. Multiplanar CT image reconstructions and MIPs were obtained to evaluate the vascular anatomy. Carotid stenosis measurements (when applicable) are obtained utilizing NASCET criteria, using the distal internal carotid diameter as the denominator. CONTRAST:  170mL ISOVUE-370 IOPAMIDOL (ISOVUE-370) INJECTION 76% COMPARISON:  Head CT yesterday FINDINGS: CT HEAD FINDINGS Brain: No appreciable change since yesterday. Generalized atrophy. Old inferior cerebellar infarction on the right. Old right frontal cortical and subcortical infarction. Chronic small-vessel ischemic changes of the white matter. Vascular: There is atherosclerotic calcification of the major vessels at the base of the brain. Skull: Negative Sinuses: Clear Orbits: Negative Review of the MIP images confirms the above findings CTA NECK FINDINGS Aortic arch: Aortic atherosclerosis. Branching pattern is normal without flow limiting origin stenosis. 30% narrowing of the left subclavian origin. Right carotid system: Common carotid artery is tortuous but widely patent to the bifurcation. There is atherosclerotic plaque at the carotid bifurcation and ICA bulb. Minimal diameter of the proximal ICA is 3 mm. Compared to a more distal cervical ICA diameter of  4 mm, this indicates a 25% stenosis. Left carotid system: Common carotid artery is widely patent to the bifurcation. There is calcified plaque at the carotid bifurcation and ICA bulb. Minimal diameter of the ICA bulb is 4 mm. Compared to a more distal cervical ICA diameter of 4 mm, there is no stenosis. Vertebral arteries: Calcified plaque at the right vertebral artery origin but without origin stenosis. Beyond that, the vessel becomes narrow and appears to be occluded in the lower cervical region. The left vertebral artery shows wide patency at its origin. The vessel is patent through the  cervical region to the foramen magnum. The distal right vertebral artery is reconstituted by cervical collaterals at the C1 level as a robust vessel. There is 50% stenosis as the vessel penetrates the dura at the foramen magnum. Skeleton: Mild degenerative spondylosis. 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 This is unchanged from previous studies. Other neck: No mass or lymphadenopathy. Upper chest: Normal Review of the MIP images confirms the above findings CTA HEAD FINDINGS Anterior circulation: Both internal carotid arteries are widely patent through the skull base and siphon regions. No stenosis. The anterior and middle cerebral vessels are patent without evidence of proximal stenosis or aneurysm. No occluded branch vessels are identified. Posterior circulation: Both vertebral arteries contribute to the basilar. No basilar stenosis. Posterior circulation branch vessels are patent. Venous sinuses: Patent and normal. Anatomic variants: None significant. Delayed phase: No abnormal enhancement. Review of the MIP images confirms the above findings IMPRESSION: No acute vascular finding. Atherosclerotic change at both carotid bifurcations and ICA bulbs. Despite the presence of atherosclerosis, there is no flow limiting stenosis. 25% narrowing of the ICA bulb on the right. No stenosis on the left. No intracranial anterior circulation large or medium vessel abnormality. Dominant left vertebral artery widely patent. Non dominant right vertebral artery is occluded in the lower cervical region and reconstituted in the upper cervical region by cervical collaterals. No intracranial posterior circulation large or medium vessel abnormality presently. Electronically Signed   By: Nelson Chimes M.D.   On: 07/11/2018 14:17   Dg Chest 2 View  Result Date: 07/10/2018 CLINICAL DATA:  Pt arrives to ED from home with complaints of right sided weakness, R sided facial droop, and being mute since  1900 11/13. EMS reports that patient has history of parkinson's and dementia EXAM: CHEST - 2 VIEW COMPARISON:  11/30/2015 FINDINGS: Cardiac silhouette is mildly enlarged. No mediastinal or hilar masses. No convincing adenopathy. Lungs are clear.  No pleural effusion or pneumothorax. Skeletal structures are demineralized. There are multiple old healed rib fractures. IMPRESSION: No acute cardiopulmonary disease. Electronically Signed   By: Lajean Manes M.D.   On: 07/10/2018 13:18   Ct Angio Neck W Or Wo Contrast  Result Date: 07/11/2018 CLINICAL DATA:  Followup code stroke. Dementia. Parkinson's disease. Right-sided upper extremity weakness. EXAM: CT ANGIOGRAPHY HEAD AND NECK TECHNIQUE: Multidetector CT imaging of the head and neck was performed using the standard protocol during bolus administration of intravenous contrast. Multiplanar CT image reconstructions and MIPs were obtained to evaluate the vascular anatomy. Carotid stenosis measurements (when applicable) are obtained utilizing NASCET criteria, using the distal internal carotid diameter as the denominator. CONTRAST:  164mL ISOVUE-370 IOPAMIDOL (ISOVUE-370) INJECTION 76% COMPARISON:  Head CT yesterday FINDINGS: CT HEAD FINDINGS Brain: No appreciable change since yesterday. Generalized atrophy. Old inferior cerebellar infarction on the right. Old right frontal cortical and subcortical infarction. Chronic small-vessel ischemic changes of the white  matter. Vascular: There is atherosclerotic calcification of the major vessels at the base of the brain. Skull: Negative Sinuses: Clear Orbits: Negative Review of the MIP images confirms the above findings CTA NECK FINDINGS Aortic arch: Aortic atherosclerosis. Branching pattern is normal without flow limiting origin stenosis. 30% narrowing of the left subclavian origin. Right carotid system: Common carotid artery is tortuous but widely patent to the bifurcation. There is atherosclerotic plaque at the carotid  bifurcation and ICA bulb. Minimal diameter of the proximal ICA is 3 mm. Compared to a more distal cervical ICA diameter of 4 mm, this indicates a 25% stenosis. Left carotid system: Common carotid artery is widely patent to the bifurcation. There is calcified plaque at the carotid bifurcation and ICA bulb. Minimal diameter of the ICA bulb is 4 mm. Compared to a more distal cervical ICA diameter of 4 mm, there is no stenosis. Vertebral arteries: Calcified plaque at the right vertebral artery origin but without origin stenosis. Beyond that, the vessel becomes narrow and appears to be occluded in the lower cervical region. The left vertebral artery shows wide patency at its origin. The vessel is patent through the cervical region to the foramen magnum. The distal right vertebral artery is reconstituted by cervical collaterals at the C1 level as a robust vessel. There is 50% stenosis as the vessel penetrates the dura at the foramen magnum. Skeleton: Mild degenerative spondylosis. 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 This is unchanged from previous studies. Other neck: No mass or lymphadenopathy. Upper chest: Normal Review of the MIP images confirms the above findings CTA HEAD FINDINGS Anterior circulation: Both internal carotid arteries are widely patent through the skull base and siphon regions. No stenosis. The anterior and middle cerebral vessels are patent without evidence of proximal stenosis or aneurysm. No occluded branch vessels are identified. Posterior circulation: Both vertebral arteries contribute to the basilar. No basilar stenosis. Posterior circulation branch vessels are patent. Venous sinuses: Patent and normal. Anatomic variants: None significant. Delayed phase: No abnormal enhancement. Review of the MIP images confirms the above findings IMPRESSION: No acute vascular finding. Atherosclerotic change at both carotid bifurcations and ICA bulbs. Despite the presence  of atherosclerosis, there is no flow limiting stenosis. 25% narrowing of the ICA bulb on the right. No stenosis on the left. No intracranial anterior circulation large or medium vessel abnormality. Dominant left vertebral artery widely patent. Non dominant right vertebral artery is occluded in the lower cervical region and reconstituted in the upper cervical region by cervical collaterals. No intracranial posterior circulation large or medium vessel abnormality presently. Electronically Signed   By: Nelson Chimes M.D.   On: 07/11/2018 14:17   Ct Head Code Stroke Wo Contrast  Result Date: 07/10/2018 CLINICAL DATA:  Code stroke. Right-sided weakness. Altered level of consciousness. EXAM: CT HEAD WITHOUT CONTRAST TECHNIQUE: Contiguous axial images were obtained from the base of the skull through the vertex without intravenous contrast. COMPARISON:  CT head 11/30/2015 FINDINGS: Brain: Moderate atrophy. Chronic encephalomalacia inferior frontal lobe bilaterally is unchanged. Patchy white matter hypodensity unchanged. Chronic infarct in the right cerebellum unchanged. Negative for acute infarct, hemorrhage, or mass. Vascular: Negative for hyperdense vessel Skull: No focal skull lesion. Chronic fracture of the dens. Progressive erosion of the tip of the dens with progressive basilar invagination. Progressive stenosis at the foramen magnum. Sinuses/Orbits: Paranasal sinuses clear. Bilateral cataract surgery. Other: None ASPECTS ([place] Stroke Program Early CT Score) - Ganglionic level infarction (caudate, lentiform nuclei, internal capsule,  insula, M1-M3 cortex): 7 - Supraganglionic infarction (M4-M6 cortex): 3 Total score (0-10 with 10 being normal): 10 IMPRESSION: 1. No acute intracranial abnormality 2. Atrophy and chronic ischemic changes are stable from the prior CT. 3. Chronic fracture of the dens with progressive erosion of the dens and progressive basilar invagination. Moderate stenosis at the foramen magnum. 4.  ASPECTS is 10 Electronically Signed   By: Franchot Gallo M.D.   On: 07/10/2018 10:47   Vas US Carotid  Result Date: 07/11/2018 Carotid Arterial Duplex Study Indications:   CVA and Weakness. Right arm Risk Factors:  Hypertension, hyperlipidemia. Other Factors: Parkinsons, dementia. Performing Technologist: Toma Copier RVS  Examination Guidelines: A complete evaluation includes B-mode imaging, spectral Doppler, color Doppler, and power Doppler as needed of all accessible portions of each vessel. Bilateral testing is considered an integral part of a complete examination. Limited examinations for reoccurring indications may be performed as noted.  Right Carotid Findings: +----------+--------+--------+--------+--------------------+-------------------+           PSV cm/sEDV cm/sStenosisDescribe            Comments            +----------+--------+--------+--------+--------------------+-------------------+ CCA Prox  87      17                                  mild intimal                                                              changes             +----------+--------+--------+--------+--------------------+-------------------+ CCA Distal57      13                                  mild intimal                                                              changes             +----------+--------+--------+--------+--------------------+-------------------+ ICA Prox  49      15              diffuse and         mild plaque                                           heterogenous                            +----------+--------+--------+--------+--------------------+-------------------+ ICA Mid   58      15                                                      +----------+--------+--------+--------+--------------------+-------------------+  ICA Distal86      28                                  tortuous             +----------+--------+--------+--------+--------------------+-------------------+ ECA       46      2                                   intimal thickening  +----------+--------+--------+--------+--------------------+-------------------+ +----------+--------+-------+--------+-------------------+           PSV cm/sEDV cmsDescribeArm Pressure (mmHG) +----------+--------+-------+--------+-------------------+ [id number]     0                                  +----------+--------+-------+--------+-------------------+ +---------+--------+--+--------+-+ VertebralPSV cm/s20EDV cm/s4 +---------+--------+--+--------+-+  Left Carotid Findings: +----------+--------+--------+--------+------------+--------------------+           PSV cm/sEDV cm/sStenosisDescribe    Comments             +----------+--------+--------+--------+------------+--------------------+ CCA Prox  39      8                           mild intimal changes +----------+--------+--------+--------+------------+--------------------+ CCA Distal50      9                           mild intimal changes +----------+--------+--------+--------+------------+--------------------+ ICA Prox  56      15              heterogenousmild plaque          +----------+--------+--------+--------+------------+--------------------+ ICA Mid   112     36              heterogenousmild plaque          +----------+--------+--------+--------+------------+--------------------+ ICA Distal70      23                          tortuous             +----------+--------+--------+--------+------------+--------------------+ ECA       46      7               heterogenousmild plaque          +----------+--------+--------+--------+------------+--------------------+ +----------+--------+--------+--------+-------------------+ SubclavianPSV cm/sEDV cm/sDescribeArm Pressure (mmHG)  +----------+--------+--------+--------+-------------------+           79                                          +----------+--------+--------+--------+-------------------+ +---------+--------+--+--------+-+ VertebralPSV cm/s29EDV cm/s8 +---------+--------+--+--------+-+  Summary: Right Carotid: Velocities in the right ICA are consistent with a 1-39% stenosis. Left Carotid: Velocities in the left ICA are consistent with a 1-39% stenosis. Vertebrals:  Bilateral vertebral arteries demonstrate antegrade flow. Subclavians: Normal flow hemodynamics were seen in bilateral subclavian              arteries. *See table(s) above for measurements and observations.  Electronically signed by Antony Contras MD on 07/11/2018 at 2:47:51 PM.    Final     PHYSICAL EXAM Frail elderly lady in appearance to be  in distress and is crying and is not cooperative for exam. . Afebrile. Head is nontraumatic. Neck is supple without bruit.    Cardiac exam no murmur or gallop. Lungs are clear to auscultation. Distal pulses are well felt. Neurological Exam : patient is very uncooperative for exam. She is crying and resists attempts to examine her.she appears to be nonverbal. She does not speak or follow any commands. She does withdraw to painful stimuli in all 4 extremities but moves the right side less than the left. She is antigravity strength but does keep her knees and ankles contracted) to any passive movements. ASSESSMENT/PLAN Ms. KENNIDEE HEYNE is a 84 y.o. female with history of Parkinson's, hypertension, hypercholesterolemia, dementia,nonverbal and bedridden at baseline presenting with leaning to the right with right-sided arm weakness.   Delirium in lady with chronic dementia, doubt TIA   Code Stroke CT head No acute stroke. Small vessel disease. Atrophy. Chronic fx of dens with progressive erosion and basilar invagination. Mod stenosis at foramen magnum. ASPECTS 10.     CTA head & neck no acute finding.   Atherosclerosis bilateral carotids.  No flow-limiting stenosis.  Nondominant right vertebral artery occluded  Carotid Doppler  B ICA 1-39% stenosis, VAs antegrade   2D Echo pending  LDL 66  HgbA1c 4.9  Lovenox 30 mg sq daily for VTE prophylaxis  No antithrombotic prior to admission, now on aspirin 81 mg daily.   Therapy recommendations: Pending  Disposition: Pending  Followed by Dr. Rexene Alberts as OP  Palliative care has consulted.  Goals are for her husband to take patient home regardless of plan-home health versus hospice  Hypertension  Stable  Hyperlipidemia  Home meds: Pravachol 80, resumed in hospital  LDL 66, goal < 70  Continue statin at discharge  Other Stroke Risk Factors  Advanced age  Obstructive sleep apnea  Other Active Problems  Acute cystitis  Parkinson's disease with dementia  Chronic neck fracture  Multiple falls  Hospital day # [place], MSN, APRN, ANVP-BC, AGPCNP-BC Advanced Practice Stroke Nurse [place] for Schedule & Pager information 07/11/2018 4:41 PM  I have personally examined this patient, reviewed notes, independently viewed imaging studies, participated in medical decision making and plan of care.ROS completed by me personally and pertinent positives fully documented  I have made any additions or clarifications directly to the above note. Agree with note above.patient has a poor neurological baseline and given her present status she may not be very cooperative for exam or further testing and agree with palliative care approach. Greater than 50% time during this 25 minute visit was spent on coordination of care and discussion with keratin. Stroke team will sign off. Kindly call for questions.  Antony Contras, MD Medical Director [facility] Pager: [phone] 07/11/2018 5:21 PM  To contact Stroke Continuity provider, please refer to [email or web address] After hours, contact General Neurology

## 2018-07-11 NOTE — Progress Notes (Signed)
Carotid duplex completed - Preliminary results - 1% t0 39% ICA stenosis. Vertebral artery flow is antegrade. [employer], RVS 07/11/2018,2:51 PM

## 2018-07-11 NOTE — Evaluation (Signed)
Clinical/Bedside Swallow Evaluation Patient Details  Name: Kathleen Haynes MRN: [id number] Date of Birth: [birth year]  Today's Date: 07/11/2018 Time: SLP Start Time (ACUTE ONLY): 1100 SLP Stop Time (ACUTE ONLY): 1145 SLP Time Calculation (min) (ACUTE ONLY): 45 min  Past Medical History:  Past Medical History:  Diagnosis Date  . Back pain    lower back pain , C2 fracture from fall  . Dementia ([place])   . High cholesterol    since 2000  . Hypertension    since 1985  . Obstructive apnea   . Parkinson disease [facility])    Past Surgical History:  Past Surgical History:  Procedure Laterality Date  . CERVICAL SPINE SURGERY     02/2011, 03/2011  . LUMBAR SPINE SURGERY    . rotary cuff Right   . SHOULDER SURGERY Left    [id number]   HPI:  84 y.o. female admitted 07/10/18 with RUE weakness, increased pain. PMH: Parkinson's disease, dementia, often nonverbal, hypertension, hyperlipidemia, protein calorie malnutrition. CXR and Head CT negative   Assessment / Plan / Recommendation Clinical Impression  Oral care was completed with suction. Pt with adequate dentition, but is missing several teeth. Husband reports pt was tolerating regular solids and thin liquids prior to admit. Following oral care, pt accepted individual ice chips x4 with adequate oral prep and no overt s/s aspiration. Pt was then given small boluses of puree, and sips of water via straw. Again, oral stage appeared functional, and there were no overt s/s aspiration on either consistency.   Pt aspiration risk is increased, due to advanced age, and diagnoses of Parkinson's disease and dementia. Recommend beginning conservative diet of puree diet and thin liquids when pt is alert and seated as upright as tolerated. Safe swallow precautions posted at [facility] and reviewed with pt's spouse. SLP will follow to assess diet tolerance and provide education.   RN and MD informed of results and recommendations.   SLP Visit Diagnosis: Dysphagia,  unspecified (R13.10)    Aspiration Risk  Moderate aspiration risk    Diet Recommendation Dysphagia 1 (Puree);Thin liquid   Liquid Administration via: Straw Medication Administration: Crushed with puree Supervision: Full supervision/cueing for compensatory strategies Compensations: Minimize environmental distractions;Small sips/bites;Slow rate Postural Changes: Seated upright at 90 degrees;Remain upright for at least 30 minutes after po intake    Other  Recommendations Oral Care Recommendations: Oral care before and after PO   Follow up Recommendations 24 hour supervision/assistance      Frequency and Duration min 1 x/week  1 week;2 weeks       Prognosis Prognosis for Safe Diet Advancement: Fair Barriers to Reach Goals: Cognitive deficits      Swallow Study   General Date of Onset: 07/10/18 HPI: 84 y.o. female admitted 07/10/18 with RUE weakness, increased pain. PMH: Parkinson's disease, dementia, often nonverbal, hypertension, hyperlipidemia, protein calorie malnutrition. CXR and Head CT negative Type of Study: Bedside Swallow Evaluation Previous Swallow Assessment: noen found Diet Prior to this Study: NPO Temperature Spikes Noted: No Respiratory Status: Room air History of Recent Intubation: No Behavior/Cognition: Alert;Cooperative;Doesn't follow directions Oral Cavity Assessment: Dry Oral Care Completed by SLP: Yes Oral Cavity - Dentition: Missing dentition Self-Feeding Abilities: Total assist Patient Positioning: Upright in bed Baseline Vocal Quality: Normal Volitional Cough: Cognitively unable to elicit Volitional Swallow: Unable to elicit    Oral/Motor/Sensory Function Overall Oral Motor/Sensory Function: (appears functional)   Ice Chips Ice chips: Within functional limits Presentation: Spoon   Thin Liquid Thin Liquid: Within functional limits Presentation:  Straw    Nectar Thick Nectar Thick Liquid: Not tested   Honey Thick Honey Thick Liquid: Not tested    Puree Puree: Impaired Oral Phase Impairments: Reduced lingual movement/coordination;Reduced labial seal;Impaired mastication   Solid     Solid: Not tested     Kathleen Haynes, [facility], [place] Speech Language Pathologist [phone]  Kathleen Haynes 07/11/2018,12:11 PM

## 2018-07-11 NOTE — Consult Note (Signed)
Consultation Note Date: 07/11/2018   Patient Name: Kathleen Haynes  DOB: 28-Jun-1927  MRN: [id number]  Age / Sex: 84 y.o., female  PCP: [name], MD Referring Physician: Bonnielee Haff, MD  Reason for Consultation: Establishing goals of care and Psychosocial/spiritual support  HPI/Patient Profile: 84 y.o. female  with past medical history of parkinson's disease, dementia, neck fracture, multiple falls, and OSA who was admitted on 07/10/2018 with increased pain and weakness.  PMT was consulted on admission to assist with [place].  Working diagnosis is CVA and UTI.   Clinical Assessment and Goals of Care:  I have reviewed medical records including EPIC notes, labs and imaging, received report from Dr. [place] Pink,  and then met at the bedside  with her husband, Ailynn Gow, to discuss diagnosis prognosis, [place], EOL wishes, disposition and options.  I introduced Palliative Medicine as specialized medical care for people living with serious illness. It focuses on providing relief from the symptoms and stress of a serious illness. The goal is to improve quality of life for both the patient and the family.  We discussed a brief life review of the patient.  [name] and Deby have been married over 11 years.  [name] is her 24x7 care taker at home.  Their daughter lives next door and their son lives locally.  [name] mentions that she fractured her neck too many years back for him to tell me when it was.  He also states she was diagnosed with Parkinsons so long ago that he can't tell me when happened either.  He states she normally is "dead weight", but will feed herself and she eats the same food he does.  She washes her "face and butt" herself everyday and he puts her in the bath tube once a week to give her a full bath.  [name] states she seems happy almost all of the time.  He tells me several stories of her falling over  backward.  She is not ambulatory but still attempts to get up on her own.  Donald's goal is to take Cloyce back home.  He confirmed she is DNR.  He seems to have good support for her at home.  Tammie was out of the room during my visit so I committed to come back tomorrow to see her.   Primary Decision Maker:  NEXT OF KIN husband    SUMMARY OF RECOMMENDATIONS    PMT will return tomorrow to follow up on patient's status and talk with family.  Code Status/Advance Care Planning:  DNR  Palliative Prophylaxis:   Delirium Protocol  Psycho-social/Spiritual:   Desire for further Chaplaincy support:  Not at this time.  Prognosis:  Unable to determine    Discharge Planning: Home with Home Health vs Hospice      Primary Diagnoses: Present on Admission: . Parkinson's disease ([place]) . Dementia due to Parkinson's disease without behavioral disturbance ([place]) . Protein-calorie malnutrition, severe ([place]) . Essential hypertension . Hyperlipidemia   I have reviewed the medical record, interviewed the patient and family,  and examined the patient. The following aspects are pertinent.  Past Medical History:  Diagnosis Date  . Back pain    lower back pain , C2 fracture from fall  . Dementia ([place])   . High cholesterol    since 2000  . Hypertension    since 1985  . Obstructive apnea   . Parkinson disease [facility])    Social History   Socioeconomic History  . Marital status: Married    Spouse name: [name]  . Number of children: 2  . Years of education: HS  . Highest education level: Not on file  Occupational History    Employer: RETIRED  Social Needs  . Financial resource strain: Not on file  . Food insecurity:    Worry: Not on file    Inability: Not on file  . Transportation needs:    Medical: Not on file    Non-medical: Not on file  Tobacco Use  . Smoking status: Never Smoker  . Smokeless tobacco: Never Used  Substance and Sexual Activity  . Alcohol use: No  .  Drug use: No  . Sexual activity: Not on file  Lifestyle  . Physical activity:    Days per week: Not on file    Minutes per session: Not on file  . Stress: Not on file  Relationships  . Social connections:    Talks on phone: Not on file    Gets together: Not on file    Attends religious service: Not on file    Active member of club or organization: Not on file    Attends meetings of clubs or organizations: Not on file    Relationship status: Not on file  Other Topics Concern  . Not on file  Social History Narrative   Patient lives at home with spouse.   Caffeine Use: 2 cups daily   Family History  Problem Relation Age of Onset  . Cancer Mother   . Congestive Heart Failure Father   . Diabetes Other   . Hypertension Other   . Hyperlipidemia Other   . Heart disease Other    Scheduled Meds: .  stroke: mapping our early stages of recovery book   Does not apply Once  . aspirin  81 mg Oral Daily  . carbidopa-levodopa  1 tablet Oral BID  . darifenacin  7.5 mg Oral Daily  . donepezil  5 mg Oral QHS  . enoxaparin (LOVENOX) injection  30 mg Subcutaneous Q24H  . iopamidol      . memantine  14 mg Oral BH-q7a  . polyethylene glycol  17 g Oral Once per day on Mon Thu  . potassium chloride SA  20 mEq Oral Daily  . pravastatin  80 mg Oral BH-q7a   Continuous Infusions: . sodium chloride    . sodium chloride 50 mL/hr at 07/11/18 0914  . cefTRIAXone (ROCEPHIN)  IV     PRN Meds:.acetaminophen **OR** acetaminophen (TYLENOL) oral liquid 160 mg/5 mL **OR** acetaminophen, senna-docusate No Known Allergies  Vital Signs: BP 127/69 (BP Location: Right Arm)   Pulse 93   Temp 98.7 F (37.1 C) (Axillary)   Resp 18   Ht 5' 2"  (1.575 m)   Wt 44.6 kg   SpO2 97%   BMI 17.98 kg/m  Pain Scale: Faces     SpO2: SpO2: 97 % O2 Device:SpO2: 97 % O2 Flow Rate: .   IO: Intake/output summary:   Intake/Output Summary (Last 24 hours) at 07/11/2018 1356 Last data filed at 07/11/2018  4688 Gross per 24 hour  Intake 914.4 ml  Output 100 ml  Net 814.4 ml    LBM:   Baseline Weight: Weight: 48 kg Most recent weight: Weight: 44.6 kg     Palliative Assessment/Data: 30%     Time In: 1:30 Time Out: 2:00 Time Total: 30 min. Greater than 50%  of this time was spent counseling and coordinating care related to the above assessment and plan.  Signed by: Florentina Jenny, PA-C Palliative Medicine Pager: [phone]  Please contact Palliative Medicine Team phone at [phone] for questions and concerns.  For individual provider: See Shea Evans

## 2018-07-11 NOTE — Care Management Note (Signed)
Case Management Note  Patient Details  Name: Kathleen Haynes MRN: [id number] Date of Birth: 30-Jan-1927  Subjective/Objective:    Pt in with weakness. She is from home with spouse.                 Action/Plan: Awaiting PT/OT recommendations. Also awaiting palliative consult for GOC. CM following.  Expected Discharge Date:  07/15/18               Expected Discharge Plan:     In-House Referral:     Discharge planning Services     Post Acute Care Choice:    Choice offered to:     DME Arranged:    DME Agency:     HH Arranged:    HH Agency:     Status of Service:  In process, will continue to follow  If discussed at Long Length of Stay Meetings, dates discussed:    Additional Comments:  Pollie Friar, RN 07/11/2018, 12:14 PM

## 2018-07-11 NOTE — Progress Notes (Signed)
OT Cancellation Note  Patient Details Name: Kathleen Haynes MRN: [id number] DOB: Mar 06, 1927   Cancelled Treatment:    Reason Eval/Treat Not Completed: Active bedrest order. Plan to reattempt once activity orders are increased.  Tyrone Schimke, OT Acute Rehabilitation Services Pager: [phone] Office: [phone]  07/11/2018, 1:20 PM

## 2018-07-11 NOTE — Progress Notes (Signed)
Pt arrived from the Ed. Family at bedside. Call bell in reach. Bed alarm on. Skin assessed, intact. Ecchymosis noted to BUE.

## 2018-07-11 NOTE — Progress Notes (Signed)
PT Cancellation Note  Patient Details Name: Kathleen Haynes MRN: [id number] DOB: Jun 06, 1927   Cancelled Treatment:    Reason Eval/Treat Not Completed: Active bedrest order. PT will continue to follow acutely and await updated activity orders.   [place] 07/11/2018, 8:05 AM

## 2018-07-11 NOTE — Progress Notes (Addendum)
[facility] PROGRESS NOTE  Kathleen Haynes YCX:[id number] DOB: 24-May-1927 DOA: 07/10/2018  PCP: [name], MD  Brief History/Interval Summary: 84 y.o. female with medical history significant for Parkinson's disease, dementia, hypertension, hyperlipidemia, protein calorie malnutrition, who was brought to the ED by EMS after her husband became concerned that patient was acting like she was in more pain than usual, moaning and crying more, and also noticed that she seemed to be exhibiting right sided upper extremity weakness and was having trouble sitting upright (per son leans to the left chronically and this was no different today except that she seemed to be leaning more drastically).  Patient was found to have abnormal UA.  There was also concern for an acute stroke.  She was hospitalized for further management.    Reason for Visit: Acute metabolic encephalopathy.  Right-sided hemiparesis and suspected CVA  Consultants: Neurology.  Palliative medicine.  Procedures: None so far  Antibiotics: Ceftriaxone  Subjective/Interval History: Patient does open her eyes and look at me but she is groaning and moaning for the most part.  Does not answer any questions.  Follows a few commands.  Her daughter is at the bedside  ROS: Unable to do due to her mental status  Objective:  Vital Signs  Vitals:   07/10/18 2007 07/10/18 2247 07/11/18 0400 07/11/18 0800  BP: (!) 152/64 (!) 122/59 (!) 121/59 127/69  Pulse: 91 92 97 93  Resp: 20 16 17 18   Temp: 98.2 F (36.8 C) 97.8 F (36.6 C) 98.5 F (36.9 C) 98.7 F (37.1 C)  TempSrc: Axillary Oral Axillary Axillary  SpO2: 97% 96% 95% 97%  Weight: 44.6 kg     Height:        Intake/Output Summary (Last 24 hours) at 07/11/2018 1123 Last data filed at 07/11/2018 4970 Gross per 24 hour  Intake 914.4 ml  Output 100 ml  Net 814.4 ml   Filed Weights   07/10/18 1059 07/10/18 2007  Weight: 48 kg 44.6 kg    General appearance: appears  stated age, distracted and no distress Head: Normocephalic, without obvious abnormality, atraumatic Resp: Normal effort at rest.  Diminished air entry at the bases.  No wheezing rales or rhonchi Cardio: regular rate and rhythm, S1, S2 normal, no murmur, click, rub or gallop GI: soft, non-tender; bowel sounds normal; no masses,  no organomegaly Extremities: extremities normal, atraumatic, no cyanosis or edema.  Good range of motion of the left and right lower extremity without any limitations. Pulses: 2+ and symmetric Neurologic: Somnolent but easily arousable.  Noted to be moaning at times.  Difficult neurological examination.  No obvious facial asymmetry is noted.  Not moving her right side as much as her left.  Lab Results:  Data Reviewed: I have personally reviewed following labs and imaging studies  CBC: Recent Labs  Lab 07/10/18 1031 07/10/18 1035 07/11/18 0421  WBC 7.1  --  6.4  NEUTROABS 3.2  --   --   HGB 14.2 14.3 11.9*  HCT 44.5 42.0 37.7  MCV 94.5  --  92.9  PLT 241  --  263    Basic Metabolic Panel: Recent Labs  Lab 07/10/18 1031 07/10/18 1035 07/11/18 0421  NA 142 143 143  K 3.8 3.9 3.4*  CL 108 107 108  CO2 22  --  22  GLUCOSE 130* 127* 73  BUN 23 25* 25*  CREATININE 1.09* 1.00 1.14*  CALCIUM 9.6  --  9.0    GFR: Estimated Creatinine Clearance:  24 mL/min (A) (by C-G formula based on SCr of 1.14 mg/dL (H)).  Liver Function Tests: Recent Labs  Lab 07/10/18 1031  AST 43*  ALT 17  ALKPHOS 72  BILITOT 0.8  PROT 7.3  ALBUMIN 3.9     Coagulation Profile: Recent Labs  Lab 07/10/18 1031  INR 1.02    HbA1C: Recent Labs    07/11/18 0421  HGBA1C 4.9    CBG: Recent Labs  Lab 07/10/18 1124  GLUCAP 80    Lipid Profile: Recent Labs    07/11/18 0421  CHOL 136  HDL 49  LDLCALC 66  TRIG 107  CHOLHDL 2.8    Thyroid Function Tests: Recent Labs    07/11/18 0421  TSH 2.693     Recent Results (from the past 240 hour(s))  Urine  culture     Status: None (Preliminary result)   Collection Time: 07/10/18  1:42 PM  Result Value Ref Range Status   Specimen Description URINE, CATHETERIZED  Final   Special Requests NONE  Final   Culture   Final    CULTURE REINCUBATED FOR BETTER GROWTH Performed at [facility], [place] [address], [place], [place] [address]    Report Status PENDING  Incomplete      Radiology Studies: Dg Chest 2 View  Result Date: 07/10/2018 CLINICAL DATA:  Pt arrives to ED from home with complaints of right sided weakness, R sided facial droop, and being mute since 1900 11/13. EMS reports that patient has history of parkinson's and dementia EXAM: CHEST - 2 VIEW COMPARISON:  11/30/2015 FINDINGS: Cardiac silhouette is mildly enlarged. No mediastinal or hilar masses. No convincing adenopathy. Lungs are clear.  No pleural effusion or pneumothorax. Skeletal structures are demineralized. There are multiple old healed rib fractures. IMPRESSION: No acute cardiopulmonary disease. Electronically Signed   By: Lajean Manes M.D.   On: 07/10/2018 13:18   Ct Head Code Stroke Wo Contrast  Result Date: 07/10/2018 CLINICAL DATA:  Code stroke. Right-sided weakness. Altered level of consciousness. EXAM: CT HEAD WITHOUT CONTRAST TECHNIQUE: Contiguous axial images were obtained from the base of the skull through the vertex without intravenous contrast. COMPARISON:  CT head 11/30/2015 FINDINGS: Brain: Moderate atrophy. Chronic encephalomalacia inferior frontal lobe bilaterally is unchanged. Patchy white matter hypodensity unchanged. Chronic infarct in the right cerebellum unchanged. Negative for acute infarct, hemorrhage, or mass. Vascular: Negative for hyperdense vessel Skull: No focal skull lesion. Chronic fracture of the dens. Progressive erosion of the tip of the dens with progressive basilar invagination. Progressive stenosis at the foramen magnum. Sinuses/Orbits: Paranasal sinuses clear. Bilateral cataract surgery. Other:  None ASPECTS ([place] Stroke Program Early CT Score) - Ganglionic level infarction (caudate, lentiform nuclei, internal capsule, insula, M1-M3 cortex): 7 - Supraganglionic infarction (M4-M6 cortex): 3 Total score (0-10 with 10 being normal): 10 IMPRESSION: 1. No acute intracranial abnormality 2. Atrophy and chronic ischemic changes are stable from the prior CT. 3. Chronic fracture of the dens with progressive erosion of the dens and progressive basilar invagination. Moderate stenosis at the foramen magnum. 4. ASPECTS is 10 Electronically Signed   By: Franchot Gallo M.D.   On: 07/10/2018 10:47     Medications:  Scheduled: .  stroke: mapping our early stages of recovery book   Does not apply Once  . aspirin  81 mg Oral Daily  . carbidopa-levodopa  1 tablet Oral BID  . darifenacin  7.5 mg Oral Daily  . donepezil  5 mg Oral QHS  . enoxaparin (LOVENOX)  injection  30 mg Subcutaneous Q24H  . memantine  14 mg Oral BH-q7a  . polyethylene glycol  17 g Oral Once per day on Mon Thu  . potassium chloride SA  20 mEq Oral Daily  . pravastatin  80 mg Oral BH-q7a   Continuous: . sodium chloride    . sodium chloride 50 mL/hr at 07/11/18 0914  . cefTRIAXone (ROCEPHIN)  IV     [id number] **OR** acetaminophen (TYLENOL) oral liquid 160 mg/5 mL **OR** acetaminophen, senna-docusate  Assessment/Plan:    Right-sided weakness/suspected acute CVA CT scan did not show any acute findings.  It appears that patient cannot get MRI as she reportedly has some metal implants in her ear.  Neurology is following.  Will defer to them as to further neurological testing.  Continue with aspirin.  PT and OT evaluation.  Speech therapy to see.  LDL 66.  HbA1c 4.9.  TSH 2.69.  Echocardiogram.  Acute cystitis UA noted to be abnormal.  Patient started on ceftriaxone.  Follow-up urine cultures.  Essential hypertension Allowing permissive hypertension for now.  History of Parkinson's disease with dementia Patient  apparently has poor functioning at baseline.  She is for the most part bedridden but does get around in a wheelchair with assistance from her husband.  She is on Sinemet Aricept and memantine which is being continued.  Hyperlipidemia LDL 66.  Continue statin.  Goals of care Patient appears to have a poor functioning at baseline with possibly poor quality of life.  She has chronic neurological disorders which are progressive in nature.  Palliative medicine has been consulted for goals of care conversation.   DVT Prophylaxis: Lovenox    Code Status: DNR Family Communication: Discussed with the patient's daughter Disposition Plan: Management as outlined above.    LOS: 1 day   [facility] Pager [phone] 07/11/2018, 11:23 AM  If 7PM-7AM, please contact night-coverage at [email or web address], password [facility]

## 2018-07-11 NOTE — Plan of Care (Signed)
  Problem: Clinical Measurements: Goal: Respiratory complications will improve Outcome: Progressing   Problem: Activity: Goal: Risk for activity intolerance will decrease Outcome: Progressing   Problem: Elimination: Goal: Will not experience complications related to urinary retention Outcome: Progressing   Problem: Pain Managment: Goal: General experience of comfort will improve Outcome: Progressing

## 2018-07-12 ENCOUNTER — Inpatient Hospital Stay (HOSPITAL_COMMUNITY): Payer: Medicare Other

## 2018-07-12 DIAGNOSIS — I351 Nonrheumatic aortic (valve) insufficiency: Secondary | ICD-10-CM

## 2018-07-12 DIAGNOSIS — E162 Hypoglycemia, unspecified: Secondary | ICD-10-CM

## 2018-07-12 DIAGNOSIS — N390 Urinary tract infection, site not specified: Secondary | ICD-10-CM

## 2018-07-12 DIAGNOSIS — G9340 Encephalopathy, unspecified: Secondary | ICD-10-CM

## 2018-07-12 LAB — GLUCOSE, CAPILLARY
Glucose-Capillary: 107 mg/dL — ABNORMAL HIGH (ref 70–99)
Glucose-Capillary: 84 mg/dL (ref 70–99)
Glucose-Capillary: 60 mg/dL — ABNORMAL LOW (ref 70–99)
Glucose-Capillary: 105 mg/dL — ABNORMAL HIGH (ref 70–99)

## 2018-07-12 LAB — BASIC METABOLIC PANEL
ANION GAP: 12 (ref 5–15)
BUN: 20 mg/dL (ref 8–23)
CALCIUM: 8.7 mg/dL — AB (ref 8.9–10.3)
CO2: 19 mmol/L — ABNORMAL LOW (ref 22–32)
Chloride: 109 mmol/L (ref 98–111)
Creatinine, Ser: 1.15 mg/dL — ABNORMAL HIGH (ref 0.44–1.00)
GFR, EST AFRICAN AMERICAN: 48 mL/min — AB (ref >60)
GFR, EST NON AFRICAN AMERICAN: 41 mL/min — AB (ref >60)
Glucose, Bld: 58 mg/dL — ABNORMAL LOW (ref 70–99)
POTASSIUM: 3.5 mmol/L (ref 3.5–5.1)
SODIUM: 140 mmol/L (ref 135–145)

## 2018-07-12 LAB — ECHOCARDIOGRAM COMPLETE
HEIGHTINCHES: 62 in
Weight: 1573.2 oz

## 2018-07-12 LAB — URINE CULTURE: Culture: >=100000 — AB

## 2018-07-12 MED ORDER — DEXTROSE 50 % IV SOLN
25.0000 mL | Freq: Once | INTRAVENOUS | Status: AC
  Administered 2018-07-12: 25 mL via INTRAVENOUS
  Filled 2018-07-12: qty 50

## 2018-07-12 NOTE — Progress Notes (Signed)
Examined patient and chart.  Noted CBG of 53 early this am.    Spoke with Dtr at bedside for approximately 35 min. (Husband was not present).  Dtr expressed that her father will need additional support in the home.  When PT/OT came out after the last hospitalization they signed off as they did not feel she could participate.  Consequently dtr feels home health PT/OT would not be beneficial.  Dtr would welcome Hospice support.  She has a friend who used to work for hospice.   However, Dtr explained that patient's husband fears hospice as he feels engaging them means end of life is imminent.    We discussed in detail what Hospice at home offers.  Dtr stated she will encourage her father to accept hospice services.     Discussed reducing medications - particularly namenda and aricept that are not of benefit to her mother any longer - and may be of some detriment.  We also discussed mother's current condition as compared to her PTA condition.  Dtr acknowledges that her mother is getting closer to EOL.    PE: Awake, non-verbal, chronically ill appearing female. CV rrr resp no distress Abdomen soft thin Skin dry with multiple bruises on upper ext.  Assessment: 84 yo female with a long history of parkinson's dementia and fractured neck Now with new deficits most likely caused by stroke.    Prognosis:  Less than 6 months  Recommendations: 1.  Progress diet as tolerated. 2.  Encouraged Home with Hospice - patient is hospice eligible. 3.  If patient continues to experience hypoglycemia may be nearing EOL more quickly and hospice house would be appropriate.   Please call PMT office if further assistance is needed over the weekend.  Florentina Jenny, PA-C Palliative Medicine Pager: [phone]  Total time 35 min.

## 2018-07-12 NOTE — Progress Notes (Signed)
PT Cancellation Note  Patient Details Name: AIKO BELKO MRN: [id number] DOB: 1927/05/18   Cancelled Treatment:    Reason Eval/Treat Not Completed: Medical issues which prohibited therapy Per chart review, she continues to have active bed rest order and blood glucose 58 this morning.   Have secure messaged attending MD regarding bed rest order but will need to wait until blood glucose levels are within appropriate range to participate in PT.   Will attempt to try back if time/schedule allow, otherwise will attempt on next day of service.    Deniece Ree PT, DPT, CBIS  Supplemental Physical Therapist [facility]    Pager [phone] Acute Rehab Office [phone]

## 2018-07-12 NOTE — Progress Notes (Signed)
  Echocardiogram 2D Echocardiogram has been performed.  Merrie Roof F 07/12/2018, 2:37 PM

## 2018-07-12 NOTE — Progress Notes (Signed)
[facility] PROGRESS NOTE  Kathleen Haynes YQM:[id number] DOB: 05-31-1927 DOA: 07/10/2018  PCP: [name], MD  Brief History/Interval Summary: 84 y.o. female with medical history significant for Parkinson's disease, dementia, hypertension, hyperlipidemia, protein calorie malnutrition, who was brought to the ED by EMS after her husband became concerned that patient was acting like she was in more pain than usual, moaning and crying more, and also noticed that she seemed to be exhibiting right sided upper extremity weakness and was having trouble sitting upright (per son leans to the left chronically and this was no different today except that she seemed to be leaning more drastically).  Patient was found to have abnormal UA.  There was also concern for an acute stroke.  She was hospitalized for further management.    Reason for Visit: Acute metabolic encephalopathy.  Right-sided hemiparesis and suspected CVA  Consultants: Neurology.  Palliative medicine.  Procedures:   Transthoracic echocardiogram is pending  Antibiotics: Ceftriaxone  Subjective/Interval History: Patient remains lethargic.  Does open her eyes.  Blood glucose level noted to be low this morning.  Does not communicate.  No family at bedside this morning.  ROS: Unable to do due to her mental status  Objective:  Vital Signs  Vitals:   07/11/18 1946 07/12/18 0014 07/12/18 0354 07/12/18 0748  BP: 121/69 113/61 112/64 (!) 149/70  Pulse: 91 86 97 97  Resp: 16 18 17 20   Temp: 97.7 F (36.5 C) 98.3 F (36.8 C) 98 F (36.7 C) 98.5 F (36.9 C)  TempSrc: Oral Axillary Axillary Axillary  SpO2: 96% 97% 95% 96%  Weight:      Height:        Intake/Output Summary (Last 24 hours) at 07/12/2018 1023 Last data filed at 07/12/2018 0357 Gross per 24 hour  Intake -  Output 500 ml  Net -500 ml   Filed Weights   07/10/18 1059 07/10/18 2007  Weight: 48 kg 44.6 kg    General appearance: Somnolent but arousable.   Not very communicative. Resp: Noted to have normal effort at rest.  Diminished air entry at the bases.  No wheezing rales or rhonchi Cardio: S1-S2 is normal regular.  No S3-S4.  No rubs murmurs or bruit GI:.  Does not appear to be tender.  No masses organomegaly.  Examination is limited as the patient does not fully cooperate. Extremities: Moving her extremities Neurologic: Somnolent but arousable.  Eyes open.  Moving her left side more than the right side.    Lab Results:  Data Reviewed: I have personally reviewed following labs and imaging studies  CBC: Recent Labs  Lab 07/10/18 1031 07/10/18 1035 07/11/18 0421  WBC 7.1  --  6.4  NEUTROABS 3.2  --   --   HGB 14.2 14.3 11.9*  HCT 44.5 42.0 37.7  MCV 94.5  --  92.9  PLT 241  --  528    Basic Metabolic Panel: Recent Labs  Lab 07/10/18 1031 07/10/18 1035 07/11/18 0421 07/12/18 0342  NA 142 143 143 140  K 3.8 3.9 3.4* 3.5  CL 108 107 108 109  CO2 22  --  22 19*  GLUCOSE 130* 127* 73 58*  BUN 23 25* 25* 20  CREATININE 1.09* 1.00 1.14* 1.15*  CALCIUM 9.6  --  9.0 8.7*    GFR: Estimated Creatinine Clearance: 23.8 mL/min (A) (by C-G formula based on SCr of 1.15 mg/dL (H)).  Liver Function Tests: Recent Labs  Lab 07/10/18 1031  AST 43*  ALT  17  ALKPHOS 72  BILITOT 0.8  PROT 7.3  ALBUMIN 3.9     Coagulation Profile: Recent Labs  Lab 07/10/18 1031  INR 1.02    HbA1C: Recent Labs    07/11/18 0421  HGBA1C 4.9    CBG: Recent Labs  Lab 07/10/18 1124 07/12/18 0841  GLUCAP 80 107*    Lipid Profile: Recent Labs    07/11/18 0421  CHOL 136  HDL 49  LDLCALC 66  TRIG 107  CHOLHDL 2.8    Thyroid Function Tests: Recent Labs    07/11/18 0421  TSH 2.693     Recent Results (from the past 240 hour(s))  Urine culture     Status: Abnormal   Collection Time: 07/10/18  1:42 PM  Result Value Ref Range Status   Specimen Description URINE, CATHETERIZED  Final   Special Requests   Final     NONE Performed at [facility], [place] [address] [place], [place] [address]    Culture (A)  Final    >=100,000 COLONIES/mL MULTIPLE SPECIES PRESENT, SUGGEST RECOLLECTION   Report Status 07/12/2018 FINAL  Final      Radiology Studies: Ct Angio Head W Or Wo Contrast  Result Date: 07/11/2018 CLINICAL DATA:  Followup code stroke. Dementia. Parkinson's disease. Right-sided upper extremity weakness. EXAM: CT ANGIOGRAPHY HEAD AND NECK TECHNIQUE: Multidetector CT imaging of the head and neck was performed using the standard protocol during bolus administration of intravenous contrast. Multiplanar CT image reconstructions and MIPs were obtained to evaluate the vascular anatomy. Carotid stenosis measurements (when applicable) are obtained utilizing NASCET criteria, using the distal internal carotid diameter as the denominator. CONTRAST:  165mL ISOVUE-370 IOPAMIDOL (ISOVUE-370) INJECTION 76% COMPARISON:  Head CT yesterday FINDINGS: CT HEAD FINDINGS Brain: No appreciable change since yesterday. Generalized atrophy. Old inferior cerebellar infarction on the right. Old right frontal cortical and subcortical infarction. Chronic small-vessel ischemic changes of the white matter. Vascular: There is atherosclerotic calcification of the major vessels at the base of the brain. Skull: Negative Sinuses: Clear Orbits: Negative Review of the MIP images confirms the above findings CTA NECK FINDINGS Aortic arch: Aortic atherosclerosis. Branching pattern is normal without flow limiting origin stenosis. 30% narrowing of the left subclavian origin. Right carotid system: Common carotid artery is tortuous but widely patent to the bifurcation. There is atherosclerotic plaque at the carotid bifurcation and ICA bulb. Minimal diameter of the proximal ICA is 3 mm. Compared to a more distal cervical ICA diameter of 4 mm, this indicates a 25% stenosis. Left carotid system: Common carotid artery is widely patent to the bifurcation.  There is calcified plaque at the carotid bifurcation and ICA bulb. Minimal diameter of the ICA bulb is 4 mm. Compared to a more distal cervical ICA diameter of 4 mm, there is no stenosis. Vertebral arteries: Calcified plaque at the right vertebral artery origin but without origin stenosis. Beyond that, the vessel becomes narrow and appears to be occluded in the lower cervical region. The left vertebral artery shows wide patency at its origin. The vessel is patent through the cervical region to the foramen magnum. The distal right vertebral artery is reconstituted by cervical collaterals at the C1 level as a robust vessel. There is 50% stenosis as the vessel penetrates the dura at the foramen magnum. Skeleton: Mild degenerative spondylosis. 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 This is unchanged from previous studies. Other neck: No mass or lymphadenopathy. Upper chest: Normal Review of the MIP  images confirms the above findings CTA HEAD FINDINGS Anterior circulation: Both internal carotid arteries are widely patent through the skull base and siphon regions. No stenosis. The anterior and middle cerebral vessels are patent without evidence of proximal stenosis or aneurysm. No occluded branch vessels are identified. Posterior circulation: Both vertebral arteries contribute to the basilar. No basilar stenosis. Posterior circulation branch vessels are patent. Venous sinuses: Patent and normal. Anatomic variants: None significant. Delayed phase: No abnormal enhancement. Review of the MIP images confirms the above findings IMPRESSION: No acute vascular finding. Atherosclerotic change at both carotid bifurcations and ICA bulbs. Despite the presence of atherosclerosis, there is no flow limiting stenosis. 25% narrowing of the ICA bulb on the right. No stenosis on the left. No intracranial anterior circulation large or medium vessel abnormality. Dominant left vertebral artery widely  patent. Non dominant right vertebral artery is occluded in the lower cervical region and reconstituted in the upper cervical region by cervical collaterals. No intracranial posterior circulation large or medium vessel abnormality presently. Electronically Signed   By: Nelson Chimes M.D.   On: 07/11/2018 14:17   Dg Chest 2 View  Result Date: 07/10/2018 CLINICAL DATA:  Pt arrives to ED from home with complaints of right sided weakness, R sided facial droop, and being mute since 1900 11/13. EMS reports that patient has history of parkinson's and dementia EXAM: CHEST - 2 VIEW COMPARISON:  11/30/2015 FINDINGS: Cardiac silhouette is mildly enlarged. No mediastinal or hilar masses. No convincing adenopathy. Lungs are clear.  No pleural effusion or pneumothorax. Skeletal structures are demineralized. There are multiple old healed rib fractures. IMPRESSION: No acute cardiopulmonary disease. Electronically Signed   By: Lajean Manes M.D.   On: 07/10/2018 13:18   Ct Angio Neck W Or Wo Contrast  Result Date: 07/11/2018 CLINICAL DATA:  Followup code stroke. Dementia. Parkinson's disease. Right-sided upper extremity weakness. EXAM: CT ANGIOGRAPHY HEAD AND NECK TECHNIQUE: Multidetector CT imaging of the head and neck was performed using the standard protocol during bolus administration of intravenous contrast. Multiplanar CT image reconstructions and MIPs were obtained to evaluate the vascular anatomy. Carotid stenosis measurements (when applicable) are obtained utilizing NASCET criteria, using the distal internal carotid diameter as the denominator. CONTRAST:  162mL ISOVUE-370 IOPAMIDOL (ISOVUE-370) INJECTION 76% COMPARISON:  Head CT yesterday FINDINGS: CT HEAD FINDINGS Brain: No appreciable change since yesterday. Generalized atrophy. Old inferior cerebellar infarction on the right. Old right frontal cortical and subcortical infarction. Chronic small-vessel ischemic changes of the white matter. Vascular: There is  atherosclerotic calcification of the major vessels at the base of the brain. Skull: Negative Sinuses: Clear Orbits: Negative Review of the MIP images confirms the above findings CTA NECK FINDINGS Aortic arch: Aortic atherosclerosis. Branching pattern is normal without flow limiting origin stenosis. 30% narrowing of the left subclavian origin. Right carotid system: Common carotid artery is tortuous but widely patent to the bifurcation. There is atherosclerotic plaque at the carotid bifurcation and ICA bulb. Minimal diameter of the proximal ICA is 3 mm. Compared to a more distal cervical ICA diameter of 4 mm, this indicates a 25% stenosis. Left carotid system: Common carotid artery is widely patent to the bifurcation. There is calcified plaque at the carotid bifurcation and ICA bulb. Minimal diameter of the ICA bulb is 4 mm. Compared to a more distal cervical ICA diameter of 4 mm, there is no stenosis. Vertebral arteries: Calcified plaque at the right vertebral artery origin but without origin stenosis. Beyond that, the vessel becomes narrow and appears  to be occluded in the lower cervical region. The left vertebral artery shows wide patency at its origin. The vessel is patent through the cervical region to the foramen magnum. The distal right vertebral artery is reconstituted by cervical collaterals at the C1 level as a robust vessel. There is 50% stenosis as the vessel penetrates the dura at the foramen magnum. Skeleton: Mild degenerative spondylosis. 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 This is unchanged from previous studies. Other neck: No mass or lymphadenopathy. Upper chest: Normal Review of the MIP images confirms the above findings CTA HEAD FINDINGS Anterior circulation: Both internal carotid arteries are widely patent through the skull base and siphon regions. No stenosis. The anterior and middle cerebral vessels are patent without evidence of proximal stenosis or  aneurysm. No occluded branch vessels are identified. Posterior circulation: Both vertebral arteries contribute to the basilar. No basilar stenosis. Posterior circulation branch vessels are patent. Venous sinuses: Patent and normal. Anatomic variants: None significant. Delayed phase: No abnormal enhancement. Review of the MIP images confirms the above findings IMPRESSION: No acute vascular finding. Atherosclerotic change at both carotid bifurcations and ICA bulbs. Despite the presence of atherosclerosis, there is no flow limiting stenosis. 25% narrowing of the ICA bulb on the right. No stenosis on the left. No intracranial anterior circulation large or medium vessel abnormality. Dominant left vertebral artery widely patent. Non dominant right vertebral artery is occluded in the lower cervical region and reconstituted in the upper cervical region by cervical collaterals. No intracranial posterior circulation large or medium vessel abnormality presently. Electronically Signed   By: Nelson Chimes M.D.   On: 07/11/2018 14:17   Ct Head Code Stroke Wo Contrast  Result Date: 07/10/2018 CLINICAL DATA:  Code stroke. Right-sided weakness. Altered level of consciousness. EXAM: CT HEAD WITHOUT CONTRAST TECHNIQUE: Contiguous axial images were obtained from the base of the skull through the vertex without intravenous contrast. COMPARISON:  CT head 11/30/2015 FINDINGS: Brain: Moderate atrophy. Chronic encephalomalacia inferior frontal lobe bilaterally is unchanged. Patchy white matter hypodensity unchanged. Chronic infarct in the right cerebellum unchanged. Negative for acute infarct, hemorrhage, or mass. Vascular: Negative for hyperdense vessel Skull: No focal skull lesion. Chronic fracture of the dens. Progressive erosion of the tip of the dens with progressive basilar invagination. Progressive stenosis at the foramen magnum. Sinuses/Orbits: Paranasal sinuses clear. Bilateral cataract surgery. Other: None ASPECTS ([place]  Stroke Program Early CT Score) - Ganglionic level infarction (caudate, lentiform nuclei, internal capsule, insula, M1-M3 cortex): 7 - Supraganglionic infarction (M4-M6 cortex): 3 Total score (0-10 with 10 being normal): 10 IMPRESSION: 1. No acute intracranial abnormality 2. Atrophy and chronic ischemic changes are stable from the prior CT. 3. Chronic fracture of the dens with progressive erosion of the dens and progressive basilar invagination. Moderate stenosis at the foramen magnum. 4. ASPECTS is 10 Electronically Signed   By: Franchot Gallo M.D.   On: 07/10/2018 10:47   Vas US Carotid  Result Date: 07/11/2018 Carotid Arterial Duplex Study Indications:   CVA and Weakness. Right arm Risk Factors:  Hypertension, hyperlipidemia. Other Factors: Parkinsons, dementia. Performing Technologist: Toma Copier RVS  Examination Guidelines: A complete evaluation includes B-mode imaging, spectral Doppler, color Doppler, and power Doppler as needed of all accessible portions of each vessel. Bilateral testing is considered an integral part of a complete examination. Limited examinations for reoccurring indications may be performed as noted.  Right Carotid Findings: +----------+--------+--------+--------+--------------------+-------------------+           PSV  cm/sEDV cm/sStenosisDescribe            Comments            +----------+--------+--------+--------+--------------------+-------------------+ CCA Prox  87      17                                  mild intimal                                                              changes             +----------+--------+--------+--------+--------------------+-------------------+ CCA Distal57      13                                  mild intimal                                                              changes             +----------+--------+--------+--------+--------------------+-------------------+ ICA Prox  49      15               diffuse and         mild plaque                                           heterogenous                            +----------+--------+--------+--------+--------------------+-------------------+ ICA Mid   58      15                                                      +----------+--------+--------+--------+--------------------+-------------------+ ICA Distal86      28                                  tortuous            +----------+--------+--------+--------+--------------------+-------------------+ ECA       46      2                                   intimal thickening  +----------+--------+--------+--------+--------------------+-------------------+ +----------+--------+-------+--------+-------------------+           PSV cm/sEDV cmsDescribeArm Pressure (mmHG) +----------+--------+-------+--------+-------------------+ [id number]     0                                  +----------+--------+-------+--------+-------------------+ +---------+--------+--+--------+-+  VertebralPSV cm/s20EDV cm/s4 +---------+--------+--+--------+-+  Left Carotid Findings: +----------+--------+--------+--------+------------+--------------------+           PSV cm/sEDV cm/sStenosisDescribe    Comments             +----------+--------+--------+--------+------------+--------------------+ CCA Prox  39      8                           mild intimal changes +----------+--------+--------+--------+------------+--------------------+ CCA Distal50      9                           mild intimal changes +----------+--------+--------+--------+------------+--------------------+ ICA Prox  56      15              heterogenousmild plaque          +----------+--------+--------+--------+------------+--------------------+ ICA Mid   112     36              heterogenousmild plaque          +----------+--------+--------+--------+------------+--------------------+ ICA Distal70       23                          tortuous             +----------+--------+--------+--------+------------+--------------------+ ECA       46      7               heterogenousmild plaque          +----------+--------+--------+--------+------------+--------------------+ +----------+--------+--------+--------+-------------------+ SubclavianPSV cm/sEDV cm/sDescribeArm Pressure (mmHG) +----------+--------+--------+--------+-------------------+           79                                          +----------+--------+--------+--------+-------------------+ +---------+--------+--+--------+-+ VertebralPSV cm/s29EDV cm/s8 +---------+--------+--+--------+-+  Summary: Right Carotid: Velocities in the right ICA are consistent with a 1-39% stenosis. Left Carotid: Velocities in the left ICA are consistent with a 1-39% stenosis. Vertebrals:  Bilateral vertebral arteries demonstrate antegrade flow. Subclavians: Normal flow hemodynamics were seen in bilateral subclavian              arteries. *See table(s) above for measurements and observations.  Electronically signed by Antony Contras MD on 07/11/2018 at 2:47:51 PM.    Final      Medications:  Scheduled: .  stroke: mapping our early stages of recovery book   Does not apply Once  . aspirin  81 mg Oral Daily  . carbidopa-levodopa  1 tablet Oral BID  . darifenacin  7.5 mg Oral Daily  . donepezil  5 mg Oral QHS  . enoxaparin (LOVENOX) injection  30 mg Subcutaneous Q24H  . memantine  14 mg Oral BH-q7a  . polyethylene glycol  17 g Oral Once per day on Mon Thu  . potassium chloride SA  20 mEq Oral Daily  . pravastatin  80 mg Oral BH-q7a   Continuous: . sodium chloride    . sodium chloride 50 mL/hr at 07/12/18 0023  . cefTRIAXone (ROCEPHIN)  IV 1 g (07/11/18 1520)   TDD:[id number] **OR** acetaminophen (TYLENOL) oral liquid 160 mg/5 mL **OR** acetaminophen, senna-docusate    Assessment/Plan:  Right-sided weakness/suspected acute CVA CT  scan did not show any acute findings.  It appears that patient cannot get MRI as she reportedly has some  metal implants in her ear.  Neurology was consulted.  Patient underwent CT angiogram head and neck which did not show any acute findings.  Echocardiogram is pending.  No significant stenosis on carotid Dopplers.  Patient on aspirin.  PT and OT evaluation.  Speech therapy evaluation.  LDL is 66.  HbA1c 4.9.  TSH 2.69.  Patient has multiple underlying health issues with poor quality of life at baseline and poor functioning at baseline.  Palliative medicine is following.  Neurology has no further recommendation has signed off.    Acute cystitis UA noted to be abnormal.  Patient started on ceftriaxone.  Urine cultures with multiple species.  Leave her on ceftriaxone for another 24 hours.  Hypoglycemia Not on any glucose lowering agents.  This is most likely due to poor oral intake.  Give her half an ampule of D50 this morning.  Recheck CBGs.  Encourage oral intake.   Essential hypertension Blood pressure has been reasonably well controlled.  Currently not on any blood pressure lowering agents.  History of Parkinson's disease with dementia Patient apparently has poor functioning at baseline.  She is for the most part bedridden but does get around in a wheelchair with assistance from her husband.  She is on Sinemet Aricept and memantine which is being continued.  Mental status same as yesterday.  Hyperlipidemia LDL 66.  Continue statin.  Goals of care Patient appears to have a poor functioning at baseline with possibly poor quality of life.  She has chronic neurological disorders which are progressive in nature.  Palliative medicine has been consulted for goals of care conversation.  Further discussions based on patient's clinical status over the next 24 hours.  If she does not improve then I think pursuing hospice may be reasonable.  DVT Prophylaxis: Lovenox    Code Status: DNR Family  Communication: No family at bedside Disposition Plan: Management as outlined above.    LOS: 2 days   [facility] Pager [phone] 07/12/2018, 10:23 AM  If 7PM-7AM, please contact night-coverage at [email or web address], password [facility]

## 2018-07-12 NOTE — Progress Notes (Signed)
Physical Therapy Treatment Patient Details Name: Kathleen Haynes MRN: [id number] DOB: 07/05/1927 Today's Date: 07/12/2018    History of Present Illness pt is a n 84 y/o female with h/o Parkinson's ds, dementia, HTN, admitted to the ED with concerns of appearing to be in more pain than usual with more moaning and crying.  Furthermore, pt looked to have more right UE weakness and even more trouble sitting upright.    PT Comments    Pt admitted with/for above stated symptoms.  Pt is fairly dependent in care now, but pt's husband describes pt as able to do a bit more.  .  Pt currently limited functionally due to the problems listed below.  (see problems list.)  Pt will benefit from PT to maximize function and safety to be able to get home safely with available assist.    Follow Up Recommendations  Home health PT;Other (comment)(trial and caregive education)     Equipment Recommendations  None recommended by PT    Recommendations for Other Services       Precautions / Restrictions Precautions Precautions: Fall    Mobility  Bed Mobility Overal bed mobility: Needs Assistance Bed Mobility: Rolling;Sidelying to Sit Rolling: Max assist Sidelying to sit: Total assist       General bed mobility comments: pt did not attempt to assist with either UE and needed total truncal assist  Transfers Overall transfer level: Needs assistance   Transfers: Sit to/from Stand Sit to Stand: Total assist         General transfer comment: knees blocked due to legs not accepting full weight.  Ambulation/Gait                 Stairs             Wheelchair Mobility    Modified Rankin (Stroke Patients Only)       Balance Overall balance assessment: Needs assistance Sitting-balance support: No upper extremity supported;Feet supported Sitting balance-Leahy Scale: Poor Sitting balance - Comments: consistent list to the left with not extremity or truncal reaction to correct      Standing balance-Leahy Scale: Zero                              Cognition Arousal/Alertness: Awake/alert;Lethargic Behavior During Therapy: (crying, ?PBA) Overall Cognitive Status: History of cognitive impairments - at baseline                                        Exercises      General Comments        Pertinent Vitals/Pain Pain Assessment: Faces Faces Pain Scale: Hurts even more Pain Location: assuming neck per husband, but cried with LE movement.   Pain Descriptors / Indicators: Crying;Moaning Pain Intervention(s): Monitored during session;Repositioned    Home Living Family/patient expects to be discharged to:: Private residence Living Arrangements: Spouse/significant other Available Help at Discharge: Family;Available 24 hours/day Type of Home: House Home Access: Stairs to enter;Ramped entrance   Home Layout: One level Home Equipment: Wheelchair - manual;Bedside commode;Shower seat      Prior Function Level of Independence: Needs assistance  Gait / Transfers Assistance Needed: non ambulatory, unable to maueuver the W/C in a coordinated manner.  transfers with total to max assist. ADL's / Homemaking Assistance Needed: husband assist's toileting (BSC days and "depends" at night), sponge baths daily,  1x/week shower.  Pt brushes her teeth after setup and wipes face and ?privates to cuing.     PT Goals (current goals can now be found in the care plan section) Acute Rehab PT Goals Patient Stated Goal: get her assisting me more. PT Goal Formulation: With family Time For Goal Achievement: 07/26/18 Potential to Achieve Goals: Fair    Frequency    Min 3X/week      PT Plan      Co-evaluation              AM-PAC PT "6 Clicks" Daily Activity  Outcome Measure  Difficulty turning over in bed (including adjusting bedclothes, sheets and blankets)?: Unable Difficulty moving from lying on back to sitting on the side of the bed? :  Unable Difficulty sitting down on and standing up from a chair with arms (e.g., wheelchair, bedside commode, etc,.)?: Unable Help needed moving to and from a bed to chair (including a wheelchair)?: Total Help needed walking in hospital room?: Total Help needed climbing 3-5 steps with a railing? : Total 6 Click Score: 6    End of Session   Activity Tolerance: Patient tolerated treatment well Patient left: in chair;with call bell/phone within reach;with chair alarm set;with family/visitor present Nurse Communication: Mobility status PT Visit Diagnosis: Muscle weakness (generalized) (M62.81);Other abnormalities of gait and mobility (R26.89);Adult, failure to thrive (R62.7)     Time: 0211-1552 PT Time Calculation (min) (ACUTE ONLY): 37 min  Charges:  $Therapeutic Activity: 8-22 mins                     07/12/2018  Donnella Sham, PT Acute Rehabilitation Services [phone]  (pager) [phone]  (office)   Tessie Fass Raad Clayson 07/12/2018, 12:01 PM

## 2018-07-12 NOTE — Progress Notes (Deleted)
Visited patient at bedside.  She is sleeping and appears comfortable.   Left information for husband regarding pros/cons of artificial feeding vs hand feeding in patients with advanced dementia.  PMT will continue to follow with you.    Florentina Jenny, PA-C Palliative Medicine Pager: [phone]   No charge.

## 2018-07-12 NOTE — Evaluation (Signed)
Occupational Therapy Evaluation Patient Details Name: Kathleen Haynes MRN: [id number] DOB: [birth year] Today's Date: 07/12/2018    History of Present Illness pt is a n 84 y/o female with h/o Parkinson's ds, dementia, HTN, admitted to the ED with concerns of appearing to be in more pain than usual with more moaning and crying.  Furthermore, pt looked to have more right UE weakness and even more trouble sitting upright.   Clinical Impression   Pt admitted with the above diagnoses and presents with below problem list. Pt will benefit from continued acute OT to address the below listed deficits and maximize independence with basic ADLs. PTA pt was needing significant assist of spouse for basic ADLs and functional transfers, likely max A. Pt is currently total A with all ADLs and squat-pivot transfer. Sister present and reports that at baseline pt is able to give more assistance with transfers and is currently not at baseline with ADLs.    Follow Up Recommendations  Home health OT;Supervision/Assistance - 24 hour    Equipment Recommendations  None recommended by OT    Recommendations for Other Services       Precautions / Restrictions Precautions Precautions: Fall Restrictions Weight Bearing Restrictions: No      Mobility Bed Mobility Overal bed mobility: Needs Assistance Bed Mobility: Sit to Supine Rolling: Max assist Sidelying to sit: Total assist   Sit to supine: Total assist;HOB elevated   General bed mobility comments: Total A to return to supine position, +2 to scoot up in bed.   Transfers Overall transfer level: Needs assistance   Transfers: Squat Pivot Transfers Sit to Stand: Total assist   Squat pivot transfers: Total assist     General transfer comment: knees blocked due to legs not accepting full weight. Utilized gait belt and bed pad under pt.    Balance Overall balance assessment: Needs assistance Sitting-balance support: No upper extremity supported;Feet  supported Sitting balance-Leahy Scale: Poor Sitting balance - Comments: consistent list to the left with not extremity or truncal reaction to correct     Standing balance-Leahy Scale: Zero                             ADL either performed or assessed with clinical judgement   ADL Overall ADL's : Needs assistance/impaired Eating/Feeding: Total assistance;Sitting   Grooming: Total assistance   Upper Body Bathing: Total assistance   Lower Body Bathing: Total assistance   Upper Body Dressing : Total assistance   Lower Body Dressing: Total assistance   Toilet Transfer: Total assistance;Squat-pivot   Toileting- Clothing Manipulation and Hygiene: Total assistance         General ADL Comments: Total +1 to squat-pivot from recliner to bed.  Total to return to supine position, +2 to scoot up in bed.     Vision         Perception     Praxis      Pertinent Vitals/Pain Pain Assessment: Faces Faces Pain Scale: Hurts even more Pain Location: assuming neck per sister Pain Descriptors / Indicators: Crying;Moaning Pain Intervention(s): Monitored during session;Limited activity within patient's tolerance;Repositioned     Hand Dominance Right   Extremity/Trunk Assessment Upper Extremity Assessment Upper Extremity Assessment: Generalized weakness;Difficult to assess due to impaired cognition(tends to hold BUE in shoulder IR and RUE in flexion synergy)   Lower Extremity Assessment Lower Extremity Assessment: Defer to PT evaluation RLE Deficits / Details: moves spontaneously.  leg held in flexion throughout  the evaluation  Pt unable to follow commands for MMT LLE Deficits / Details: see R LE comments   Cervical / Trunk Assessment Cervical / Trunk Assessment: Kyphotic   Communication Communication Communication: Other (comment)(non verbal on eval)   Cognition Arousal/Alertness: Awake/alert;Lethargic Behavior During Therapy: Anxious;Flat affect;WFL for tasks  assessed/performed Overall Cognitive Status: History of cognitive impairments - at baseline                                     General Comments  Sister reports pt's spouse is bringing neck brace from home.     Exercises     Shoulder Instructions      Home Living Family/patient expects to be discharged to:: Private residence Living Arrangements: Spouse/significant other Available Help at Discharge: Family;Available 24 hours/day Type of Home: House Home Access: Stairs to enter;Ramped entrance     Home Layout: One level     Bathroom Shower/Tub: Occupational psychologist: Standard     Home Equipment: Wheelchair - manual;Bedside commode;Shower seat          Prior Functioning/Environment Level of Independence: Needs assistance  Gait / Transfers Assistance Needed: non ambulatory, unable to maueuver the W/C in a coordinated manner.  transfers with total to max assist. ADL's / Homemaking Assistance Needed: husband assist's toileting (BSC days and "depends" at night), sponge baths daily, 1x/week shower.  Pt brushes her teeth after setup and wipes face and ?privates to cuing. Communication / Swallowing Assistance Needed: non verbal on eval          OT Problem List: Decreased strength;Decreased activity tolerance;Impaired balance (sitting and/or standing);Decreased cognition;Decreased safety awareness;Decreased knowledge of use of DME or AE;Decreased knowledge of precautions;Pain;Impaired UE functional use      OT Treatment/Interventions: Self-care/ADL training;Energy conservation;DME and/or AE instruction;Therapeutic activities;Patient/family education;Balance training    OT Goals(Current goals can be found in the care plan section) Acute Rehab OT Goals Patient Stated Goal: spouse: get her assisting me more. OT Goal Formulation: With family Time For Goal Achievement: 07/26/18 Potential to Achieve Goals: Fair ADL Goals Pt Will Perform Eating: (P) with  mod assist Pt Will Perform Grooming: (P) with mod assist Pt Will Transfer to Toilet: (P) squat pivot transfer;with max assist;bedside commode  OT Frequency: Min 2X/week   Barriers to D/C:            Co-evaluation              AM-PAC PT "6 Clicks" Daily Activity     Outcome Measure Help from another person eating meals?: Total Help from another person taking care of personal grooming?: Total Help from another person toileting, which includes using toliet, bedpan, or urinal?: Total Help from another person bathing (including washing, rinsing, drying)?: Total Help from another person to put on and taking off regular upper body clothing?: Total Help from another person to put on and taking off regular lower body clothing?: Total 6 Click Score: 6   End of Session Equipment Utilized During Treatment: Gait belt  Activity Tolerance: Patient limited by pain;Patient limited by fatigue Patient left: in bed;with call bell/phone within reach;with bed alarm set;with family/visitor present  OT Visit Diagnosis: Muscle weakness (generalized) (M62.81);Other symptoms and signs involving cognitive function;Pain;Cognitive communication deficit (R41.841)                Time: 1245-1305 OT Time Calculation (min): 20 min Charges:  OT General Charges $OT Visit:  1 Visit OT Evaluation $OT Eval Moderate Complexity: [place], OT Acute Rehabilitation Services Pager: [phone] Office: [phone]   Hortencia Pilar 07/12/2018, 1:21 PM

## 2018-07-12 NOTE — Progress Notes (Signed)
SLP Cancellation Note  Patient Details Name: MARGAN ELIAS MRN: [id number] DOB: 1927-06-18   Cancelled treatment:       Reason Eval/Treat Not Completed: Patient at procedure or test/unavailable. Discussed with pts daughter. Pt typically eats regular foods and daughter feels she is returning to baseline. Let her know we will likely not be available to upgrade diet until Monday unless Mrs. Southall refuses to eat puree.    Santhiago Collingsworth, Katherene Ponto 07/12/2018, 2:11 PM

## 2018-07-13 LAB — BASIC METABOLIC PANEL
ANION GAP: 8 (ref 5–15)
BUN: 14 mg/dL (ref 8–23)
CALCIUM: 8.7 mg/dL — AB (ref 8.9–10.3)
CHLORIDE: 110 mmol/L (ref 98–111)
CO2: 21 mmol/L — AB (ref 22–32)
Creatinine, Ser: 0.83 mg/dL (ref 0.44–1.00)
GFR calc non Af Amer: >60 mL/min (ref >60)
GLUCOSE: 72 mg/dL (ref 70–99)
Potassium: 3.4 mmol/L — ABNORMAL LOW (ref 3.5–5.1)
Sodium: 139 mmol/L (ref 135–145)

## 2018-07-13 LAB — GLUCOSE, CAPILLARY
Glucose-Capillary: 73 mg/dL (ref 70–99)
Glucose-Capillary: 87 mg/dL (ref 70–99)
Glucose-Capillary: 62 mg/dL — ABNORMAL LOW (ref 70–99)

## 2018-07-13 MED ORDER — POLYETHYLENE GLYCOL 3350 17 G PO PACK
17.0000 g | PACK | Freq: Every day | ORAL | Status: DC
  Filled 2018-07-13: qty 1

## 2018-07-13 MED ORDER — FLEET ENEMA 7-19 GM/118ML RE ENEM: 1.0000 | ENEMA | Freq: Every day | RECTAL | Status: DC | PRN

## 2018-07-13 MED ORDER — POTASSIUM CHLORIDE CRYS ER 20 MEQ PO TBCR
40.0000 meq | EXTENDED_RELEASE_TABLET | Freq: Once | ORAL | Status: AC
  Administered 2018-07-13: 40 meq via ORAL
  Filled 2018-07-13: qty 2

## 2018-07-13 NOTE — Progress Notes (Signed)
[facility] PROGRESS NOTE  Kathleen Haynes LYY:[id number] DOB: 1927-02-03 DOA: 07/10/2018  PCP: [name], MD  Brief History/Interval Summary: 84 y.o. female with medical history significant for Parkinson's disease, dementia, hypertension, hyperlipidemia, protein calorie malnutrition, who was brought to the ED by EMS after her husband became concerned that patient was acting like she was in more pain than usual, moaning and crying more, and also noticed that she seemed to be exhibiting right sided upper extremity weakness and was having trouble sitting upright (per son leans to the left chronically and this was no different today except that she seemed to be leaning more drastically).  Patient was found to have abnormal UA.  There was also concern for an acute stroke.  She was hospitalized for further management.    Reason for Visit: Acute metabolic encephalopathy.  Right-sided hemiparesis and suspected CVA  Consultants: Neurology.  Palliative medicine.  Procedures:   Transthoracic echocardiogram Study Conclusions  - Left ventricle: The cavity size was normal. There was mild focal   basal hypertrophy of the septum. Systolic function was normal.   The estimated ejection fraction was in the range of 60% to 65%.   Wall motion was normal; there were no regional wall motion   abnormalities. Doppler parameters are consistent with abnormal   left ventricular relaxation (grade 1 diastolic dysfunction).   Doppler parameters are consistent with high ventricular filling   pressure. - Aortic valve: There was mild regurgitation.  Impressions:  - Normal LV systolic function; mild diastolic dysfunction; mild AI. Antibiotics: Ceftriaxone  Subjective/Interval History: Patient with eyes open but does not follow any commands.  Does not communicate.  She is moaning and crying at times.  ROS: Unable to do due to her mental status  Objective:  Vital Signs  Vitals:   07/12/18 1936  07/12/18 2343 07/13/18 0353 07/13/18 0700  BP: 135/78 (!) 145/84 124/69 132/75  Pulse: 84 83 83 92  Resp: 17 18 16 16   Temp: 97.9 F (36.6 C) 98.2 F (36.8 C) 97.7 F (36.5 C) 97.7 F (36.5 C)  TempSrc: Oral Oral Oral Oral  SpO2: 95% 100% 100% 93%  Weight:      Height:        Intake/Output Summary (Last 24 hours) at 07/13/2018 1155 Last data filed at 07/13/2018 0000 Gross per 24 hour  Intake 1576.75 ml  Output 300 ml  Net 1276.75 ml   Filed Weights   07/10/18 1059 07/10/18 2007  Weight: 48 kg 44.6 kg    General appearance: Awake alert but not really communicative. Resp: Normal effort at rest.  Diminished air entry at the bases.  No wheezing rales or rhonchi Cardio: S1-S2 is normal regular. GI:.  Abdomen appears to be soft.  Nontender nondistended Extremities: She is noted to be moving her extremities left more than right Neurologic: Somnolent but arousable.  Eyes open.  Moving her left side more than the right side.    Lab Results:  Data Reviewed: I have personally reviewed following labs and imaging studies  CBC: Recent Labs  Lab 07/10/18 1031 07/10/18 1035 07/11/18 0421  WBC 7.1  --  6.4  NEUTROABS 3.2  --   --   HGB 14.2 14.3 11.9*  HCT 44.5 42.0 37.7  MCV 94.5  --  92.9  PLT 241  --  127    Basic Metabolic Panel: Recent Labs  Lab 07/10/18 1031 07/10/18 1035 07/11/18 0421 07/12/18 0342 07/13/18 0437  NA 142 143 143 140 139  K 3.8 3.9 3.4* 3.5 3.4*  CL 108 107 108 109 110  CO2 22  --  22 19* 21*  GLUCOSE 130* 127* 73 58* 72  BUN 23 25* 25* 20 14  CREATININE 1.09* 1.00 1.14* 1.15* 0.83  CALCIUM 9.6  --  9.0 8.7* 8.7*    GFR: Estimated Creatinine Clearance: 33 mL/min (by C-G formula based on SCr of 0.83 mg/dL).  Liver Function Tests: Recent Labs  Lab 07/10/18 1031  AST 43*  ALT 17  ALKPHOS 72  BILITOT 0.8  PROT 7.3  ALBUMIN 3.9     Coagulation Profile: Recent Labs  Lab 07/10/18 1031  INR 1.02    HbA1C: Recent Labs     07/11/18 0421  HGBA1C 4.9    CBG: Recent Labs  Lab 07/12/18 1206 07/12/18 1612 07/12/18 1842 07/13/18 0619 07/13/18 1140  GLUCAP 84 60* 105* 73 87    Lipid Profile: Recent Labs    07/11/18 0421  CHOL 136  HDL 49  LDLCALC 66  TRIG 107  CHOLHDL 2.8    Thyroid Function Tests: Recent Labs    07/11/18 0421  TSH 2.693     Recent Results (from the past 240 hour(s))  Urine culture     Status: Abnormal   Collection Time: 07/10/18  1:42 PM  Result Value Ref Range Status   Specimen Description URINE, CATHETERIZED  Final   Special Requests   Final    NONE Performed at [facility], [place] [address] [place], [place] [address]    Culture (A)  Final    >=100,000 COLONIES/mL MULTIPLE SPECIES PRESENT, SUGGEST RECOLLECTION   Report Status 07/12/2018 FINAL  Final      Radiology Studies: Ct Angio Head W Or Wo Contrast  Result Date: 07/11/2018 CLINICAL DATA:  Followup code stroke. Dementia. Parkinson's disease. Right-sided upper extremity weakness. EXAM: CT ANGIOGRAPHY HEAD AND NECK TECHNIQUE: Multidetector CT imaging of the head and neck was performed using the standard protocol during bolus administration of intravenous contrast. Multiplanar CT image reconstructions and MIPs were obtained to evaluate the vascular anatomy. Carotid stenosis measurements (when applicable) are obtained utilizing NASCET criteria, using the distal internal carotid diameter as the denominator. CONTRAST:  174mL ISOVUE-370 IOPAMIDOL (ISOVUE-370) INJECTION 76% COMPARISON:  Head CT yesterday FINDINGS: CT HEAD FINDINGS Brain: No appreciable change since yesterday. Generalized atrophy. Old inferior cerebellar infarction on the right. Old right frontal cortical and subcortical infarction. Chronic small-vessel ischemic changes of the white matter. Vascular: There is atherosclerotic calcification of the major vessels at the base of the brain. Skull: Negative Sinuses: Clear Orbits: Negative Review of the MIP  images confirms the above findings CTA NECK FINDINGS Aortic arch: Aortic atherosclerosis. Branching pattern is normal without flow limiting origin stenosis. 30% narrowing of the left subclavian origin. Right carotid system: Common carotid artery is tortuous but widely patent to the bifurcation. There is atherosclerotic plaque at the carotid bifurcation and ICA bulb. Minimal diameter of the proximal ICA is 3 mm. Compared to a more distal cervical ICA diameter of 4 mm, this indicates a 25% stenosis. Left carotid system: Common carotid artery is widely patent to the bifurcation. There is calcified plaque at the carotid bifurcation and ICA bulb. Minimal diameter of the ICA bulb is 4 mm. Compared to a more distal cervical ICA diameter of 4 mm, there is no stenosis. Vertebral arteries: Calcified plaque at the right vertebral artery origin but without origin stenosis. Beyond that, the vessel becomes narrow and appears to be occluded  in the lower cervical region. The left vertebral artery shows wide patency at its origin. The vessel is patent through the cervical region to the foramen magnum. The distal right vertebral artery is reconstituted by cervical collaterals at the C1 level as a robust vessel. There is 50% stenosis as the vessel penetrates the dura at the foramen magnum. Skeleton: Mild degenerative spondylosis. 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 This is unchanged from previous studies. Other neck: No mass or lymphadenopathy. Upper chest: Normal Review of the MIP images confirms the above findings CTA HEAD FINDINGS Anterior circulation: Both internal carotid arteries are widely patent through the skull base and siphon regions. No stenosis. The anterior and middle cerebral vessels are patent without evidence of proximal stenosis or aneurysm. No occluded branch vessels are identified. Posterior circulation: Both vertebral arteries contribute to the basilar. No basilar stenosis.  Posterior circulation branch vessels are patent. Venous sinuses: Patent and normal. Anatomic variants: None significant. Delayed phase: No abnormal enhancement. Review of the MIP images confirms the above findings IMPRESSION: No acute vascular finding. Atherosclerotic change at both carotid bifurcations and ICA bulbs. Despite the presence of atherosclerosis, there is no flow limiting stenosis. 25% narrowing of the ICA bulb on the right. No stenosis on the left. No intracranial anterior circulation large or medium vessel abnormality. Dominant left vertebral artery widely patent. Non dominant right vertebral artery is occluded in the lower cervical region and reconstituted in the upper cervical region by cervical collaterals. No intracranial posterior circulation large or medium vessel abnormality presently. Electronically Signed   By: Nelson Chimes M.D.   On: 07/11/2018 14:17   Ct Angio Neck W Or Wo Contrast  Result Date: 07/11/2018 CLINICAL DATA:  Followup code stroke. Dementia. Parkinson's disease. Right-sided upper extremity weakness. EXAM: CT ANGIOGRAPHY HEAD AND NECK TECHNIQUE: Multidetector CT imaging of the head and neck was performed using the standard protocol during bolus administration of intravenous contrast. Multiplanar CT image reconstructions and MIPs were obtained to evaluate the vascular anatomy. Carotid stenosis measurements (when applicable) are obtained utilizing NASCET criteria, using the distal internal carotid diameter as the denominator. CONTRAST:  172mL ISOVUE-370 IOPAMIDOL (ISOVUE-370) INJECTION 76% COMPARISON:  Head CT yesterday FINDINGS: CT HEAD FINDINGS Brain: No appreciable change since yesterday. Generalized atrophy. Old inferior cerebellar infarction on the right. Old right frontal cortical and subcortical infarction. Chronic small-vessel ischemic changes of the white matter. Vascular: There is atherosclerotic calcification of the major vessels at the base of the brain. Skull:  Negative Sinuses: Clear Orbits: Negative Review of the MIP images confirms the above findings CTA NECK FINDINGS Aortic arch: Aortic atherosclerosis. Branching pattern is normal without flow limiting origin stenosis. 30% narrowing of the left subclavian origin. Right carotid system: Common carotid artery is tortuous but widely patent to the bifurcation. There is atherosclerotic plaque at the carotid bifurcation and ICA bulb. Minimal diameter of the proximal ICA is 3 mm. Compared to a more distal cervical ICA diameter of 4 mm, this indicates a 25% stenosis. Left carotid system: Common carotid artery is widely patent to the bifurcation. There is calcified plaque at the carotid bifurcation and ICA bulb. Minimal diameter of the ICA bulb is 4 mm. Compared to a more distal cervical ICA diameter of 4 mm, there is no stenosis. Vertebral arteries: Calcified plaque at the right vertebral artery origin but without origin stenosis. Beyond that, the vessel becomes narrow and appears to be occluded in the lower cervical region. The left vertebral artery  shows wide patency at its origin. The vessel is patent through the cervical region to the foramen magnum. The distal right vertebral artery is reconstituted by cervical collaterals at the C1 level as a robust vessel. There is 50% stenosis as the vessel penetrates the dura at the foramen magnum. Skeleton: Mild degenerative spondylosis. 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 This is unchanged from previous studies. Other neck: No mass or lymphadenopathy. Upper chest: Normal Review of the MIP images confirms the above findings CTA HEAD FINDINGS Anterior circulation: Both internal carotid arteries are widely patent through the skull base and siphon regions. No stenosis. The anterior and middle cerebral vessels are patent without evidence of proximal stenosis or aneurysm. No occluded branch vessels are identified. Posterior circulation: Both  vertebral arteries contribute to the basilar. No basilar stenosis. Posterior circulation branch vessels are patent. Venous sinuses: Patent and normal. Anatomic variants: None significant. Delayed phase: No abnormal enhancement. Review of the MIP images confirms the above findings IMPRESSION: No acute vascular finding. Atherosclerotic change at both carotid bifurcations and ICA bulbs. Despite the presence of atherosclerosis, there is no flow limiting stenosis. 25% narrowing of the ICA bulb on the right. No stenosis on the left. No intracranial anterior circulation large or medium vessel abnormality. Dominant left vertebral artery widely patent. Non dominant right vertebral artery is occluded in the lower cervical region and reconstituted in the upper cervical region by cervical collaterals. No intracranial posterior circulation large or medium vessel abnormality presently. Electronically Signed   By: Nelson Chimes M.D.   On: 07/11/2018 14:17   Vas US Carotid  Result Date: 07/11/2018 Carotid Arterial Duplex Study Indications:   CVA and Weakness. Right arm Risk Factors:  Hypertension, hyperlipidemia. Other Factors: Parkinsons, dementia. Performing Technologist: Toma Copier RVS  Examination Guidelines: A complete evaluation includes B-mode imaging, spectral Doppler, color Doppler, and power Doppler as needed of all accessible portions of each vessel. Bilateral testing is considered an integral part of a complete examination. Limited examinations for reoccurring indications may be performed as noted.  Right Carotid Findings: +----------+--------+--------+--------+--------------------+-------------------+           PSV cm/sEDV cm/sStenosisDescribe            Comments            +----------+--------+--------+--------+--------------------+-------------------+ CCA Prox  87      17                                  mild intimal                                                              changes              +----------+--------+--------+--------+--------------------+-------------------+ CCA Distal57      13                                  mild intimal  changes             +----------+--------+--------+--------+--------------------+-------------------+ ICA Prox  49      15              diffuse and         mild plaque                                           heterogenous                            +----------+--------+--------+--------+--------------------+-------------------+ ICA Mid   58      15                                                      +----------+--------+--------+--------+--------------------+-------------------+ ICA Distal86      28                                  tortuous            +----------+--------+--------+--------+--------------------+-------------------+ ECA       46      2                                   intimal thickening  +----------+--------+--------+--------+--------------------+-------------------+ +----------+--------+-------+--------+-------------------+           PSV cm/sEDV cmsDescribeArm Pressure (mmHG) +----------+--------+-------+--------+-------------------+ [id number]     0                                  +----------+--------+-------+--------+-------------------+ +---------+--------+--+--------+-+ VertebralPSV cm/s20EDV cm/s4 +---------+--------+--+--------+-+  Left Carotid Findings: +----------+--------+--------+--------+------------+--------------------+           PSV cm/sEDV cm/sStenosisDescribe    Comments             +----------+--------+--------+--------+------------+--------------------+ CCA Prox  39      8                           mild intimal changes +----------+--------+--------+--------+------------+--------------------+ CCA Distal50      9                           mild intimal changes  +----------+--------+--------+--------+------------+--------------------+ ICA Prox  56      15              heterogenousmild plaque          +----------+--------+--------+--------+------------+--------------------+ ICA Mid   112     36              heterogenousmild plaque          +----------+--------+--------+--------+------------+--------------------+ ICA Distal70      23                          tortuous             +----------+--------+--------+--------+------------+--------------------+ ECA       46      7  heterogenousmild plaque          +----------+--------+--------+--------+------------+--------------------+ +----------+--------+--------+--------+-------------------+ SubclavianPSV cm/sEDV cm/sDescribeArm Pressure (mmHG) +----------+--------+--------+--------+-------------------+           79                                          +----------+--------+--------+--------+-------------------+ +---------+--------+--+--------+-+ VertebralPSV cm/s29EDV cm/s8 +---------+--------+--+--------+-+  Summary: Right Carotid: Velocities in the right ICA are consistent with a 1-39% stenosis. Left Carotid: Velocities in the left ICA are consistent with a 1-39% stenosis. Vertebrals:  Bilateral vertebral arteries demonstrate antegrade flow. Subclavians: Normal flow hemodynamics were seen in bilateral subclavian              arteries. *See table(s) above for measurements and observations.  Electronically signed by Antony Contras MD on 07/11/2018 at 2:47:51 PM.    Final      Medications:  Scheduled: .  stroke: mapping our early stages of recovery book   Does not apply Once  . aspirin  81 mg Oral Daily  . carbidopa-levodopa  1 tablet Oral BID  . darifenacin  7.5 mg Oral Daily  . donepezil  5 mg Oral QHS  . enoxaparin (LOVENOX) injection  30 mg Subcutaneous Q24H  . memantine  14 mg Oral BH-q7a  . polyethylene glycol  17 g Oral Once per day on Mon Thu  .  potassium chloride SA  20 mEq Oral Daily  . pravastatin  80 mg Oral BH-q7a   Continuous: . sodium chloride    . sodium chloride 50 mL/hr at 07/12/18 2230  . cefTRIAXone (ROCEPHIN)  IV Stopped (07/12/18 1535)   VVO:[id number] **OR** acetaminophen (TYLENOL) oral liquid 160 mg/5 mL **OR** acetaminophen, senna-docusate    Assessment/Plan:  Right-sided weakness/suspected acute CVA, nonhemorrhagic CT scan did not show any acute findings.  It appears that patient cannot get MRI as she reportedly has some metal implants in her ear.  Neurology was consulted.  Patient underwent CT angiogram head and neck which did not show any acute findings.  Echocardiogram report is as above.  No significant abnormalities noted.  No significant stenosis on carotid Dopplers.  Patient remains on aspirin and statin.  Seen by PT and OT and speech therapy.  Is on a pured diet. LDL is 66.  HbA1c 4.9.  TSH 2.69.  Patient has multiple underlying health issues with poor quality of life at baseline and poor functioning at baseline.  Palliative medicine was consulted.  Family reluctant to consider hospice at this time.  Neurology did not have any further recommendations and have signed off.   Acute cystitis UA noted to be abnormal.  Patient started on ceftriaxone.  Urine cultures with multiple species.  Today will be day 4 of her ceftriaxone.  She has been afebrile.  Will discontinue ceftriaxone after her fifth dose which will be administered tomorrow.  Hypoglycemia Not on any glucose lowering agents.  This was most likely due to poor oral intake.  He was given half an ampule of D50 with improvement.  Had another episode of hypoglycemia yesterday afternoon.  Discussed with family members.  If she continues to have poor oral intake this may be indicative a very poor prognosis.    Essential hypertension Blood pressure has been reasonably well controlled.  Currently not on any blood pressure lowering agents.  Potassium will be  repleted.  History of Parkinson's disease with dementia Patient apparently has poor functioning  at baseline.  She is for the most part bedridden but does get around in a wheelchair with assistance from her husband.  She is on Sinemet Aricept and memantine which is being continued.  Mentation about the same as yesterday.  Family members at bedside and patient is not engaging with them either.  Hyperlipidemia LDL 66.  Continue statin.  Goals of care Patient appears to have a poor functioning at baseline with possibly poor quality of life.  She has chronic neurological disorders which are progressive in nature.  Patient seen by palliative medicine.  They have discussed hospice with patient's family but family is reluctant to consider this at this time.  Patient's husband wants to take her home.  We plan to reevaluate this tomorrow.  This will depend on how the patient is doing clinically as well as her oral intake as well as glucose levels.  She will likely need transportation by ambulance.  DVT Prophylaxis: Lovenox    Code Status: DNR Family Communication: Discussed with patient's husband and daughter and son at bedside Disposition Plan: Management as outlined above.    LOS: 3 days   [facility] Pager [phone] 07/13/2018, 11:55 AM  If 7PM-7AM, please contact night-coverage at [email or web address], password [facility]

## 2018-07-14 DIAGNOSIS — R400 Somnolence: Secondary | ICD-10-CM

## 2018-07-14 DIAGNOSIS — Z7189 Other specified counseling: Secondary | ICD-10-CM

## 2018-07-14 LAB — BASIC METABOLIC PANEL
ANION GAP: 12 (ref 5–15)
BUN: 11 mg/dL (ref 8–23)
CHLORIDE: 108 mmol/L (ref 98–111)
CO2: 19 mmol/L — AB (ref 22–32)
Calcium: 8.9 mg/dL (ref 8.9–10.3)
Creatinine, Ser: 1.09 mg/dL — ABNORMAL HIGH (ref 0.44–1.00)
GFR calc non Af Amer: 44 mL/min — ABNORMAL LOW (ref >60)
GFR, EST AFRICAN AMERICAN: 51 mL/min — AB (ref >60)
Glucose, Bld: 73 mg/dL (ref 70–99)
Potassium: 3.8 mmol/L (ref 3.5–5.1)
Sodium: 139 mmol/L (ref 135–145)

## 2018-07-14 LAB — GLUCOSE, CAPILLARY
Glucose-Capillary: 71 mg/dL (ref 70–99)
Glucose-Capillary: 69 mg/dL — ABNORMAL LOW (ref 70–99)
Glucose-Capillary: 136 mg/dL — ABNORMAL HIGH (ref 70–99)

## 2018-07-14 MED ORDER — DEXTROSE 5 % IV SOLN
INTRAVENOUS | Status: DC
  Administered 2018-07-14: 12:00:00 via INTRAVENOUS

## 2018-07-14 MED ORDER — DEXTROSE 50 % IV SOLN
25.0000 mL | Freq: Once | INTRAVENOUS | Status: AC
  Administered 2018-07-14: 25 mL via INTRAVENOUS
  Filled 2018-07-14: qty 50

## 2018-07-14 NOTE — Progress Notes (Signed)
Daily Progress Note   Patient Name: Kathleen Haynes       Date: 07/14/2018 DOB: 27-Jul-1927  Age: 84 y.o. MRN#: [id number] Attending Physician: Bonnielee Haff, MD Primary Care Physician: [name], MD Admit Date: 07/10/2018  Reason for Consultation/Follow-up: Establishing goals of care  Subjective: Kathleen Haynes is lying in bed in fetal position. I did not attempt to arouse and she made no efforts while we had discussion at bedside.   Length of Stay: 4  Current Medications: Scheduled Meds:  .  stroke: mapping our early stages of recovery book   Does not apply Once  . aspirin  81 mg Oral Daily  . carbidopa-levodopa  1 tablet Oral BID  . darifenacin  7.5 mg Oral Daily  . donepezil  5 mg Oral QHS  . enoxaparin (LOVENOX) injection  30 mg Subcutaneous Q24H  . memantine  14 mg Oral BH-q7a  . polyethylene glycol  17 g Oral Daily  . potassium chloride SA  20 mEq Oral Daily  . pravastatin  80 mg Oral BH-q7a    Continuous Infusions: . sodium chloride    . sodium chloride 50 mL/hr at 07/13/18 2154  . cefTRIAXone (ROCEPHIN)  IV 1 g (07/14/18 1419)  . dextrose 75 mL/hr at 07/14/18 1221    PRN Meds: acetaminophen **OR** acetaminophen (TYLENOL) oral liquid 160 mg/5 mL **OR** acetaminophen, senna-docusate, sodium phosphate  Physical Exam  Constitutional: She appears well-developed. She has a sickly appearance. She appears ill.  HENT:  Head: Normocephalic and atraumatic.  Neck brace in place  Cardiovascular: Normal rate.  Pulmonary/Chest: Effort normal. No accessory muscle usage. No tachypnea. No respiratory distress.  Abdominal: Normal appearance.  Neurological: She is unresponsive.  Nursing note and vitals reviewed.           Vital Signs: BP (!) 144/71 (BP Location: Right Arm)    Pulse (!) 101   Temp 98.3 F (36.8 C) (Axillary)   Resp 18   Ht 5' 2"  (1.575 m)   Wt 44.6 kg   SpO2 96%   BMI 17.98 kg/m  SpO2: SpO2: 96 % O2 Device: O2 Device: Room Air O2 Flow Rate:    Intake/output summary:   Intake/Output Summary (Last 24 hours) at 07/14/2018 1453 Last data filed at 07/14/2018 0643 Gross per 24 hour  Intake 600 ml  Output -  Net 600 ml   LBM:   Baseline Weight: Weight: 48 kg Most recent weight: Weight: 44.6 kg       Palliative Assessment/Data: 20%     Patient Active Problem List   Diagnosis Date Noted  . Encephalopathy   . Palliative care encounter   . Weakness 07/10/2018  . Right sided weakness 07/10/2018  . Acute lower UTI 07/10/2018  . Essential hypertension 12/01/2015  . Hyperlipidemia 12/01/2015  . Hip fracture ([place]) 11/30/2015  . Closed right hip fracture ([place]) 11/30/2015  . Fall 10/22/2014  . Traumatic pneumothorax 10/22/2014  . Protein-calorie malnutrition, severe ([place]) 10/17/2014  . Multiple fractures of ribs of left side 10/15/2014  . Parkinson's disease ([place]) 11/27/2012  . Unspecified sleep apnea 11/27/2012  . RBD (REM behavioral disorder) 11/27/2012  . Dementia due to Parkinson's disease without behavioral disturbance ([place]) 11/27/2012  . Hypersomnia with sleep apnea, unspecified 11/27/2012    Palliative Care Assessment & Plan   HPI: 84 y.o. female  with past medical history of parkinson's disease, dementia, neck fracture, multiple falls, and OSA who was admitted on 07/10/2018 with increased pain and weakness.  PMT was consulted on admission to assist with [place].  Working diagnosis is CVA and UTI.    Assessment: I met today at Kathleen Haynes's bedside after son and daughter requested to speak further with palliative care. They had many questions, good questions, regarding hospice care and options at home vs hospice facility vs interventions that are only done in the hospital. Husband is not present but they tell me that he is  "coming around" to having hospice care. They are hesitant to believe that Kathleen Haynes is at EOL. However, they are also able to admit that she may be at the EOL but they feel that she was doing poorly and sleeping a lot even prior to admission.   They are considering transition home with hospice care to see if they can get her in familiar surroundings and back in a routine to see if she will awaken more and be able to accept more food/water. I was very clear that this is an option but to be aware that she could decline and die rather soon off dextrose infusion but if they are accepting of this risk to get her home we will respect and help arrange these wishes.   All questions/concerns addressed. Emotional support provided.   Recommendations/Plan:  Somnolent: Discussed with RN plan to sit up high at mealtime and wash face with wet clothe to try and arouse. Family needs to see that we are trying and will be happy with the efforts. More so if efforts are made and she is still unable to have intake this is important for them to witness as well.   Goals of Care and Additional Recommendations:  Limitations on Scope of Treatment: Treat the treatable  Code Status:  DNR - I did not discuss this today  Prognosis:   < 2 weeks  Discharge Planning:  To Be Determined  Care plan was discussed with Dr. [place] Pink  Thank you for allowing the Palliative Medicine Team to assist in the care of this patient.   Total Time 30 min Prolonged Time Billed  no       Greater than 50%  of this time was spent counseling and coordinating care related to the above assessment and plan.  Vinie Sill, NP Palliative Medicine Team Pager # [phone] (M-F 8a-5p) Team Phone # [phone] (Nights/Weekends)

## 2018-07-14 NOTE — Progress Notes (Signed)
[facility] PROGRESS NOTE  Kathleen Haynes GNO:[id number] DOB: 22-Jan-1927 DOA: 07/10/2018  PCP: [name], MD  Brief History/Interval Summary: 84 y.o. female with medical history significant for Parkinson's disease, dementia, hypertension, hyperlipidemia, protein calorie malnutrition, who was brought to the ED by EMS after her husband became concerned that patient was acting like she was in more pain than usual, moaning and crying more, and also noticed that she seemed to be exhibiting right sided upper extremity weakness and was having trouble sitting upright (per son leans to the left chronically and this was no different today except that she seemed to be leaning more drastically).  Patient was found to have abnormal UA.  There was also concern for an acute stroke.  She was hospitalized for further management.    Reason for Visit: Acute metabolic encephalopathy.  Right-sided hemiparesis and suspected CVA  Consultants: Neurology.  Palliative medicine.  Procedures:   Transthoracic echocardiogram Study Conclusions  - Left ventricle: The cavity size was normal. There was mild focal   basal hypertrophy of the septum. Systolic function was normal.   The estimated ejection fraction was in the range of 60% to 65%.   Wall motion was normal; there were no regional wall motion   abnormalities. Doppler parameters are consistent with abnormal   left ventricular relaxation (grade 1 diastolic dysfunction).   Doppler parameters are consistent with high ventricular filling   pressure. - Aortic valve: There was mild regurgitation.  Impressions:  - Normal LV systolic function; mild diastolic dysfunction; mild AI. Antibiotics: Ceftriaxone  Subjective/Interval History: Patient noted to be asleep this morning.  She was woken up.  She does open her eyes.  But then goes right back to sleep.  Does not follow any commands.  Does not communicate.   Objective:  Vital Signs  Vitals:   07/14/18 0016 07/14/18 0522 07/14/18 0735 07/14/18 1216  BP: 137/85 122/60 128/67 (!) 144/71  Pulse: (!) 110 (!) 111 99 (!) 101  Resp: 20 20 18 18   Temp: 98.7 F (37.1 C) 100.2 F (37.9 C) 98.9 F (37.2 C) 98.3 F (36.8 C)  TempSrc: Axillary Axillary Axillary Axillary  SpO2: 96% 97% 97% 96%  Weight:      Height:        Intake/Output Summary (Last 24 hours) at 07/14/2018 1227 Last data filed at 07/14/2018 0643 Gross per 24 hour  Intake 600 ml  Output -  Net 600 ml   Filed Weights   07/10/18 1059 07/10/18 2007  Weight: 48 kg 44.6 kg    General appearance: Somnolent but arousable Resp: Normal effort at rest.  Diminished air entry at the bases.  Clear to auscultation otherwise Cardio: S1-S2 is normal regular GI:.  Abdomen is soft.  No tenderness elicited on examination. Extremities: No edema noted Neurologic: Somnolent but arousable.  Lab Results:  Data Reviewed: I have personally reviewed following labs and imaging studies  CBC: Recent Labs  Lab 07/10/18 1031 07/10/18 1035 07/11/18 0421  WBC 7.1  --  6.4  NEUTROABS 3.2  --   --   HGB 14.2 14.3 11.9*  HCT 44.5 42.0 37.7  MCV 94.5  --  92.9  PLT 241  --  169    Basic Metabolic Panel: Recent Labs  Lab 07/10/18 1031 07/10/18 1035 07/11/18 0421 07/12/18 0342 07/13/18 0437 07/14/18 0444  NA 142 143 143 140 139 139  K 3.8 3.9 3.4* 3.5 3.4* 3.8  CL 108 107 108 109 110 108  CO2 22  --  22 19* 21* 19*  GLUCOSE 130* 127* 73 58* 72 73  BUN 23 25* 25* 20 14 11   CREATININE 1.09* 1.00 1.14* 1.15* 0.83 1.09*  CALCIUM 9.6  --  9.0 8.7* 8.7* 8.9    GFR: Estimated Creatinine Clearance: 25.1 mL/min (A) (by C-G formula based on SCr of 1.09 mg/dL (H)).  Liver Function Tests: Recent Labs  Lab 07/10/18 1031  AST 43*  ALT 17  ALKPHOS 72  BILITOT 0.8  PROT 7.3  ALBUMIN 3.9     Coagulation Profile: Recent Labs  Lab 07/10/18 1031  INR 1.02    HbA1C: No results for input(s): HGBA1C in the last 72  hours.  CBG: Recent Labs  Lab 07/13/18 0619 07/13/18 1140 07/13/18 1629 07/14/18 0614 07/14/18 1116  GLUCAP 73 87 62* 71 69*     Recent Results (from the past 240 hour(s))  Urine culture     Status: Abnormal   Collection Time: 07/10/18  1:42 PM  Result Value Ref Range Status   Specimen Description URINE, CATHETERIZED  Final   Special Requests   Final    NONE Performed at [facility], [place] [address] [place], [place] [address]    Culture (A)  Final    >=100,000 COLONIES/mL MULTIPLE SPECIES PRESENT, SUGGEST RECOLLECTION   Report Status 07/12/2018 FINAL  Final      Radiology Studies: No results found.   Medications:  Scheduled: .  stroke: mapping our early stages of recovery book   Does not apply Once  . aspirin  81 mg Oral Daily  . carbidopa-levodopa  1 tablet Oral BID  . darifenacin  7.5 mg Oral Daily  . donepezil  5 mg Oral QHS  . enoxaparin (LOVENOX) injection  30 mg Subcutaneous Q24H  . memantine  14 mg Oral BH-q7a  . polyethylene glycol  17 g Oral Daily  . potassium chloride SA  20 mEq Oral Daily  . pravastatin  80 mg Oral BH-q7a   Continuous: . sodium chloride    . sodium chloride 50 mL/hr at 07/13/18 2154  . cefTRIAXone (ROCEPHIN)  IV 1 g (07/13/18 1443)  . dextrose 75 mL/hr at 07/14/18 1221   JHE:RDEYCXKGYJEHU **OR** acetaminophen (TYLENOL) oral liquid 160 mg/5 mL **OR** acetaminophen, senna-docusate, sodium phosphate    Assessment/Plan:  Right-sided weakness/suspected acute CVA, nonhemorrhagic CT scan did not show any acute findings.  It appears that patient cannot get MRI as she reportedly has some metal implants in her ear.  Neurology was consulted.  Patient underwent CT angiogram head and neck which did not show any acute findings.  Echocardiogram report is as above.  No significant abnormalities noted.  No significant stenosis on carotid Dopplers.  Patient remains on aspirin and statin.  Seen by PT and OT and speech therapy.  LDL is 66.   HbA1c 4.9.  TSH 2.69.  Patient has multiple underlying health issues with poor quality of life at baseline and poor functioning at baseline.  Palliative medicine was consulted.  Family has been reluctant to consider hospice.  However patient does not appear to be improving and actually appears to be declining with poor oral intake and hypoglycemic episodes.  Discussed with patient's husband today and he appears to be more amenable to hospice.  He will discuss with the family members.  Palliative medicine will return tomorrow.   Acute cystitis UA noted to be abnormal.  Patient started on ceftriaxone.  Urine cultures with multiple species.  Today will be day 5 of ceftriaxone.  Could consider discontinuing but we will wait an additional day.  Hypoglycemia Hypoglycemic episodes most likely due to very poor oral intake.  This appears to be a very poor prognostic factor.  Patient to be given half all ampule of D50 and then will be started on D5 infusion even though there is concern for stroke.  Discussed in detail with patient's husband.    Essential hypertension Blood pressure reasonably well controlled.  Continue to monitor.  Potassium normal.    History of Parkinson's disease with dementia Patient apparently has poor functioning at baseline.  She is for the most part bedridden but does get around in a wheelchair with assistance from her husband.  She is on Sinemet Aricept and memantine which is being continued.  Patient remains confused and not very responsive.  But this could be due to hypoglycemia.  Monitor for now.  Hyperlipidemia LDL 66.  Continue statin.  Goals of care Patient appears to have a poor functioning at baseline with possibly poor quality of life.  She has chronic neurological disorders which are progressive in nature.  Patient seen by palliative medicine.  They have discussed hospice with patient's family but family has been reluctant to consider this.  Patient's husband wanted to  take her home.  Patient appears to be declining now with extremely poor oral intake and hypoglycemia.  Patient's husband seems to be more amenable to her going to hospice.  He will discuss with other family members.  Palliative medicine will return tomorrow.    DVT Prophylaxis: Lovenox    Code Status: DNR Family Communication: Discussed with patient's husband Disposition Plan: Management as outlined above.    LOS: 4 days   [facility] Pager [phone] 07/14/2018, 12:27 PM  If 7PM-7AM, please contact night-coverage at [email or web address], password [facility]

## 2018-07-14 NOTE — Plan of Care (Signed)
  Problem: Education: Goal: Knowledge of General Education information will improve Description Including pain rating scale, medication(s)/side effects and non-pharmacologic comfort measures Outcome: Not Progressing Note:  POC reviewed with pt.; unsure how much she understands; pt. is mute.

## 2018-07-15 LAB — GLUCOSE, CAPILLARY: Glucose-Capillary: 107 mg/dL — ABNORMAL HIGH (ref 70–99)

## 2018-07-15 MED ORDER — MORPHINE SULFATE (CONCENTRATE) 10 MG /0.5 ML PO SOLN: 5.0000 mg | ORAL | 0 refills | Status: AC | PRN

## 2018-07-15 MED ORDER — LORAZEPAM 2 MG/ML PO CONC: 1.0000 mg | Freq: Four times a day (QID) | ORAL | 0 refills | Status: AC | PRN

## 2018-07-15 NOTE — Progress Notes (Signed)
Palliative:  Met at Kathleen Haynes's bedside with husband. He shares that they have decided to take her home with hospice care. He expresses that his children shares my conversation with them from yesterday and he has no further questions or concerns at this time. Offered emotional support. Plan for home with hospice care today.   No charge  Vinie Sill, NP Palliative Medicine Team Pager # [phone] (M-F 8a-5p) Team Phone # [phone] (Nights/Weekends)

## 2018-07-15 NOTE — Evaluation (Signed)
SLP Cancellation Note  Patient Details Name: Kathleen Haynes MRN: [id number] DOB: 1927-07-28   Cancelled treatment:       Reason Eval/Treat Not Completed: Other (comment)(pt not alert for po, note plan to dc with hospice, will sign off)   Macario Golds 07/15/2018, 11:12 AM   Luanna Salk, MS [facility] SLP [place] Pager [phone] Office [phone]

## 2018-07-15 NOTE — Discharge Instructions (Signed)
Hospice Introduction Hospice is a service that is designed to provide people who are terminally ill and their families with medical, spiritual, and psychological support. Its aim is to improve your quality of life by keeping you as alert and comfortable as possible. Who will be my providers when I begin hospice care? Hospice teams often include:  A nurse.  A doctor. The hospice doctor will be available for your care, but you can bring your regular doctor or nurse practitioner.  Social workers.  Religious leaders (such as a Clinical biochemist).  Trained volunteers.  What roles will providers play in my care? Hospice is performed by a team of health care professionals and volunteers who:  Help keep you comfortable: ? Hospice can be provided in your home or in a homelike setting. ? The hospice staff works with your family and friends to help meet your needs. ? You will enjoy the support of loved ones by receiving much of your basic care from family and friends.  Provide pain relief and manage your symptoms. The staff supply all necessary medicines and equipment.  Provide companionship when you are alone.  Allow you and your family to rest. They may do light housekeeping, prepare meals, and run errands.  Provide counseling. They will make sure your emotional, spiritual, and social needs and those of your family are being met.  Provide spiritual care: ? Spiritual care will be individualized to meet your needs and your family's needs. ? Spiritual care may involve:  Helping you look at what death means to you.  Helping you say goodbye to your family and friends.  Performing a specific religious ceremony or ritual.  When should hospice care begin? Most people who use hospice are believed to have fewer than 6 months to live.  Your family and health care providers can help you decide when hospice services should begin.  If your condition improves, you may discontinue the program.  What  should I consider before selecting a program? Most hospice programs are run by nonprofit, independent organizations. Some are affiliated with hospitals, nursing homes, or home health care agencies. Hospice programs can take place in the home or at a hospice center, hospital, or skilled nursing facility. When choosing a hospice program, ask the following questions:  What services are available to me?  What services will be offered to my loved ones?  How involved will my loved ones be?  How involved will my health care provider be?  Who makes up the hospice care team? How are they trained or screened?  How will my pain and symptoms be managed?  If my circumstances change, can the services be provided in a different setting, such as my home or in the hospital?  Is the program reviewed and licensed by the state or certified in some other way?  Where can I learn more about hospice? You can learn about existing hospice programs in your area from your health care providers. You can also read more about hospice online. The websites of the following organizations contain helpful information:  The [facility] and Palliative Care Organization [facility]).  The Hospice Association of America ([place]).  The [place].  The American Cancer Society (ACS).  Hospice Net.  This information is not intended to replace advice given to you by your health care provider. Make sure you discuss any questions you have with your health care provider. Document Released: 12/01/2003 Document Revised: 03/30/2016 Document Reviewed: 06/24/2013 Elsevier Interactive Patient Education  2017 [employer].

## 2018-07-15 NOTE — Progress Notes (Addendum)
[facility] Liaison: RN visit   Notified by Jacqualin Combes, [facility] of patient/family request for [facility] services at home after discharge. Chart and patient information under review by [facility] physician. Hospice eligibility pending at this time.  Writer spoke with patient husband [name] at bedside to initiate education related to hospice philosophy, services and team approach to care.   [name] verbalized understanding of information given. Per discussion, plan is for discharge to home by [facility] today.  Please send signed and completed DNR form home with patient/family. Patient will need prescriptions for discharge comfort medications.  DME needs have been discussed, patient currently has the following equipment in the home: wheelchair, BSC, Bed, and walker. Patient/family requests the following DME for delivery to the home:  [employer]. [facility] equipment manager has been notified and will contact [facility] to arrange delivery to the home. Home address has been verified and is correct in the chart. Gisselle Galvis is the family member to contact to arrange time of delivery.  [facility] Referral Center aware of the above. Please notify [facility] when patient is ready to leave the unit at discharge. (Call [phone] or [phone] after 5pm.) [facility] information and contact numbers given to [facility] at time of visit. Above information shared with CMRN.  Please call with any hospice related questions.  Thank you for this referral.  Gar Ponto, [facility] Liaison [phone] ? Hospital liaisons are now on [place].   UPDATE: Notified CMRN and patient spouse [name] that patient is hospice eligible per [facility] MD.  Harrel Lemon Lift has been ordered by [facility]. [facility] Admission Nurse to set up appointment with Spouse and has been given his contact number. Spouse has approved a [facility] RN admission visit for tomorrow morning. CM RN and [facility] Referral Center aware of above information.

## 2018-07-15 NOTE — Progress Notes (Signed)
Occupational Therapy Treatment Patient Details Name: Kathleen Haynes MRN: [id number] DOB: 1927-01-28 Today's Date: 07/15/2018    History of present illness pt is a n 84 y/o female with h/o Parkinson's ds, dementia, HTN, admitted to the ED with concerns of appearing to be in more pain than usual with more moaning and crying.  Furthermore, pt looked to have more right UE weakness and even more trouble sitting upright.   OT comments  Patient side lying in bed, flexed position of UEs and LEs.  Patients family at bedside, discussed OT role, and family eager for further education on position and safety in preparation for dc home with hospice care.  Educated on positioning in bed for contracture prevention, skin breakdown/pressure sore prevention, and comfort; stretched UEs into extension and encouraged hand extension, patient resisted extension of LEs but allowed therapist to place pillow between knees.  Verbally reviewed use of +2 assist and/or hoyer use for safety of transfers, but reinforced that hospice will have specific equipment they will use/train with patient/family. Family questioned about equipment to promote self feeding, reviewed options and positioning. Family voiced understanding of recommendations, thanked therapist for education, and had no further questions.  At this time, OT will sign off and further needs can be assessed at home by hospice.    Follow Up Recommendations  Other (comment);No OT follow up;Supervision/Assistance - 24 hour(hospice care )    Equipment Recommendations  None recommended by OT    Recommendations for Other Services      Precautions / Restrictions Precautions Precautions: Fall       Mobility Bed Mobility Overal bed mobility: Needs Assistance             General bed mobility comments: patient sidelying in bed and discussed positioning for comfort, skin breakdown prevention and contracture prevention.   Transfers                 General  transfer comment: reviewed safety with +2 assist transfers at home, educated on hospice to educated family on transfer recommendations with their eqipment    Balance                                           ADL either performed or assessed with clinical judgement   ADL Overall ADL's : Needs assistance/impaired   Eating/Feeding Details (indicate cue type and reason): reviewed safety with positioning and feeding once dc'd home to place hospital bed in chair position  Grooming: Total assistance                         Toileting - Clothing Manipulation Details (indicate cue type and reason): reviewed safety and use of bed pan at this time, rather then [facility]     Functional mobility during ADLs: Total assistance General ADL Comments: Pt sidelying in bed, fetal position.  Reviewed positioning with sister and daughter, placement of supports to prevent pressure areas, promote comfort and prevent contractures and skin breakdown.  Patient moaned with ROM of UEs but allowed, extension positions; placed pillow between knees but patient resisted further movement.      Vision       Perception     Praxis      Cognition Arousal/Alertness: Lethargic;Awake/alert Behavior During Therapy: Anxious;Flat affect Overall Cognitive Status: History of cognitive impairments - at baseline  Exercises     Shoulder Instructions       General Comments      Pertinent Vitals/ Pain       Pain Assessment: Faces Faces Pain Scale: Hurts even more Pain Location: assuming neck per daughter  Pain Descriptors / Indicators: Crying;Moaning Pain Intervention(s): Repositioned;Monitored during session  Home Living                                          Prior Functioning/Environment              Frequency  Min 2X/week        Progress Toward Goals  OT Goals(current goals can now be found in the  care plan section)  Progress towards OT goals: Goals met/education completed, patient discharged from OT;Not progressing toward goals - comment(pt dc plan changed to hospice care )  Acute Rehab OT Goals Patient Stated Goal: home with hospice  OT Goal Formulation: With family Time For Goal Achievement: 07/26/18 Potential to Achieve Goals: [place] Discharge plan needs to be updated    Co-evaluation                 AM-PAC PT "6 Clicks" Daily Activity     Outcome Measure   Help from another person eating meals?: Total Help from another person taking care of personal grooming?: Total Help from another person toileting, which includes using toliet, bedpan, or urinal?: Total Help from another person bathing (including washing, rinsing, drying)?: Total Help from another person to put on and taking off regular upper body clothing?: Total Help from another person to put on and taking off regular lower body clothing?: Total 6 Click Score: 6    End of Session    OT Visit Diagnosis: Muscle weakness (generalized) (M62.81);Other symptoms and signs involving cognitive function;Pain;Cognitive communication deficit (R41.841) Pain - part of body: (neck)   Activity Tolerance Patient limited by pain;Patient limited by fatigue   Patient Left in bed;with call bell/phone within reach;with family/visitor present   Nurse Communication Mobility status        Time: 0737-1062 OT Time Calculation (min): 18 min  Charges: OT General Charges $OT Visit: 1 Visit OT Treatments $Therapeutic Activity: 8-22 mins  Delight Stare, OT Acute Rehabilitation Services Pager [phone] Office [phone]    Delight Stare 07/15/2018, 4:11 PM

## 2018-07-15 NOTE — Discharge Summary (Signed)
[facility]  Physician Discharge Summary   Patient ID: ALEASE Haynes MRN: [id number] DOB/AGE: 1927/03/21 84 y.o.  Admit date: 07/10/2018 Discharge date: 07/15/2018  PCP: Kathleen Gravel, MD  DISCHARGE DIAGNOSES:  Right-sided hemiparesis, suspected Acute CVA Dementia Parkinson's disease Severe protein calorie malnutrition  RECOMMENDATIONS FOR OUTPATIENT FOLLOW UP: 1. Patient being discharged home with hospice   DISCHARGE CONDITION: poor  Diet recommendation: Comfort feeds as tolerated  Filed Weights   07/10/18 1059 07/10/18 2007  Weight: 48 kg 44.6 kg    INITIAL HISTORY: 84 y.o.femalewith medical history significant forParkinson's disease, dementia, hypertension, hyperlipidemia, protein calorie malnutrition,who was brought to the ED by EMS afterher husband becameconcerned that patient was acting like she was in more pain than usual, moaning and crying more, and also noticed that she seemed to beexhibiting right sided upper extremity weakness and washaving trouble sitting upright (per son leans to the left chronically and this was no different today except that she seemed to be leaning more drastically).  Patient was found to have abnormal UA.  There was also concern for an acute stroke.  She was hospitalized for further management.    Reason for Visit: Acute metabolic encephalopathy.  Right-sided hemiparesis and suspected CVA  Consultants: Neurology.  Palliative medicine.  Procedures:   Transthoracic echocardiogram Study Conclusions  - Left ventricle: The cavity size was normal. There was mild focal basal hypertrophy of the septum. Systolic function was normal. The estimated ejection fraction was in the range of 60% to 65%. Wall motion was normal; there were no regional wall motion abnormalities. Doppler parameters are consistent with abnormal left ventricular relaxation (grade 1 diastolic dysfunction). Doppler parameters are consistent  with high ventricular filling pressure. - Aortic valve: There was mild regurgitation.  Impressions:  - Normal LV systolic function; mild diastolic dysfunction; mild AI.   HOSPITAL COURSE:   Right-sided weakness/suspected acute CVA, nonhemorrhagic CT scan did not show any acute findings.  It appears that patient cannot get MRI as she reportedly has some metal implants in her ear.  Neurology was consulted.  Patient underwent CT angiogram head and neck which did not show any acute findings.  Echocardiogram report is as above.  No significant abnormalities noted.  No significant stenosis on carotid Dopplers.  Patient remains on aspirin and statin although her oral intake has been very poor.  Seen by PT and OT and speech therapy.  LDL is 66.  HbA1c 4.9.  TSH 2.69.  Patient has multiple underlying health issues with poor quality of life at baseline and poor functioning at baseline.  Palliative medicine was consulted.  Family was initially reluctant to consider hospice.  However patient did not improve and actually noted to be declining with poor oral intake and hypoglycemic episodes.  Family now more amenable to hospice.    Patient seen by the hospice coordinator.  Will be discharged home with hospice services today. .   Acute cystitis UA noted to be abnormal.  Patient started on ceftriaxone.  Urine cultures with multiple species.  Discontinue ceftriaxone.  She has completed 5 days of treatment.  Hypoglycemia Hypoglycemic episodes most likely due to very poor oral intake.  This appears to be a very poor prognostic factor.  Patient had to be started on D5 infusion due to persistent hypoglycemia.    Essential hypertension Blood pressure reasonably well controlled.     History of Parkinson's disease with dementia Patient apparently has poor functioning at baseline.  She is for the most part bedridden but does  get around in a wheelchair with assistance from her husband.  She is on Sinemet  Aricept and memantine which is being continued.    Hyperlipidemia LDL 66.    Goals of care Patient appears to have a poor functioning at baseline with possibly poor quality of life.  She has chronic neurological disorders which are progressive in nature.  Patient continues to decline.  She is not eating and as a result has hypoglycemic episodes.  Family now more amenable to hospice.  However her husband wants to take her home.   Overall stable.  Prognosis is poor.  Okay for discharge home with hospice.     PERTINENT LABS:  The results of significant diagnostics from this hospitalization (including imaging, microbiology, ancillary and laboratory) are listed below for reference.    Microbiology: Recent Results (from the past 240 hour(s))  Urine culture     Status: Abnormal   Collection Time: 07/10/18  1:42 PM  Result Value Ref Range Status   Specimen Description URINE, CATHETERIZED  Final   Special Requests   Final    NONE Performed at [facility], [address], [place], [place] [address]    Culture (A)  Final    >=100,000 COLONIES/mL MULTIPLE SPECIES PRESENT, SUGGEST RECOLLECTION   Report Status 07/12/2018 FINAL  Final     Labs: Basic Metabolic Panel: Recent Labs  Lab 07/10/18 1031 07/10/18 1035 07/11/18 0421 07/12/18 0342 07/13/18 0437 07/14/18 0444  NA 142 143 143 140 139 139  K 3.8 3.9 3.4* 3.5 3.4* 3.8  CL 108 107 108 109 110 108  CO2 22  --  22 19* 21* 19*  GLUCOSE 130* 127* 73 58* 72 73  BUN 23 25* 25* 20 14 11   CREATININE 1.09* 1.00 1.14* 1.15* 0.83 1.09*  CALCIUM 9.6  --  9.0 8.7* 8.7* 8.9   Liver Function Tests: Recent Labs  Lab 07/10/18 1031  AST 43*  ALT 17  ALKPHOS 72  BILITOT 0.8  PROT 7.3  ALBUMIN 3.9   CBC: Recent Labs  Lab 07/10/18 1031 07/10/18 1035 07/11/18 0421  WBC 7.1  --  6.4  NEUTROABS 3.2  --   --   HGB 14.2 14.3 11.9*  HCT 44.5 42.0 37.7  MCV 94.5  --  92.9  PLT 241  --  231    CBG: Recent Labs  Lab  07/13/18 1629 07/14/18 0614 07/14/18 1116 07/14/18 1237 07/15/18 0653  GLUCAP 62* 71 69* 136* 107*     IMAGING STUDIES Ct Angio Head W Or Wo Contrast  Result Date: 07/11/2018 CLINICAL DATA:  Followup code stroke. Dementia. Parkinson's disease. Right-sided upper extremity weakness. EXAM: CT ANGIOGRAPHY HEAD AND NECK TECHNIQUE: Multidetector CT imaging of the head and neck was performed using the standard protocol during bolus administration of intravenous contrast. Multiplanar CT image reconstructions and MIPs were obtained to evaluate the vascular anatomy. Carotid stenosis measurements (when applicable) are obtained utilizing NASCET criteria, using the distal internal carotid diameter as the denominator. CONTRAST:  175mL ISOVUE-370 IOPAMIDOL (ISOVUE-370) INJECTION 76% COMPARISON:  Head CT yesterday FINDINGS: CT HEAD FINDINGS Brain: No appreciable change since yesterday. Generalized atrophy. Old inferior cerebellar infarction on the right. Old right frontal cortical and subcortical infarction. Chronic small-vessel ischemic changes of the white matter. Vascular: There is atherosclerotic calcification of the major vessels at the base of the brain. Skull: Negative Sinuses: Clear Orbits: Negative Review of the MIP images confirms the above findings CTA NECK FINDINGS Aortic arch: Aortic atherosclerosis. Branching  pattern is normal without flow limiting origin stenosis. 30% narrowing of the left subclavian origin. Right carotid system: Common carotid artery is tortuous but widely patent to the bifurcation. There is atherosclerotic plaque at the carotid bifurcation and ICA bulb. Minimal diameter of the proximal ICA is 3 mm. Compared to a more distal cervical ICA diameter of 4 mm, this indicates a 25% stenosis. Left carotid system: Common carotid artery is widely patent to the bifurcation. There is calcified plaque at the carotid bifurcation and ICA bulb. Minimal diameter of the ICA bulb is 4 mm. Compared to a  more distal cervical ICA diameter of 4 mm, there is no stenosis. Vertebral arteries: Calcified plaque at the right vertebral artery origin but without origin stenosis. Beyond that, the vessel becomes narrow and appears to be occluded in the lower cervical region. The left vertebral artery shows wide patency at its origin. The vessel is patent through the cervical region to the foramen magnum. The distal right vertebral artery is reconstituted by cervical collaterals at the C1 level as a robust vessel. There is 50% stenosis as the vessel penetrates the dura at the foramen magnum. Skeleton: Mild degenerative spondylosis. 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 This is unchanged from previous studies. Other neck: No mass or lymphadenopathy. Upper chest: Normal Review of the MIP images confirms the above findings CTA HEAD FINDINGS Anterior circulation: Both internal carotid arteries are widely patent through the skull base and siphon regions. No stenosis. The anterior and middle cerebral vessels are patent without evidence of proximal stenosis or aneurysm. No occluded branch vessels are identified. Posterior circulation: Both vertebral arteries contribute to the basilar. No basilar stenosis. Posterior circulation branch vessels are patent. Venous sinuses: Patent and normal. Anatomic variants: None significant. Delayed phase: No abnormal enhancement. Review of the MIP images confirms the above findings IMPRESSION: No acute vascular finding. Atherosclerotic change at both carotid bifurcations and ICA bulbs. Despite the presence of atherosclerosis, there is no flow limiting stenosis. 25% narrowing of the ICA bulb on the right. No stenosis on the left. No intracranial anterior circulation large or medium vessel abnormality. Dominant left vertebral artery widely patent. Non dominant right vertebral artery is occluded in the lower cervical region and reconstituted in the upper cervical  region by cervical collaterals. No intracranial posterior circulation large or medium vessel abnormality presently. Electronically Signed   By: Nelson Chimes M.D.   On: 07/11/2018 14:17   Dg Chest 2 View  Result Date: 07/10/2018 CLINICAL DATA:  Pt arrives to ED from home with complaints of right sided weakness, R sided facial droop, and being mute since 1900 11/13. EMS reports that patient has history of parkinson's and dementia EXAM: CHEST - 2 VIEW COMPARISON:  11/30/2015 FINDINGS: Cardiac silhouette is mildly enlarged. No mediastinal or hilar masses. No convincing adenopathy. Lungs are clear.  No pleural effusion or pneumothorax. Skeletal structures are demineralized. There are multiple old healed rib fractures. IMPRESSION: No acute cardiopulmonary disease. Electronically Signed   By: Lajean Manes M.D.   On: 07/10/2018 13:18   Ct Angio Neck W Or Wo Contrast  Result Date: 07/11/2018 CLINICAL DATA:  Followup code stroke. Dementia. Parkinson's disease. Right-sided upper extremity weakness. EXAM: CT ANGIOGRAPHY HEAD AND NECK TECHNIQUE: Multidetector CT imaging of the head and neck was performed using the standard protocol during bolus administration of intravenous contrast. Multiplanar CT image reconstructions and MIPs were obtained to evaluate the vascular anatomy. Carotid stenosis measurements (when applicable) are obtained  utilizing NASCET criteria, using the distal internal carotid diameter as the denominator. CONTRAST:  142mL ISOVUE-370 IOPAMIDOL (ISOVUE-370) INJECTION 76% COMPARISON:  Head CT yesterday FINDINGS: CT HEAD FINDINGS Brain: No appreciable change since yesterday. Generalized atrophy. Old inferior cerebellar infarction on the right. Old right frontal cortical and subcortical infarction. Chronic small-vessel ischemic changes of the white matter. Vascular: There is atherosclerotic calcification of the major vessels at the base of the brain. Skull: Negative Sinuses: Clear Orbits: Negative Review  of the MIP images confirms the above findings CTA NECK FINDINGS Aortic arch: Aortic atherosclerosis. Branching pattern is normal without flow limiting origin stenosis. 30% narrowing of the left subclavian origin. Right carotid system: Common carotid artery is tortuous but widely patent to the bifurcation. There is atherosclerotic plaque at the carotid bifurcation and ICA bulb. Minimal diameter of the proximal ICA is 3 mm. Compared to a more distal cervical ICA diameter of 4 mm, this indicates a 25% stenosis. Left carotid system: Common carotid artery is widely patent to the bifurcation. There is calcified plaque at the carotid bifurcation and ICA bulb. Minimal diameter of the ICA bulb is 4 mm. Compared to a more distal cervical ICA diameter of 4 mm, there is no stenosis. Vertebral arteries: Calcified plaque at the right vertebral artery origin but without origin stenosis. Beyond that, the vessel becomes narrow and appears to be occluded in the lower cervical region. The left vertebral artery shows wide patency at its origin. The vessel is patent through the cervical region to the foramen magnum. The distal right vertebral artery is reconstituted by cervical collaterals at the C1 level as a robust vessel. There is 50% stenosis as the vessel penetrates the dura at the foramen magnum. Skeleton: Mild degenerative spondylosis. 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 This is unchanged from previous studies. Other neck: No mass or lymphadenopathy. Upper chest: Normal Review of the MIP images confirms the above findings CTA HEAD FINDINGS Anterior circulation: Both internal carotid arteries are widely patent through the skull base and siphon regions. No stenosis. The anterior and middle cerebral vessels are patent without evidence of proximal stenosis or aneurysm. No occluded branch vessels are identified. Posterior circulation: Both vertebral arteries contribute to the basilar. No  basilar stenosis. Posterior circulation branch vessels are patent. Venous sinuses: Patent and normal. Anatomic variants: None significant. Delayed phase: No abnormal enhancement. Review of the MIP images confirms the above findings IMPRESSION: No acute vascular finding. Atherosclerotic change at both carotid bifurcations and ICA bulbs. Despite the presence of atherosclerosis, there is no flow limiting stenosis. 25% narrowing of the ICA bulb on the right. No stenosis on the left. No intracranial anterior circulation large or medium vessel abnormality. Dominant left vertebral artery widely patent. Non dominant right vertebral artery is occluded in the lower cervical region and reconstituted in the upper cervical region by cervical collaterals. No intracranial posterior circulation large or medium vessel abnormality presently. Electronically Signed   By: Nelson Chimes M.D.   On: 07/11/2018 14:17   Ct Head Code Stroke Wo Contrast  Result Date: 07/10/2018 CLINICAL DATA:  Code stroke. Right-sided weakness. Altered level of consciousness. EXAM: CT HEAD WITHOUT CONTRAST TECHNIQUE: Contiguous axial images were obtained from the base of the skull through the vertex without intravenous contrast. COMPARISON:  CT head 11/30/2015 FINDINGS: Brain: Moderate atrophy. Chronic encephalomalacia inferior frontal lobe bilaterally is unchanged. Patchy white matter hypodensity unchanged. Chronic infarct in the right cerebellum unchanged. Negative for acute infarct, hemorrhage, or mass.  Vascular: Negative for hyperdense vessel Skull: No focal skull lesion. Chronic fracture of the dens. Progressive erosion of the tip of the dens with progressive basilar invagination. Progressive stenosis at the foramen magnum. Sinuses/Orbits: Paranasal sinuses clear. Bilateral cataract surgery. Other: None ASPECTS ([place] Stroke Program Early CT Score) - Ganglionic level infarction (caudate, lentiform nuclei, internal capsule, insula, M1-M3 cortex): 7 -  Supraganglionic infarction (M4-M6 cortex): 3 Total score (0-10 with 10 being normal): 10 IMPRESSION: 1. No acute intracranial abnormality 2. Atrophy and chronic ischemic changes are stable from the prior CT. 3. Chronic fracture of the dens with progressive erosion of the dens and progressive basilar invagination. Moderate stenosis at the foramen magnum. 4. ASPECTS is 10 Electronically Signed   By: Franchot Gallo M.D.   On: 07/10/2018 10:47   Vas US Carotid  Result Date: 07/11/2018 Carotid Arterial Duplex Study Indications:   CVA and Weakness. Right arm Risk Factors:  Hypertension, hyperlipidemia. Other Factors: Parkinsons, dementia. Performing Technologist: Toma Copier RVS  Examination Guidelines: A complete evaluation includes B-mode imaging, spectral Doppler, color Doppler, and power Doppler as needed of all accessible portions of each vessel. Bilateral testing is considered an integral part of a complete examination. Limited examinations for reoccurring indications may be performed as noted.  Right Carotid Findings: +----------+--------+--------+--------+--------------------+-------------------+           PSV cm/sEDV cm/sStenosisDescribe            Comments            +----------+--------+--------+--------+--------------------+-------------------+ CCA Prox  87      17                                  mild intimal                                                              changes             +----------+--------+--------+--------+--------------------+-------------------+ CCA Distal57      13                                  mild intimal                                                              changes             +----------+--------+--------+--------+--------------------+-------------------+ ICA Prox  49      15              diffuse and         mild plaque                                           heterogenous                             +----------+--------+--------+--------+--------------------+-------------------+  ICA Mid   58      15                                                      +----------+--------+--------+--------+--------------------+-------------------+ ICA Distal86      28                                  tortuous            +----------+--------+--------+--------+--------------------+-------------------+ ECA       46      2                                   intimal thickening  +----------+--------+--------+--------+--------------------+-------------------+ +----------+--------+-------+--------+-------------------+           PSV cm/sEDV cmsDescribeArm Pressure (mmHG) +----------+--------+-------+--------+-------------------+ [id number]     0                                  +----------+--------+-------+--------+-------------------+ +---------+--------+--+--------+-+ VertebralPSV cm/s20EDV cm/s4 +---------+--------+--+--------+-+  Left Carotid Findings: +----------+--------+--------+--------+------------+--------------------+           PSV cm/sEDV cm/sStenosisDescribe    Comments             +----------+--------+--------+--------+------------+--------------------+ CCA Prox  39      8                           mild intimal changes +----------+--------+--------+--------+------------+--------------------+ CCA Distal50      9                           mild intimal changes +----------+--------+--------+--------+------------+--------------------+ ICA Prox  56      15              heterogenousmild plaque          +----------+--------+--------+--------+------------+--------------------+ ICA Mid   112     36              heterogenousmild plaque          +----------+--------+--------+--------+------------+--------------------+ ICA Distal70      23                          tortuous              +----------+--------+--------+--------+------------+--------------------+ ECA       46      7               heterogenousmild plaque          +----------+--------+--------+--------+------------+--------------------+ +----------+--------+--------+--------+-------------------+ SubclavianPSV cm/sEDV cm/sDescribeArm Pressure (mmHG) +----------+--------+--------+--------+-------------------+           79                                          +----------+--------+--------+--------+-------------------+ +---------+--------+--+--------+-+ VertebralPSV cm/s29EDV cm/s8 +---------+--------+--+--------+-+  Summary: Right Carotid: Velocities in the right ICA are consistent with a 1-39% stenosis. Left Carotid: Velocities in the left ICA are consistent with a 1-39% stenosis. Vertebrals:  Bilateral  vertebral arteries demonstrate antegrade flow. Subclavians: Normal flow hemodynamics were seen in bilateral subclavian              arteries. *See table(s) above for measurements and observations.  Electronically signed by Antony Contras MD on 07/11/2018 at 2:47:51 PM.    Final     DISCHARGE EXAMINATION: See progress note from earlier today  DISPOSITION: Home with hospice  Discharge Instructions    Discharge instructions   Complete by:  As directed    You were cared for by a hospitalist during your hospital stay. If you have any questions about your discharge medications or the care you received while you were in the hospital after you are discharged, you can call the unit and asked to speak with the hospitalist on call if the hospitalist that took care of you is not available. Once you are discharged, your primary care physician will handle any further medical issues. Please note that NO REFILLS for any discharge medications will be authorized once you are discharged, as it is imperative that you return to your primary care physician (or establish a relationship with a primary care physician if you do  not have one) for your aftercare needs so that they can reassess your need for medications and monitor your lab values. If you do not have a primary care physician, you can call [phone] for a physician referral.   Increase activity slowly   Complete by:  As directed         Allergies as of 07/15/2018   No Known Allergies     Medication List    STOP taking these medications   atenolol-chlorthalidone 50-25 MG tablet Commonly known as:  TENORETIC   carbidopa-levodopa 25-100 MG tablet Commonly known as:  SINEMET IR   potassium chloride SA 20 MEQ tablet Commonly known as:  K-DUR,KLOR-CON   pravastatin 80 MG tablet Commonly known as:  PRAVACHOL   traMADol 50 MG tablet Commonly known as:  ULTRAM     TAKE these medications   donepezil 5 MG tablet Commonly known as:  ARICEPT TAKE 1 TABLET BY MOUTH AT  BEDTIME   LORazepam 2 MG/ML concentrated solution Commonly known as:  ATIVAN Take 0.5 mLs (1 mg total) by mouth every 6 (six) hours as needed for anxiety.   memantine 14 MG Cp24 24 hr capsule Commonly known as:  NAMENDA XR TAKE 1 CAPSULE BY MOUTH  DAILY What changed:  when to take this   morphine CONCENTRATE 10 mg / 0.5 ml concentrated solution Place 0.25-0.5 mLs (5-10 mg total) under the tongue every 2 (two) hours as needed for severe pain.   polyethylene glycol packet Commonly known as:  MIRALAX / GLYCOLAX Take 17 g by mouth 2 (two) times daily. What changed:  when to take this   VESICARE 5 MG tablet Generic drug:  solifenacin Take 5 mg by mouth every morning.          TOTAL DISCHARGE TIME: 35 minutes  Bonnielee Haff  [facility] Pager [phone]  07/15/2018, 3:33 PM

## 2018-07-15 NOTE — Plan of Care (Signed)
Max assist 

## 2018-07-15 NOTE — Care Management Note (Addendum)
Case Management Note  Patient Details  Name: SHANIELLE CORRELL MRN: [id number] Date of Birth: June 28, 1927  Subjective/Objective:                    Action/Plan: Plan is for home with hospice. CM met with patients daughter and they would like to use [facility]. CM called Bevely Palmer with [facility]. Family is requesting lift for home. They have bed and wheelchair already.  CM following.  1610 pm: Addendum:: pt has received authorization for home hospice. CM updated family and bedside RN. Family requesting [facility] home. CM called and arranged for transport. Bedside RN updated and form placed at desk for Shannon.  Expected Discharge Date:  07/15/18               Expected Discharge Plan:  Home w Hospice Care  In-House Referral:     Discharge planning Services  CM Consult  Post Acute Care Choice:    Choice offered to:     DME Arranged:    DME Agency:     HH Arranged:    [place] Agency:     Status of Service:  In process, will continue to follow  If discussed at Long Length of Stay Meetings, dates discussed:    Additional Comments:  Pollie Friar, RN 07/15/2018, 10:50 AM

## 2018-07-15 NOTE — Progress Notes (Signed)
[facility] PROGRESS NOTE  Kathleen Haynes ATF:[id number] DOB: 1927-05-05 DOA: 07/10/2018  PCP: [name], MD  Brief History/Interval Summary: 84 y.o. female with medical history significant for Parkinson's disease, dementia, hypertension, hyperlipidemia, protein calorie malnutrition, who was brought to the ED by EMS after her husband became concerned that patient was acting like she was in more pain than usual, moaning and crying more, and also noticed that she seemed to be exhibiting right sided upper extremity weakness and was having trouble sitting upright (per son leans to the left chronically and this was no different today except that she seemed to be leaning more drastically).  Patient was found to have abnormal UA.  There was also concern for an acute stroke.  She was hospitalized for further management.    Reason for Visit: Acute metabolic encephalopathy.  Right-sided hemiparesis and suspected CVA  Consultants: Neurology.  Palliative medicine.  Procedures:   Transthoracic echocardiogram Study Conclusions  - Left ventricle: The cavity size was normal. There was mild focal   basal hypertrophy of the septum. Systolic function was normal.   The estimated ejection fraction was in the range of 60% to 65%.   Wall motion was normal; there were no regional wall motion   abnormalities. Doppler parameters are consistent with abnormal   left ventricular relaxation (grade 1 diastolic dysfunction).   Doppler parameters are consistent with high ventricular filling   pressure. - Aortic valve: There was mild regurgitation.  Impressions:  - Normal LV systolic function; mild diastolic dysfunction; mild AI.  Antibiotics: Ceftriaxone stopped on 11/18  Subjective/Interval History: Patient in a curled up position.  Opens her eyes but goes right back to sleep.  Does not communicate.  Her daughter is at the bedside.   Objective:  Vital Signs  Vitals:   07/15/18 0025  07/15/18 0357 07/15/18 0807 07/15/18 1146  BP: 128/73 123/66 (!) 129/94 117/66  Pulse: 82 88 (!) 101 98  Resp: 18 16 20 16   Temp: 98.8 F (37.1 C) 98.3 F (36.8 C) 98.3 F (36.8 C) 99.3 F (37.4 C)  TempSrc:  Axillary Oral Oral  SpO2: 97% 97% 99% 96%  Weight:      Height:        Intake/Output Summary (Last 24 hours) at 07/15/2018 1247 Last data filed at 07/15/2018 0620 Gross per 24 hour  Intake 1247.83 ml  Output 300 ml  Net 947.83 ml   Filed Weights   07/10/18 1059 07/10/18 2007  Weight: 48 kg 44.6 kg    General appearance: Somnolent but arousable. Resp: Appears to have normal effort at rest.  Clear to auscultation Cardio: S1-S2 is normal regular.  No S3-S4.  No rubs murmurs or bruit GI:.  Soft.  Nontender Neurologic: Somnolent but arousable.  Lab Results:  Data Reviewed: I have personally reviewed following labs and imaging studies  CBC: Recent Labs  Lab 07/10/18 1031 07/10/18 1035 07/11/18 0421  WBC 7.1  --  6.4  NEUTROABS 3.2  --   --   HGB 14.2 14.3 11.9*  HCT 44.5 42.0 37.7  MCV 94.5  --  92.9  PLT 241  --  270    Basic Metabolic Panel: Recent Labs  Lab 07/10/18 1031 07/10/18 1035 07/11/18 0421 07/12/18 0342 07/13/18 0437 07/14/18 0444  NA 142 143 143 140 139 139  K 3.8 3.9 3.4* 3.5 3.4* 3.8  CL 108 107 108 109 110 108  CO2 22  --  22 19* 21* 19*  GLUCOSE 130* 127*  73 58* 72 73  BUN 23 25* 25* 20 14 11   CREATININE 1.09* 1.00 1.14* 1.15* 0.83 1.09*  CALCIUM 9.6  --  9.0 8.7* 8.7* 8.9    GFR: Estimated Creatinine Clearance: 25.1 mL/min (A) (by C-G formula based on SCr of 1.09 mg/dL (H)).  Liver Function Tests: Recent Labs  Lab 07/10/18 1031  AST 43*  ALT 17  ALKPHOS 72  BILITOT 0.8  PROT 7.3  ALBUMIN 3.9     Coagulation Profile: Recent Labs  Lab 07/10/18 1031  INR 1.02    HbA1C: No results for input(s): HGBA1C in the last 72 hours.  CBG: Recent Labs  Lab 07/13/18 1629 07/14/18 0614 07/14/18 1116 07/14/18 1237  07/15/18 0653  GLUCAP 62* 71 69* 136* 107*     Recent Results (from the past 240 hour(s))  Urine culture     Status: Abnormal   Collection Time: 07/10/18  1:42 PM  Result Value Ref Range Status   Specimen Description URINE, CATHETERIZED  Final   Special Requests   Final    NONE Performed at [facility], [place] [address] [place], [place] [address]    Culture (A)  Final    >=100,000 COLONIES/mL MULTIPLE SPECIES PRESENT, SUGGEST RECOLLECTION   Report Status 07/12/2018 FINAL  Final      Radiology Studies: No results found.   Medications:  Scheduled: .  stroke: mapping our early stages of recovery book   Does not apply Once  . aspirin  81 mg Oral Daily  . carbidopa-levodopa  1 tablet Oral BID  . darifenacin  7.5 mg Oral Daily  . donepezil  5 mg Oral QHS  . enoxaparin (LOVENOX) injection  30 mg Subcutaneous Q24H  . memantine  14 mg Oral BH-q7a  . polyethylene glycol  17 g Oral Daily  . potassium chloride SA  20 mEq Oral Daily  . pravastatin  80 mg Oral BH-q7a   Continuous: . sodium chloride    . sodium chloride 50 mL/hr at 07/13/18 2154  . cefTRIAXone (ROCEPHIN)  IV 1 g (07/14/18 1419)  . dextrose 75 mL/hr at 07/14/18 1221   PYP:[id number] **OR** acetaminophen (TYLENOL) oral liquid 160 mg/5 mL **OR** acetaminophen, senna-docusate, sodium phosphate    Assessment/Plan:  Right-sided weakness/suspected acute CVA, nonhemorrhagic CT scan did not show any acute findings.  It appears that patient cannot get MRI as she reportedly has some metal implants in her ear.  Neurology was consulted.  Patient underwent CT angiogram head and neck which did not show any acute findings.  Echocardiogram report is as above.  No significant abnormalities noted.  No significant stenosis on carotid Dopplers.  Patient remains on aspirin and statin although her oral intake has been very poor.  Seen by PT and OT and speech therapy.  LDL is 66.  HbA1c 4.9.  TSH 2.69.  Patient has multiple  underlying health issues with poor quality of life at baseline and poor functioning at baseline.  Palliative medicine was consulted.  Family was initially reluctant to consider hospice.  However patient did not improve and actually noted to be declining with poor oral intake and hypoglycemic episodes.  Family now more amenable to hospice.  Referral has been sent to hospice of [place].   Acute cystitis UA noted to be abnormal.  Patient started on ceftriaxone.  Urine cultures with multiple species.  Discontinue ceftriaxone.  She has completed 5 days of treatment.  Hypoglycemia Hypoglycemic episodes most likely due to very poor oral intake.  This appears to be a very poor prognostic factor.  Patient had to be started on D5 infusion due to persistent hypoglycemia.    Essential hypertension Blood pressure reasonably well controlled.  Continue to monitor.  Potassium normal.    History of Parkinson's disease with dementia Patient apparently has poor functioning at baseline.  She is for the most part bedridden but does get around in a wheelchair with assistance from her husband.  She is on Sinemet Aricept and memantine which is being continued.    Hyperlipidemia LDL 66.    Goals of care Patient appears to have a poor functioning at baseline with possibly poor quality of life.  She has chronic neurological disorders which are progressive in nature.  Patient continues to decline.  She is not eating and as a result has hypoglycemic episodes.  Family now more amenable to hospice.  However her husband wants to take her home.  Referral sent to hospice of [place].     DVT Prophylaxis: Lovenox    Code Status: DNR Family Communication: Discussed with patient's daughter Disposition Plan: Management as outlined above.    LOS: 5 days   [facility] Pager [phone] 07/15/2018, 12:47 PM  If 7PM-7AM, please contact night-coverage at [email or web address], password [facility]

## 2018-07-19 ENCOUNTER — Other Ambulatory Visit: Payer: Self-pay | Admitting: Neurology

## 2018-07-19 DIAGNOSIS — R443 Hallucinations, unspecified: Secondary | ICD-10-CM

## 2018-07-19 DIAGNOSIS — F0280 Dementia in other diseases classified elsewhere without behavioral disturbance: Secondary | ICD-10-CM

## 2018-07-19 DIAGNOSIS — G20 Parkinson's disease: Secondary | ICD-10-CM

## 2018-07-19 DIAGNOSIS — G4752 REM sleep behavior disorder: Secondary | ICD-10-CM

## 2018-07-19 DIAGNOSIS — G894 Chronic pain syndrome: Secondary | ICD-10-CM | POA: Diagnosis not present

## 2018-07-19 DIAGNOSIS — K5909 Other constipation: Secondary | ICD-10-CM

## 2018-07-19 DIAGNOSIS — R413 Other amnesia: Secondary | ICD-10-CM

## 2018-07-19 DIAGNOSIS — I10 Essential (primary) hypertension: Secondary | ICD-10-CM | POA: Diagnosis not present

## 2018-07-19 DIAGNOSIS — Z0000 Encounter for general adult medical examination without abnormal findings: Secondary | ICD-10-CM | POA: Diagnosis not present

## 2018-07-19 DIAGNOSIS — R54 Age-related physical debility: Secondary | ICD-10-CM

## 2018-07-22 ENCOUNTER — Other Ambulatory Visit: Payer: Self-pay | Admitting: *Deleted

## 2018-07-22 NOTE — Patient Outreach (Addendum)
[facility]) Care Management  07/22/2018  Kathleen Haynes 03-30-1927 [id number]   EMMI-stroke  RED ON EMMI ALERT Day # 3 Date: 07/19/18 Friday 1003  Red Alert Reason: Feeling worse overall? yes  Insurance:  [employer] medicare  [facility] admissions  ED visits in the last 6 months   Outreach attempt # 1 No answer at her home number nor the DPR -daughter, [name]'s home number . [facility] RN CM left HIPAA compliant voicemail messages along with CM's contact info at both numbers.   Plan: [facility] RN CM sent an unsuccessful outreach letter and scheduled this patient for another call attempt within 4 business days  Leopoldo Mazzie L. Lavina Hamman, RN, BSN, [place] Coordinator Office number [phone] Mobile number [phone]  Main [facility] number [phone] Fax number [phone]

## 2018-07-23 ENCOUNTER — Ambulatory Visit: Payer: Self-pay | Admitting: *Deleted

## 2018-07-24 ENCOUNTER — Other Ambulatory Visit: Payer: Self-pay | Admitting: *Deleted

## 2018-07-24 NOTE — Patient Outreach (Signed)
[facility]) Care Management  07/24/2018  EDIT RICCIARDELLI 03-09-1927 [id number]   EMMI-stroke  RED ON EMMI ALERT Day # 3 Date: 07/19/18 Friday 1003  Red Alert Reason: Feeling worse overall? yes  Insurance:  [employer] medicare  [facility] admissions  ED visits in the last 6 months   Outreach attempt # 2 successful with her husband, [name]  Mr [name] is able to verify HIPAA Reviewed and addressed EMMI red alert/referral to [facility] with Mr [name]  Mr [name] reports Mrs Brackeen is not longer able to speak loud and clear.  He reports she is remaining the same and has not become any worst since leaving the hospital as confirmed by the home health RN visiting today 07/24/18 Mr Aracena reports she does more sleeping now and was discharged home with hospice services of [facility] on 07/15/18    Social: Mrs Sing is living at home with her husband, [name] She is receiving home health visits. Mrs Aziz is dependent with all her care    Conditions: stroke, history of hip fracture,Parkinson, dementia, hx of falls, HTN, sleep apnea, right sided weakness hx of UTI, somnolence,    Medications: denies concerns with taking medications as prescribed, affording medications, side effects of medications and questions about medications  Appointments: f/u appointments by hospice   Advance Directives: she has a living will   Consent: [facility] reviewed [facility] services with patient. Patient gave verbal consent for services.   Plan: [facility] RN CM will close case at this time as patient has been assessed and no needs identified.   Pt encouraged to return a call to [facility] RN CM prn   Joss Mcdill L. Lavina Hamman, RN, BSN, [place] Coordinator Office number [phone] Mobile number [phone]  Main [facility] number [phone] Fax number [phone]

## 2018-08-01 ENCOUNTER — Other Ambulatory Visit: Payer: Self-pay | Admitting: Neurology

## 2018-08-01 DIAGNOSIS — R413 Other amnesia: Secondary | ICD-10-CM

## 2018-08-01 DIAGNOSIS — G20 Parkinson's disease: Secondary | ICD-10-CM

## 2018-08-01 DIAGNOSIS — R443 Hallucinations, unspecified: Secondary | ICD-10-CM

## 2018-08-01 DIAGNOSIS — R54 Age-related physical debility: Secondary | ICD-10-CM

## 2018-08-01 DIAGNOSIS — F0280 Dementia in other diseases classified elsewhere without behavioral disturbance: Secondary | ICD-10-CM

## 2018-08-01 DIAGNOSIS — G4752 REM sleep behavior disorder: Secondary | ICD-10-CM

## 2018-08-01 DIAGNOSIS — K5909 Other constipation: Secondary | ICD-10-CM

## 2018-08-06 DIAGNOSIS — Z7401 Bed confinement status: Secondary | ICD-10-CM | POA: Diagnosis not present

## 2018-08-06 DIAGNOSIS — I10 Essential (primary) hypertension: Secondary | ICD-10-CM | POA: Diagnosis not present

## 2018-09-28 DEATH — deceased

## 2018-12-26 ENCOUNTER — Telehealth: Payer: Self-pay

## 2018-12-26 NOTE — Telephone Encounter (Signed)
Unable to get in contact with the patient to convert her office visit with Megan on 01/01/2019 to a doxy.me visit. I left a voicemail asking her to return my call. Office number was provided.

## 2018-12-30 NOTE — Telephone Encounter (Signed)
Pts husband returning call- wanting to make the office aware the pt has passed away as of 10/03/2018

## 2019-01-01 ENCOUNTER — Ambulatory Visit: Payer: Medicare Other | Admitting: Adult Health
